# Patient Record
Sex: Female | Born: 1955 | Race: White | Hispanic: No | Marital: Married | Smoking: Never smoker
Health system: Southern US, Community
[De-identification: ages and names within clinical notes are randomized; demographics above are authoritative.]

## PROBLEM LIST (undated history)

## (undated) DIAGNOSIS — I8390 Asymptomatic varicose veins of unspecified lower extremity: Secondary | ICD-10-CM

## (undated) DIAGNOSIS — K5790 Diverticulosis of intestine, part unspecified, without perforation or abscess without bleeding: Secondary | ICD-10-CM

## (undated) DIAGNOSIS — T8859XA Other complications of anesthesia, initial encounter: Secondary | ICD-10-CM

## (undated) DIAGNOSIS — R112 Nausea with vomiting, unspecified: Secondary | ICD-10-CM

## (undated) DIAGNOSIS — F32A Depression, unspecified: Secondary | ICD-10-CM

## (undated) DIAGNOSIS — G4733 Obstructive sleep apnea (adult) (pediatric): Principal | ICD-10-CM

## (undated) DIAGNOSIS — Z9889 Other specified postprocedural states: Secondary | ICD-10-CM

## (undated) DIAGNOSIS — H9319 Tinnitus, unspecified ear: Secondary | ICD-10-CM

## (undated) DIAGNOSIS — M1990 Unspecified osteoarthritis, unspecified site: Secondary | ICD-10-CM

## (undated) DIAGNOSIS — K769 Liver disease, unspecified: Secondary | ICD-10-CM

## (undated) DIAGNOSIS — N3945 Continuous leakage: Secondary | ICD-10-CM

## (undated) DIAGNOSIS — K7581 Nonalcoholic steatohepatitis (NASH): Secondary | ICD-10-CM

## (undated) DIAGNOSIS — F329 Major depressive disorder, single episode, unspecified: Secondary | ICD-10-CM

## (undated) HISTORY — PX: CHOLECYSTECTOMY: SHX55

## (undated) HISTORY — DX: Continuous leakage: N39.45

## (undated) HISTORY — DX: Diverticulosis of intestine, part unspecified, without perforation or abscess without bleeding: K57.90

## (undated) HISTORY — DX: Nonalcoholic steatohepatitis (NASH): K75.81

## (undated) HISTORY — DX: Unspecified osteoarthritis, unspecified site: M19.90

## (undated) HISTORY — PX: BREAST BIOPSY: SHX20

## (undated) HISTORY — PX: OTHER SURGICAL HISTORY: SHX169

## (undated) HISTORY — DX: Tinnitus, unspecified ear: H93.19

## (undated) HISTORY — DX: Major depressive disorder, single episode, unspecified: F32.9

## (undated) HISTORY — DX: Obstructive sleep apnea (adult) (pediatric): G47.33

## (undated) HISTORY — DX: Depression, unspecified: F32.A

## (undated) HISTORY — DX: Asymptomatic varicose veins of unspecified lower extremity: I83.90

---

## 1999-11-13 ENCOUNTER — Other Ambulatory Visit: Admission: RE | Admit: 1999-11-13 | Discharge: 1999-11-13 | Payer: Self-pay | Admitting: Obstetrics & Gynecology

## 2000-12-09 ENCOUNTER — Other Ambulatory Visit: Admission: RE | Admit: 2000-12-09 | Discharge: 2000-12-09 | Payer: Self-pay | Admitting: Obstetrics & Gynecology

## 2001-03-02 ENCOUNTER — Encounter (INDEPENDENT_AMBULATORY_CARE_PROVIDER_SITE_OTHER): Payer: Self-pay | Admitting: Specialist

## 2001-03-02 ENCOUNTER — Encounter (INDEPENDENT_AMBULATORY_CARE_PROVIDER_SITE_OTHER): Payer: Self-pay | Admitting: *Deleted

## 2001-03-02 ENCOUNTER — Ambulatory Visit (HOSPITAL_COMMUNITY): Admission: RE | Admit: 2001-03-02 | Discharge: 2001-03-03 | Payer: Self-pay | Admitting: General Surgery

## 2002-01-10 ENCOUNTER — Other Ambulatory Visit: Admission: RE | Admit: 2002-01-10 | Discharge: 2002-01-10 | Payer: Self-pay | Admitting: Obstetrics & Gynecology

## 2002-03-10 ENCOUNTER — Encounter: Payer: Self-pay | Admitting: Orthopedic Surgery

## 2002-03-10 ENCOUNTER — Ambulatory Visit (HOSPITAL_COMMUNITY): Admission: RE | Admit: 2002-03-10 | Discharge: 2002-03-10 | Payer: Self-pay | Admitting: Orthopedic Surgery

## 2003-02-02 ENCOUNTER — Other Ambulatory Visit: Admission: RE | Admit: 2003-02-02 | Discharge: 2003-02-02 | Payer: Self-pay | Admitting: Obstetrics & Gynecology

## 2004-03-18 ENCOUNTER — Other Ambulatory Visit: Admission: RE | Admit: 2004-03-18 | Discharge: 2004-03-18 | Payer: Self-pay | Admitting: Obstetrics & Gynecology

## 2004-09-17 ENCOUNTER — Ambulatory Visit: Payer: Self-pay | Admitting: Internal Medicine

## 2004-12-04 ENCOUNTER — Ambulatory Visit: Payer: Self-pay | Admitting: Internal Medicine

## 2005-04-24 ENCOUNTER — Ambulatory Visit: Payer: Self-pay | Admitting: Internal Medicine

## 2005-07-01 ENCOUNTER — Ambulatory Visit: Payer: Self-pay | Admitting: Internal Medicine

## 2005-07-01 ENCOUNTER — Other Ambulatory Visit: Admission: RE | Admit: 2005-07-01 | Discharge: 2005-07-01 | Payer: Self-pay | Admitting: Obstetrics & Gynecology

## 2005-07-07 ENCOUNTER — Ambulatory Visit: Payer: Self-pay | Admitting: Internal Medicine

## 2005-08-21 ENCOUNTER — Emergency Department (HOSPITAL_COMMUNITY): Admission: EM | Admit: 2005-08-21 | Discharge: 2005-08-21 | Payer: Self-pay | Admitting: Emergency Medicine

## 2005-10-14 ENCOUNTER — Encounter: Admission: RE | Admit: 2005-10-14 | Discharge: 2005-10-14 | Payer: Self-pay | Admitting: Obstetrics & Gynecology

## 2005-10-15 ENCOUNTER — Encounter (INDEPENDENT_AMBULATORY_CARE_PROVIDER_SITE_OTHER): Payer: Self-pay | Admitting: *Deleted

## 2005-10-15 ENCOUNTER — Encounter: Admission: RE | Admit: 2005-10-15 | Discharge: 2005-10-15 | Payer: Self-pay | Admitting: Obstetrics & Gynecology

## 2006-01-05 ENCOUNTER — Ambulatory Visit: Payer: Self-pay | Admitting: Internal Medicine

## 2006-06-30 ENCOUNTER — Ambulatory Visit: Payer: Self-pay | Admitting: Internal Medicine

## 2006-07-07 ENCOUNTER — Ambulatory Visit: Payer: Self-pay | Admitting: Internal Medicine

## 2006-07-23 ENCOUNTER — Ambulatory Visit: Payer: Self-pay | Admitting: Gastroenterology

## 2006-08-11 ENCOUNTER — Ambulatory Visit: Payer: Self-pay | Admitting: Gastroenterology

## 2006-12-03 ENCOUNTER — Encounter: Admission: RE | Admit: 2006-12-03 | Discharge: 2006-12-03 | Payer: Self-pay | Admitting: Obstetrics & Gynecology

## 2007-06-24 ENCOUNTER — Telehealth: Payer: Self-pay | Admitting: Internal Medicine

## 2007-07-07 ENCOUNTER — Ambulatory Visit: Payer: Self-pay | Admitting: Internal Medicine

## 2007-07-07 DIAGNOSIS — I8310 Varicose veins of unspecified lower extremity with inflammation: Secondary | ICD-10-CM | POA: Insufficient documentation

## 2007-07-13 ENCOUNTER — Encounter: Payer: Self-pay | Admitting: Internal Medicine

## 2007-07-13 ENCOUNTER — Ambulatory Visit: Payer: Self-pay

## 2007-07-28 ENCOUNTER — Telehealth: Payer: Self-pay | Admitting: Internal Medicine

## 2007-09-02 ENCOUNTER — Ambulatory Visit: Payer: Self-pay | Admitting: Internal Medicine

## 2007-09-02 LAB — CONVERTED CEMR LAB
ALT: 33 units/L (ref 0–35)
AST: 27 units/L (ref 0–37)
Albumin: 4.4 g/dL (ref 3.5–5.2)
Alkaline Phosphatase: 57 units/L (ref 39–117)
Basophils Absolute: 0 10*3/uL (ref 0.0–0.1)
Basophils Relative: 0.5 % (ref 0.0–1.0)
Bilirubin, Direct: 0.2 mg/dL (ref 0.0–0.3)
Cholesterol: 166 mg/dL (ref 0–200)
Eosinophils Absolute: 0.1 10*3/uL (ref 0.0–0.6)
Eosinophils Relative: 2 % (ref 0.0–5.0)
HCT: 45.4 % (ref 36.0–46.0)
HDL: 71.9 mg/dL (ref >39.0)
Hemoglobin: 16 g/dL — ABNORMAL HIGH (ref 12.0–15.0)
LDL Cholesterol: 87 mg/dL (ref 0–99)
Lymphocytes Relative: 28.8 % (ref 12.0–46.0)
MCHC: 35.3 g/dL (ref 30.0–36.0)
MCV: 91.2 fL (ref 78.0–100.0)
Monocytes Absolute: 0.4 10*3/uL (ref 0.2–0.7)
Monocytes Relative: 9 % (ref 3.0–11.0)
Neutro Abs: 2.3 10*3/uL (ref 1.4–7.7)
Neutrophils Relative %: 59.7 % (ref 43.0–77.0)
Platelets: 164 10*3/uL (ref 150–400)
RBC: 4.97 M/uL (ref 3.87–5.11)
RDW: 11.9 % (ref 11.5–14.6)
Total Bilirubin: 1.3 mg/dL — ABNORMAL HIGH (ref 0.3–1.2)
Total CHOL/HDL Ratio: 2.3
Total Protein: 7.2 g/dL (ref 6.0–8.3)
Triglycerides: 38 mg/dL (ref 0–149)
VLDL: 8 mg/dL (ref 0–40)
WBC: 3.9 10*3/uL — ABNORMAL LOW (ref 4.5–10.5)

## 2007-09-10 ENCOUNTER — Ambulatory Visit: Payer: Self-pay | Admitting: Internal Medicine

## 2007-09-10 LAB — CONVERTED CEMR LAB: Vit D, 1,25-Dihydroxy: 49 (ref 30–89)

## 2007-09-27 LAB — CONVERTED CEMR LAB: Pap Smear: NORMAL

## 2007-09-29 ENCOUNTER — Encounter: Admission: RE | Admit: 2007-09-29 | Discharge: 2007-09-29 | Payer: Self-pay | Admitting: Internal Medicine

## 2007-12-16 ENCOUNTER — Encounter: Admission: RE | Admit: 2007-12-16 | Discharge: 2007-12-16 | Payer: Self-pay | Admitting: Obstetrics & Gynecology

## 2007-12-16 ENCOUNTER — Ambulatory Visit: Payer: Self-pay | Admitting: Internal Medicine

## 2007-12-16 DIAGNOSIS — R5381 Other malaise: Secondary | ICD-10-CM | POA: Insufficient documentation

## 2007-12-16 DIAGNOSIS — M1990 Unspecified osteoarthritis, unspecified site: Secondary | ICD-10-CM | POA: Insufficient documentation

## 2007-12-16 DIAGNOSIS — R5383 Other fatigue: Secondary | ICD-10-CM

## 2007-12-16 LAB — CONVERTED CEMR LAB
Basophils Absolute: 0 10*3/uL (ref 0.0–0.1)
Basophils Relative: 0.8 % (ref 0.0–1.0)
Eosinophils Absolute: 0.1 10*3/uL (ref 0.0–0.6)
Eosinophils Relative: 1.8 % (ref 0.0–5.0)
Folate: 18.5 ng/mL
HCT: 45.6 % (ref 36.0–46.0)
Hemoglobin: 15.4 g/dL — ABNORMAL HIGH (ref 12.0–15.0)
Lymphocytes Relative: 39 % (ref 12.0–46.0)
MCHC: 33.8 g/dL (ref 30.0–36.0)
MCV: 92.2 fL (ref 78.0–100.0)
Monocytes Absolute: 0.4 10*3/uL (ref 0.2–0.7)
Monocytes Relative: 7.8 % (ref 3.0–11.0)
Neutro Abs: 2.5 10*3/uL (ref 1.4–7.7)
Neutrophils Relative %: 50.6 % (ref 43.0–77.0)
Platelets: 148 10*3/uL — ABNORMAL LOW (ref 150–400)
RBC: 4.94 M/uL (ref 3.87–5.11)
RDW: 11.5 % (ref 11.5–14.6)
TSH: 3 microintl units/mL (ref 0.35–5.50)
Vitamin B-12: 430 pg/mL (ref 211–911)
WBC: 4.9 10*3/uL (ref 4.5–10.5)

## 2008-09-13 ENCOUNTER — Ambulatory Visit: Payer: Self-pay | Admitting: Internal Medicine

## 2008-09-13 LAB — CONVERTED CEMR LAB
ALT: 73 units/L — ABNORMAL HIGH (ref 0–35)
AST: 53 units/L — ABNORMAL HIGH (ref 0–37)
Albumin: 4.3 g/dL (ref 3.5–5.2)
Alkaline Phosphatase: 48 units/L (ref 39–117)
BUN: 18 mg/dL (ref 6–23)
Basophils Absolute: 0 10*3/uL (ref 0.0–0.1)
Basophils Relative: 0.7 % (ref 0.0–3.0)
Bilirubin Urine: NEGATIVE
Bilirubin, Direct: 0.2 mg/dL (ref 0.0–0.3)
Blood in Urine, dipstick: NEGATIVE
CO2: 28 meq/L (ref 19–32)
Calcium: 9.1 mg/dL (ref 8.4–10.5)
Chloride: 107 meq/L (ref 96–112)
Cholesterol: 137 mg/dL (ref 0–200)
Creatinine, Ser: 0.9 mg/dL (ref 0.4–1.2)
Eosinophils Absolute: 0.1 10*3/uL (ref 0.0–0.7)
Eosinophils Relative: 2 % (ref 0.0–5.0)
GFR calc Af Amer: 85 mL/min
GFR calc non Af Amer: 70 mL/min
Glucose, Bld: 80 mg/dL (ref 70–99)
Glucose, Urine, Semiquant: NEGATIVE
HCT: 42.6 % (ref 36.0–46.0)
HDL: 75 mg/dL (ref >39.0)
Hemoglobin: 15.3 g/dL — ABNORMAL HIGH (ref 12.0–15.0)
Ketones, urine, test strip: NEGATIVE
LDL Cholesterol: 49 mg/dL (ref 0–99)
Lymphocytes Relative: 42 % (ref 12.0–46.0)
MCHC: 35.9 g/dL (ref 30.0–36.0)
MCV: 92.4 fL (ref 78.0–100.0)
Monocytes Absolute: 0.3 10*3/uL (ref 0.1–1.0)
Monocytes Relative: 9.8 % (ref 3.0–12.0)
Neutro Abs: 1.6 10*3/uL (ref 1.4–7.7)
Neutrophils Relative %: 45.5 % (ref 43.0–77.0)
Nitrite: NEGATIVE
Platelets: 141 10*3/uL — ABNORMAL LOW (ref 150–400)
Potassium: 3.9 meq/L (ref 3.5–5.1)
Protein, U semiquant: NEGATIVE
RBC: 4.61 M/uL (ref 3.87–5.11)
RDW: 11.3 % — ABNORMAL LOW (ref 11.5–14.6)
Sodium: 141 meq/L (ref 135–145)
Specific Gravity, Urine: 1.02
TSH: 2.99 microintl units/mL (ref 0.35–5.50)
Total Bilirubin: 1.2 mg/dL (ref 0.3–1.2)
Total CHOL/HDL Ratio: 1.8
Total Protein: 7.1 g/dL (ref 6.0–8.3)
Triglycerides: 63 mg/dL (ref 0–149)
Urobilinogen, UA: 0.2
VLDL: 13 mg/dL (ref 0–40)
WBC Urine, dipstick: NEGATIVE
WBC: 3.5 10*3/uL — ABNORMAL LOW (ref 4.5–10.5)
pH: 6

## 2008-09-20 ENCOUNTER — Ambulatory Visit: Payer: Self-pay | Admitting: Internal Medicine

## 2008-09-28 ENCOUNTER — Telehealth: Payer: Self-pay | Admitting: Internal Medicine

## 2008-10-18 ENCOUNTER — Ambulatory Visit: Payer: Self-pay | Admitting: Internal Medicine

## 2008-10-31 LAB — CONVERTED CEMR LAB
ALT: 73 units/L — ABNORMAL HIGH (ref 0–35)
AST: 48 units/L — ABNORMAL HIGH (ref 0–37)
Albumin: 4.2 g/dL (ref 3.5–5.2)
Alkaline Phosphatase: 44 units/L (ref 39–117)
Bilirubin, Direct: 0.2 mg/dL (ref 0.0–0.3)
HCV Ab: NEGATIVE
Iron: 75 ug/dL (ref 42–145)
Saturation Ratios: 22.3 % (ref 20.0–50.0)
Total Bilirubin: 0.9 mg/dL (ref 0.3–1.2)
Total Protein: 6.9 g/dL (ref 6.0–8.3)
Transferrin: 240.2 mg/dL (ref >=212.0)

## 2008-11-16 ENCOUNTER — Ambulatory Visit: Payer: Self-pay | Admitting: Gastroenterology

## 2008-11-16 LAB — CONVERTED CEMR LAB
ALT: 105 units/L — ABNORMAL HIGH (ref 0–35)
AST: 75 units/L — ABNORMAL HIGH (ref 0–37)
Albumin: 4.6 g/dL (ref 3.5–5.2)
Alkaline Phosphatase: 52 units/L (ref 39–117)
Bilirubin, Direct: 0.3 mg/dL (ref 0.0–0.3)
INR: 1 (ref 0.8–1.0)
Prothrombin Time: 10.8 s — ABNORMAL LOW (ref 10.9–13.3)
Total Bilirubin: 1.9 mg/dL — ABNORMAL HIGH (ref 0.3–1.2)
Total Protein: 7.9 g/dL (ref 6.0–8.3)

## 2008-11-20 ENCOUNTER — Encounter: Admission: RE | Admit: 2008-11-20 | Discharge: 2008-11-20 | Payer: Self-pay | Admitting: Gastroenterology

## 2008-11-20 LAB — CONVERTED CEMR LAB
A-1 Antitrypsin, Ser: 79 mg/dL — ABNORMAL LOW (ref 83–200)
Angiotensin 1 Converting Enzyme: 44 units/L (ref 9–67)
Anti Nuclear Antibody(ANA): NEGATIVE
Ceruloplasmin: 41 mg/dL (ref 21–63)
Hepatitis B Surface Ag: NEGATIVE

## 2008-11-22 ENCOUNTER — Emergency Department (HOSPITAL_BASED_OUTPATIENT_CLINIC_OR_DEPARTMENT_OTHER): Admission: EM | Admit: 2008-11-22 | Discharge: 2008-11-22 | Payer: Self-pay | Admitting: Emergency Medicine

## 2008-11-30 ENCOUNTER — Ambulatory Visit: Payer: Self-pay | Admitting: Gastroenterology

## 2008-12-11 ENCOUNTER — Encounter: Payer: Self-pay | Admitting: Gastroenterology

## 2008-12-11 ENCOUNTER — Ambulatory Visit (HOSPITAL_COMMUNITY): Admission: RE | Admit: 2008-12-11 | Discharge: 2008-12-11 | Payer: Self-pay | Admitting: Gastroenterology

## 2008-12-11 ENCOUNTER — Encounter (INDEPENDENT_AMBULATORY_CARE_PROVIDER_SITE_OTHER): Payer: Self-pay | Admitting: Interventional Radiology

## 2008-12-15 ENCOUNTER — Telehealth: Payer: Self-pay | Admitting: Gastroenterology

## 2008-12-18 ENCOUNTER — Encounter: Admission: RE | Admit: 2008-12-18 | Discharge: 2008-12-18 | Payer: Self-pay | Admitting: Obstetrics & Gynecology

## 2008-12-21 ENCOUNTER — Encounter: Admission: RE | Admit: 2008-12-21 | Discharge: 2008-12-21 | Payer: Self-pay | Admitting: Obstetrics & Gynecology

## 2009-01-02 ENCOUNTER — Emergency Department (HOSPITAL_COMMUNITY): Admission: EM | Admit: 2009-01-02 | Discharge: 2009-01-02 | Payer: Self-pay | Admitting: Emergency Medicine

## 2009-01-02 ENCOUNTER — Telehealth: Payer: Self-pay | Admitting: Internal Medicine

## 2009-01-15 ENCOUNTER — Ambulatory Visit: Payer: Self-pay | Admitting: Gastroenterology

## 2009-01-15 DIAGNOSIS — K7689 Other specified diseases of liver: Secondary | ICD-10-CM | POA: Insufficient documentation

## 2009-01-16 ENCOUNTER — Ambulatory Visit: Payer: Self-pay | Admitting: Internal Medicine

## 2009-03-07 ENCOUNTER — Ambulatory Visit: Payer: Self-pay | Admitting: Internal Medicine

## 2009-03-08 LAB — CONVERTED CEMR LAB
ALT: 33 units/L (ref 0–35)
AST: 26 units/L (ref 0–37)
Albumin: 3.7 g/dL (ref 3.5–5.2)
Alkaline Phosphatase: 54 units/L (ref 39–117)
Bilirubin, Direct: 0.2 mg/dL (ref 0.0–0.3)
Total Bilirubin: 1.2 mg/dL (ref 0.3–1.2)
Total Protein: 7 g/dL (ref 6.0–8.3)

## 2009-03-14 ENCOUNTER — Ambulatory Visit: Payer: Self-pay | Admitting: Internal Medicine

## 2009-03-14 LAB — CONVERTED CEMR LAB: A-1 Antitrypsin, Ser: 99 mg/dL (ref 83–200)

## 2009-08-21 ENCOUNTER — Ambulatory Visit: Payer: Self-pay | Admitting: Family Medicine

## 2009-10-02 ENCOUNTER — Ambulatory Visit: Payer: Self-pay | Admitting: Internal Medicine

## 2009-10-02 DIAGNOSIS — J3089 Other allergic rhinitis: Secondary | ICD-10-CM | POA: Insufficient documentation

## 2009-10-12 LAB — CONVERTED CEMR LAB
ALT: 96 units/L — ABNORMAL HIGH (ref 0–35)
AST: 59 units/L — ABNORMAL HIGH (ref 0–37)
Albumin: 4.1 g/dL (ref 3.5–5.2)
Alkaline Phosphatase: 44 units/L (ref 39–117)
Bilirubin, Direct: 0.2 mg/dL (ref 0.0–0.3)
Cholesterol: 154 mg/dL (ref 0–200)
Direct LDL: 49.7 mg/dL
HDL: 80.8 mg/dL (ref >39.00)
Total Bilirubin: 1.3 mg/dL — ABNORMAL HIGH (ref 0.3–1.2)
Total Protein: 7.2 g/dL (ref 6.0–8.3)
Triglycerides: 37 mg/dL (ref 0.0–149.0)

## 2009-12-20 ENCOUNTER — Encounter: Admission: RE | Admit: 2009-12-20 | Discharge: 2009-12-20 | Payer: Self-pay | Admitting: Obstetrics & Gynecology

## 2009-12-24 LAB — CONVERTED CEMR LAB: Pap Smear: NORMAL

## 2009-12-25 ENCOUNTER — Ambulatory Visit: Payer: Self-pay | Admitting: Internal Medicine

## 2009-12-25 LAB — CONVERTED CEMR LAB
ALT: 56 units/L — ABNORMAL HIGH (ref 0–35)
AST: 27 units/L (ref 0–37)
Albumin: 3.9 g/dL (ref 3.5–5.2)
Alkaline Phosphatase: 54 units/L (ref 39–117)
Bilirubin, Direct: 0.2 mg/dL (ref 0.0–0.3)
Total Bilirubin: 1.2 mg/dL (ref 0.3–1.2)
Total Protein: 7.2 g/dL (ref 6.0–8.3)

## 2010-01-01 ENCOUNTER — Ambulatory Visit: Payer: Self-pay | Admitting: Internal Medicine

## 2010-01-01 DIAGNOSIS — L719 Rosacea, unspecified: Secondary | ICD-10-CM | POA: Insufficient documentation

## 2010-01-01 DIAGNOSIS — N3946 Mixed incontinence: Secondary | ICD-10-CM | POA: Insufficient documentation

## 2010-01-29 ENCOUNTER — Telehealth: Payer: Self-pay | Admitting: Internal Medicine

## 2010-04-17 ENCOUNTER — Ambulatory Visit: Payer: Self-pay | Admitting: Internal Medicine

## 2010-04-17 LAB — CONVERTED CEMR LAB
ALT: 70 units/L — ABNORMAL HIGH (ref 0–35)
AST: 47 units/L — ABNORMAL HIGH (ref 0–37)
Albumin: 4.4 g/dL (ref 3.5–5.2)
Alkaline Phosphatase: 42 units/L (ref 39–117)
Bilirubin, Direct: 0.2 mg/dL (ref 0.0–0.3)
Total Bilirubin: 1.1 mg/dL (ref 0.3–1.2)
Total Protein: 7.2 g/dL (ref 6.0–8.3)

## 2010-04-23 ENCOUNTER — Ambulatory Visit: Payer: Self-pay | Admitting: Internal Medicine

## 2010-05-20 ENCOUNTER — Telehealth: Payer: Self-pay | Admitting: Internal Medicine

## 2010-07-17 ENCOUNTER — Ambulatory Visit: Payer: Self-pay | Admitting: Internal Medicine

## 2010-07-17 LAB — CONVERTED CEMR LAB
ALT: 54 units/L — ABNORMAL HIGH (ref 0–35)
AST: 39 units/L — ABNORMAL HIGH (ref 0–37)
Albumin: 4 g/dL (ref 3.5–5.2)
Alkaline Phosphatase: 42 units/L (ref 39–117)
Bilirubin, Direct: 0.2 mg/dL (ref 0.0–0.3)
Total Bilirubin: 0.9 mg/dL (ref 0.3–1.2)
Total Protein: 6.4 g/dL (ref 6.0–8.3)

## 2010-07-23 ENCOUNTER — Ambulatory Visit: Payer: Self-pay | Admitting: Internal Medicine

## 2010-10-01 ENCOUNTER — Ambulatory Visit: Payer: Self-pay | Admitting: Internal Medicine

## 2010-10-01 LAB — CONVERTED CEMR LAB
ALT: 87 units/L — ABNORMAL HIGH (ref 0–35)
AST: 66 units/L — ABNORMAL HIGH (ref 0–37)
Albumin: 4.4 g/dL (ref 3.5–5.2)
Alkaline Phosphatase: 45 units/L (ref 39–117)
BUN: 21 mg/dL (ref 6–23)
Basophils Absolute: 0 10*3/uL (ref 0.0–0.1)
Basophils Relative: 1 % (ref 0.0–3.0)
Bilirubin Urine: NEGATIVE
Bilirubin, Direct: 0.2 mg/dL (ref 0.0–0.3)
Blood in Urine, dipstick: NEGATIVE
CO2: 27 meq/L (ref 19–32)
Calcium: 9.1 mg/dL (ref 8.4–10.5)
Chloride: 106 meq/L (ref 96–112)
Cholesterol: 164 mg/dL (ref 0–200)
Creatinine, Ser: 0.8 mg/dL (ref 0.4–1.2)
Eosinophils Absolute: 0.1 10*3/uL (ref 0.0–0.7)
Eosinophils Relative: 1.8 % (ref 0.0–5.0)
GFR calc non Af Amer: 76.06 mL/min (ref >60.00)
Glucose, Bld: 93 mg/dL (ref 70–99)
Glucose, Urine, Semiquant: NEGATIVE
HCT: 42.8 % (ref 36.0–46.0)
HDL: 83.6 mg/dL (ref >39.00)
Hemoglobin: 15 g/dL (ref 12.0–15.0)
Ketones, urine, test strip: NEGATIVE
LDL Cholesterol: 76 mg/dL (ref 0–99)
Lymphocytes Relative: 40.2 % (ref 12.0–46.0)
Lymphs Abs: 1.3 10*3/uL (ref 0.7–4.0)
MCHC: 35 g/dL (ref 30.0–36.0)
MCV: 93.6 fL (ref 78.0–100.0)
Monocytes Absolute: 0.3 10*3/uL (ref 0.1–1.0)
Monocytes Relative: 9.5 % (ref 3.0–12.0)
Neutro Abs: 1.6 10*3/uL (ref 1.4–7.7)
Neutrophils Relative %: 47.5 % (ref 43.0–77.0)
Nitrite: NEGATIVE
Platelets: 116 10*3/uL — ABNORMAL LOW (ref 150.0–400.0)
Potassium: 4.4 meq/L (ref 3.5–5.1)
Protein, U semiquant: NEGATIVE
RBC: 4.58 M/uL (ref 3.87–5.11)
RDW: 12.5 % (ref 11.5–14.6)
Sodium: 140 meq/L (ref 135–145)
Specific Gravity, Urine: 1.02
TSH: 2.41 microintl units/mL (ref 0.35–5.50)
Total Bilirubin: 1.3 mg/dL — ABNORMAL HIGH (ref 0.3–1.2)
Total CHOL/HDL Ratio: 2
Total Protein: 7.1 g/dL (ref 6.0–8.3)
Triglycerides: 20 mg/dL (ref 0.0–149.0)
Urobilinogen, UA: 0.2
VLDL: 4 mg/dL (ref 0.0–40.0)
WBC Urine, dipstick: NEGATIVE
WBC: 3.3 10*3/uL — ABNORMAL LOW (ref 4.5–10.5)
pH: 7

## 2010-10-09 ENCOUNTER — Ambulatory Visit: Payer: Self-pay | Admitting: Internal Medicine

## 2010-11-21 ENCOUNTER — Other Ambulatory Visit: Payer: Self-pay | Admitting: Obstetrics & Gynecology

## 2010-11-21 DIAGNOSIS — Z1239 Encounter for other screening for malignant neoplasm of breast: Secondary | ICD-10-CM

## 2010-11-26 NOTE — Op Note (Signed)
Summary: Laparoscopic cholecystectomy and pathology                    [place]  Patient:    TIAWANNA, Terry Bell                        MRN: [id number] Proc. Date: 03/02/01 Adm. Date:  [id number] Attending:  Glenna Fellows Tappan                           Operative Report  PREOPERATIVE DIAGNOSIS:  Symptomatic cholelithiasis.  POSTOPERATIVE DIAGNOSIS:  Symptomatic cholelithiasis.  OPERATION PERFORMED:  Laparoscopic cholecystectomy.  SURGEON:  Lorne Skeens. Hoxworth, M.D.  ASSISTANT:  Milus Mallick, M.D.  ANESTHESIA:  General.  INDICATIONS FOR PROCEDURE:  The patient is a 55 year old white female with history of typical episodes of epigastric and right upper quadrant abdominal pain and an ultrasound showing multiple gallstones.  She was felt to have symptomatic cholelithiasis.  Laparoscopic cholecystectomy has been recommended and accepted.  The nature of the procedure, its indications and risks of bleeding, infection, bile leak and bile duct injury were discussed and understood.  The patient is now brought to the operating room for this procedure.  DESCRIPTION OF PROCEDURE:  The patient was brought to the operating room and placed in supine position on the operating table and general endotracheal anesthesia was induced.  The abdomen was sterilely prepped and draped.  Broad spectrum antibiotics were given preoperatively.  Local anesthesia was used to infiltrate the trocar sites prior to the incisions.  A 1 cm incision was made at the umbilicus.  Dissection was carried down to the underlying fascia.  It was sharply incised for 1 cm and the peritoneum entered under direct vision. Through a mattress suture of 0 Vicryl, the Hasson trocar was placed and pneumoperitoneum established.  Under direct vision 10 mm trocar was placed in the subxiphoid area and two 5 mm trocars along the right subcostal margin. The fundus of the gallbladder was visualized and grasped and  elevated up from the liver and the infundibulum retracted inferolaterally.  Fibrofatty tissue was stripped off the neck of the gallbladder and the peritoneum incised on the anterior and posterior aspect of the distal gallbladder.  The distal gallbladder and Calots triangle was thoroughly dissected.  The cystic duct gallbladder junction was clearly identified and dissected free and the cystic duct clipped to the gallbladder junction.  Operative cholangiogram was attempted.  The cystic duct was very tiny and would not admit the catheter and this was abandoned.  The cystic duct was then doubly clipped proximally and divided.  Anterior posterior branch of the cystic artery were identified coursing up on the gallbladder wall and were divided between clips. The gallbladder was then dissected free from its bed using hook and spatula cautery.  There was some moderate thickening of the gallbladder wall.  The gallbladder was removed intact through the umbilicus with multiple large stones.  The operative site was irrigated and complete hemostasis assured. Trocars removed under direct vision and all CO2 evacuated from the peritoneal cavity.  The pursestring suture was secured at the umbilicus.  Skin incisions were closed with interrupted 4-0 Monocryl and Steri-Strips.  Sponge, needle and instrument counts were correct.  Dry sterile dressings were applied.  The patient was transferred to the recovery room in good condition. DD:  03/02/01 TD:  03/02/01 Job: [id number]  SP Surgical Pathology - STATUS: Final             ByDelila Spence MD , ERNEST A       Perform Date: 7 May02 11:55  Ordered ByJohna Sheriff MD , BENJAMIN        Ordered Date:  Facility: [facility]                              Department: CPATH  Service Report Text  [facility]   [address]   [place], [place] [address]   [phone]    REPORT OF SURGICAL PATHOLOGY    Case #: [id number]    Patient Name: Terry, Bell   PID: [id number]   Pathologist: Alden Server A. Delila Spence, MD   DOB/Age 06/08/1956 (Age: 55) Gender: F   Date Taken: 03/02/2001   Date Received: 03/02/2001    FINAL DIAGNOSIS    ***MICROSCOPIC EXAMINATION AND DIAGNOSIS***    GALLBLADDER: CHRONIC CHOLECYSTITIS AND CHOLELITHIASIS.    Lyn Hollingshead Delila Spence MD    tw   Date Reported: 03/03/2001 Lyn Hollingshead. Delila Spence, MD   *** Electronically Signed Out By EAA ***    Clinical information   CHOLELITHIASIS (JC)    specimen(s) obtained   GALLBLADDER    Gross Description   Size/?Intact: 6.2 x 3.5 x 2 cm gallbladder previously opened in   the lower portion.   Serosal surface: Smooth and hyperemic.   Mucosa/Wall: The mucosa is glistening tan-red and the wall   measures up to 0.5 cm in thickness.   Contents: Bile and several dark green calculi measuring 1.2 to   1.7 cm.   Cystic duct: 0.3 cm in length and patent.   Block Summary: One block submitted. GP/lg, 03/02/01    lg/

## 2010-11-26 NOTE — Progress Notes (Signed)
Summary: Amoxicillin for root canal  Phone Note Call from Patient Call back at Home Phone [phone] Call back at [phone]   Caller: Patient Call For: Stacie Glaze MD Reason for Call: Talk to Doctor Summary of Call: Triage VM from pt, she just had a root canal by Dr Aundria Rud.  He wanted her to be on Amoxicillin for 7 days prophlacticly.  Because of her medical hx of fatty liver disease, he wanted this med cleared and perhaps even ordered by Dr Lovell Sheehan. Initial call taken by: Sid Falcon LPN,  May 20, 2010 2:34 PM  Follow-up for Phone Call        ok per dr Lovell Sheehan- ampicillin 500 three times a day for 7 days Follow-up by: Willy Eddy, LPN,  May 20, 2010 3:15 PM    New/Updated Medications: AMPICILLIN 500 MG CAPS (AMPICILLIN) 1 three times a day for 7 days Prescriptions: AMPICILLIN 500 MG CAPS (AMPICILLIN) 1 three times a day for 7 days  #21 x 0   Entered by:   Willy Eddy, LPN   Authorized by:   Stacie Glaze MD   Signed by:   Willy Eddy, LPN on 09/81/1914   Method used:   Electronically to        [facility]. [phone]* (retail)       [address]       [place], [place]  [address]       Ph: [phone]       Fax: [phone]   RxID:   [id number]

## 2010-11-26 NOTE — Assessment & Plan Note (Signed)
Summary: 2 WEEK RECHECK/LD   History of Present Illness Visit Type: follow up Primary GI MD: Elie Goody MD [facility] Primary Provider: Darryll Capers, MD Requesting Provider: Darryll Capers, MD Chief Complaint: Patient here for f/u of elevated liver enzymes.  She recently had u/s and was advised that she needed a liver biopsy.  Patient is asymptomatic at this time. History of Present Illness:   Terry Bell returns with her husband today. Her alpha-1 antitrypsin level was slightly below the lower limit of normal at 79. Fatty liver was noted on ultrasound. She has a family history of hemochromatosis in an uncle, a sister with fatty liver disease and a mother with passed away with cryptogenic cirrhosis.   GI Review of Systems      Denies abdominal pain, acid reflux, belching, bloating, chest pain, dysphagia with liquids, dysphagia with solids, heartburn, loss of appetite, nausea, vomiting, vomiting blood, weight loss, and  weight gain.      Reports liver problems.     Denies anal fissure, black tarry stools, change in bowel habit, constipation, diarrhea, diverticulosis, fecal incontinence, heme positive stool, hemorrhoids, irritable bowel syndrome, jaundice, light color stool, rectal bleeding, and  rectal pain.   Prior Medications Reviewed Using: Patient Recall  Updated Prior Medication List: GLUCOSAMINE-CHONDROITIN 500-400 MG  TABS (GLUCOSAMINE-CHONDROITIN) once daily BL VITAMIN E 400 UNIT  CAPS (VITAMIN E) once daily CALCIUM 500/D 500-400 MG-UNIT  CHEW (CALCIUM-VITAMIN D) once daily VITAMIN D 1000 UNIT  CAPS (CHOLECALCIFEROL) 2 once daily PREMARIN 0.45 MG TABS (ESTROGENS CONJUGATED) 1 once daily PROMETRIUM 200 MG CAPS (PROGESTERONE MICRONIZED) 1 for 12 days every 3 months  Current Allergies (reviewed today): ! NIACIN Past Medical History:    Reviewed history from 11/15/2008 and no changes required:       Depression       varicose veins with inflamation       Osteoarthritis       torn  cartlige in rt knee       Diverticulosis  Past Surgical History:    Reviewed history from 11/16/2008 and no changes required:       Colonoscopy-08/11/2006       Cholecystectomy without IOC, 02/2001       Rt knee arthroscopy, 09/2008       Bilateral Bunionectomy       debridement and removal and lateral release of torn cartlidge in rt knee       Arthroscopic left knee surgery       Gum grafting, 08/2008  Family History:    Reviewed history from 11/16/2008 and no changes required:       Family History Diabetes 1st degree relative: Mother, Sister       Family History Hypertension       past hx of hemochromatosis       Family History of Liver Cancer: Uncle x 2       No FH of Colon Cancer:       Family History of Liver Disease/Cirrhosis: Mother(cirrhosis), Sister (Fatty Liver), Uncle (hemochromatosis)  Social History:    Reviewed history from 11/16/2008 and no changes required:       Married       Never Smoked       Alcohol use-yes-2-3 glasses per week       Drug use-no       Occupation: RN  Vital Signs:  Patient Profile:   55 Years Old Female Height:     67 inches Weight:  173.25 pounds BMI:     27.23 BSA:     1.90 Pulse rate:   72 / minute Pulse rhythm:   regular BP sitting:   120 / 80  (left arm)  Vitals Entered By: Terry Bell CMA (November 30, 2008 10:35 AM)                  Physical Exam  General:     Well developed, well nourished, no acute distress. Not reexamined today.   Impression & Recommendations:  Problem # 1:  TRANSAMINASES, SERUM, ELEVATED (ICD-790.4) Mildly elevated transaminases with a slightly low alpha-1 antitrypsin level. Rule out alpha-1 antitrypsin deficiency. Fatty infiltration of the liver on ultrasound. Family history of hemochromatosis however her DNA markers were negative. I recommend proceeding with ultrasound guided liver biopsy with special stains for alpha-1 antitrypsin deficiency and a quantitative tissue iron  level.  Patient Instructions: 1)  Terry Bell will contact you with a appt date and time for your Ultrasound guided liver biopsy.  2)  Please schedule a follow-up appointment in 4 to 6 weeks. 3)  Copy Sent To: Darryll Capers, MD  Appended Document: Orders Update    Clinical Lists Changes  Orders: Added new Referral order of CT/ULS Guided Liver Biospy (CT/ULS Guided Liv BX) - Signed

## 2010-11-26 NOTE — Assessment & Plan Note (Signed)
Summary: 2 month rov/njr   Vital Signs:  Patient profile:   55 year old female Height:      66.5 inches Weight:      170 pounds BMI:     27.13 Temp:     98.2 degrees F oral Pulse rate:   76 / minute Resp:     14 per minute BP sitting:   124 / 80  (left arm)  Vitals Entered By: Willy Eddy, LPN (Mar 14, 2009 10:15 AM)  Primary Care Provider:  Darryll Capers, MD  CC:  roa labs.  History of Present Illness: follow up on the abnormal dx  with the working diagnosis of fatty liver dz the pt has seen the gastroenterologist and he has concurred with the diagnosis although we are trying to moniter the enzymes   Follow-Up Visit      This is a 55 year old woman who presents for Follow-up visit.  The patient denies chest pain, palpitations, dizziness, syncope, low blood sugar symptoms, high blood sugar symptoms, edema, SOB, DOE, PND, and orthopnea.  Since the last visit the patient notes problems with medications.  The patient reports taking meds as prescribed and dietary compliance.  When questioned about possible medication side effects, the patient notes fatigue.    Problems Prior to Update: 1)  Acute Bronchitis  (ICD-466.0) 2)  Fatty Liver Disease  (ICD-571.8) 3)  Screening Colorectal-cancer  (ICD-V76.51) 4)  Transaminases, Serum, Elevated  (ICD-790.4) 5)  Osteoarthritis  (ICD-715.90) 6)  Other Malaise and Fatigue  (ICD-780.79) 7)  Preventive Health Care  (ICD-V70.0) 8)  Osteopenia  (ICD-733.90) 9)  Varicose Vein, 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) 10)  Depression  (ICD-311) 11)  Family History Diabetes 1st Degree Relative  (ICD-V18.0)  Medications Prior to Update: 1)  Glucosamine-Chondroitin 500-400 Mg  Tabs (Glucosamine-Chondroitin) .... Take 1 Tablet By Mouth Once A Day 2)  Bl Vitamin E 400 Unit  Caps (Vitamin E) .... Take 1 Tablet By Mouth Once A Day 3)  Calcium 500/d 500-400 Mg-Unit  Chew (Calcium-Vitamin D) .... Take 1 Tablet By Mouth Once A Day 4)  Vitamin D  1000 Unit  Caps (Cholecalciferol) .... Take 2 Tablets By Mouth Once Daily 5)  Premarin 0.45 Mg Tabs (Estrogens Conjugated) .... Take 1 Tablet By Mouth Once A Day 6)  Prometrium 200 Mg Caps (Progesterone Micronized) .Marland Kitchen.. 1 For 12 Days Every 3 Months 7)  Atuss Ds 30-4-30 Mg/75ml Susp (Pseudoephed Hcl-Cpm-Dm Hbr Tan) .... Two Ts Ppo Q 12 Hours  Current Medications (verified): 1)  Bl Vitamin E 400 Unit  Caps (Vitamin E) .... Take 1 Tablet By Mouth Once A Day 2)  Calcium 500/d 500-400 Mg-Unit  Chew (Calcium-Vitamin D) .... Take 1 Tablet By Mouth Once A Day 3)  Vitamin D 1000 Unit  Caps (Cholecalciferol) .... Take 2 Tablets By Mouth Once Daily 4)  Premarin 0.45 Mg Tabs (Estrogens Conjugated) .... Take 1 Tablet By Mouth Once A Day 5)  Prometrium 200 Mg Caps (Progesterone Micronized) .Marland Kitchen.. 1 For 12 Days Every 3 Months 6)  Atuss Ds 30-4-30 Mg/53ml Susp (Pseudoephed Hcl-Cpm-Dm Hbr Tan) .... Two Ts Ppo Q 12 Hours 7)  Glucosamine 1500 Complex  Caps (Glucosamine-Chondroit-Vit C-Mn) .... One By Mouth Daily  Allergies (verified): 1)  ! 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  Past Medical History:    Reviewed history from 01/15/2009 and no changes required:    Depression    varicose veins with inflamation    Osteoarthritis    torn cartlige in rt knee    Diverticulosis    Steatohepatitis  Past Surgical History:    Reviewed history from 11/16/2008 and no changes required:    Colonoscopy-08/11/2006    Cholecystectomy without IOC, 02/2001    Rt knee arthroscopy, 09/2008    Bilateral Bunionectomy    debridement and removal and lateral release of torn cartlidge in rt knee    Arthroscopic left knee surgery    Gum grafting, 08/2008  Family History:    Reviewed history from 11/16/2008 and no changes required:       Family History Diabetes 1st degree relative: Mother, Sister       Family History Hypertension       past hx of hemochromatosis       Family History of Liver Cancer: Uncle x 2       No FH of Colon Cancer:       Family History of Liver Disease/Cirrhosis: Mother(cirrhosis), Sister (Fatty Liver), Uncle (hemochromatosis)  Social History:    Reviewed history from 01/15/2009 and no changes required:       Married       Never Smoked       Alcohol use-stopped recently       Drug use-no       Occupation: Charity fundraiser  Review of Systems  The patient denies anorexia, fever, weight loss, weight gain, vision loss, decreased hearing, hoarseness, chest pain, syncope, dyspnea on exertion, peripheral edema, prolonged cough, headaches, hemoptysis, abdominal pain, melena, hematochezia, severe indigestion/heartburn, hematuria, incontinence, genital sores, muscle weakness, suspicious skin lesions, transient blindness, difficulty walking, depression, unusual weight change, abnormal bleeding, enlarged lymph nodes, angioedema, and breast masses.    Physical Exam  General:  Well developed, well nourished, no acute distress. not additional exam today Head:  Normocephalic and atraumatic. Ears:  Normal auditory  acuity. Nose:  no external deformity and no nasal discharge.   Mouth:  No deformity or lesions, dentition normal. Neck:  Supple; no masses or thyromegaly. Chest Wall:  No deformities, masses, or tenderness noted. Lungs:  Clear throughout to auscultation. Heart:  Regular rate and rhythm; no murmurs, rubs,  or bruits. Abdomen:  Soft, nontender and nondistended. No masses, hepatosplenomegaly or hernias noted. Normal bowel sounds.   Impression & Recommendations:  Problem # 1:  TRANSAMINASES, SERUM, ELEVATED (ICD-790.4) monitering for fatty liver dz with diet and weigth loss  being the primary intervention Orders: Venipuncture (16109) T-Alpha-1-Antitrypsin Tot [phone])  Problem # 2:  FATTY LIVER DISEASE (ICD-571.8) loos of five pounds and the liver enzymes have improved  Problem # 3:  VARICOSE VEIN, LWR EXTREMITIES W/INFLAMMATION (ICD-454.1) Assessment: Deteriorated stable  Problem # 4:  OTHER MALAISE AND FATIGUE (ICD-780.79) Assessment: Improved  Complete Medication List: 1)  Bl Vitamin E 400 Unit Caps (Vitamin e) .... Take 1 tablet by mouth once a day 2)  Calcium 500/d 500-400 Mg-unit Chew (Calcium-vitamin d) .... Take 1 tablet by mouth once a day 3)  Vitamin D 1000 Unit Caps (Cholecalciferol) .... Take 2 tablets by mouth once daily 4)  Premarin 0.45 Mg Tabs (Estrogens conjugated) .... Take 1 tablet by mouth once a day 5)  Prometrium 200 Mg Caps (Progesterone micronized) .Marland Kitchen.. 1 for 12 days every 3 months 6)  Atuss Ds 30-4-30 Mg/67ml Susp (Pseudoephed hcl-cpm-dm hbr tan) .... Two ts ppo q 12 hours 7)  Glucosamine 1500 Complex Caps (Glucosamine-chondroit-vit c-mn) .... One by mouth daily   Patient Instructions: 1)  Please schedule a follow-up appointment in 3 months. 2)  Hepatic Panel prior to visit, ICD-9: 790.4

## 2010-11-26 NOTE — Progress Notes (Signed)
Summary: vesicare & doxy  Phone Note From Pharmacy Call back at [phone]   Caller: vm Summary of Call: 1)Vesicare 5mg  sample daily did work.   2)Also we discussed doxycycline 50mg  daily rec by dermatologist worked well.  Would appreciate Dr. Shela Commons calling it in also so all Rxs can be managed by him.   Rxs to [facility]  Initial call taken by: Rudy Jew, RN,  January 29, 2010 9:38 AM    New/Updated Medications: DOXYCYCLINE HYCLATE 50 MG CAPS (DOXYCYCLINE HYCLATE) 1 once daily Prescriptions: DOXYCYCLINE HYCLATE 50 MG CAPS (DOXYCYCLINE HYCLATE) 1 once daily  #30 x 1   Entered by:   Willy Eddy, LPN   Authorized by:   Stacie Glaze MD   Signed by:   Willy Eddy, LPN on 46/96/2952   Method used:   Electronically to        [facility]. [phone]* (retail)       [address]       [place], [place]  [address]       Ph: [phone]       Fax: [phone]   RxID:   [id number] VESICARE 5 MG TABS (SOLIFENACIN SUCCINATE) one by mouth daily  #30 x 6   Entered by:   Willy Eddy, LPN   Authorized by:   Stacie Glaze MD   Signed by:   Willy Eddy, LPN on 60/63/0160   Method used:   Electronically to        [facility]. [phone]* (retail)       [address]       [place], [place]  [address]       Ph: [phone]       Fax: [phone]   RxID:   [id number]

## 2010-11-26 NOTE — Assessment & Plan Note (Signed)
Summary: 3 mo rov/mm/pt rescd from bump//ccm   Vital Signs:  Patient profile:   55 year old female Height:      66.5 inches Weight:      176 pounds BMI:     28.08 Temp:     98.2 degrees F oral Pulse rate:   72 / minute Resp:     14 per minute BP sitting:   130 / 80  (left arm)  Vitals Entered By: Willy Eddy, LPN (April 23, 2010 10:49 AM)  Nutrition Counseling: Patient's BMI is greater than 25 and therefore counseled on weight management options. CC: roa-labs   Primary Care Provider:  Darryll Capers, MD  CC:  roa-labs.  History of Present Illness: has not achieved weight loss this is the main factor   Follow-Up Visit      This is a 55 year old woman who presents for Follow-up visit.  The patient denies chest pain, palpitations, dizziness, syncope, low blood sugar symptoms, high blood sugar symptoms, edema, SOB, DOE, PND, and orthopnea.  Since the last visit the patient notes no new problems or concerns.  The patient reports taking meds as prescribed.  When questioned about possible medication side effects, the patient notes none.    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) 3)  Allergic Rhinitis Due To Other Allergen  (ICD-477.8) 4)  Viral Infection  (ICD-079.99) 5)  Acute Bronchitis  (ICD-466.0) 6)  Fatty Liver Disease  (ICD-571.8) 7)  Screening Colorectal-cancer  (ICD-V76.51) 8)  Transaminases, Serum, Elevated  (ICD-790.4) 9)  Osteoarthritis  (ICD-715.90) 10)  Other Malaise and Fatigue  (ICD-780.79) 11)  Preventive Health Care  (ICD-V70.0) 12)  Osteopenia  (ICD-733.90) 13)  Varicose Vein, 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) 14)  Depression  (ICD-311) 15)  Family History Diabetes 1st Degree Relative  (ICD-V18.0)  Medications Prior to Update: 1)  Bl Vitamin E 400  Unit  Caps (Vitamin E) .... Take 1 Tablet By Mouth Once A Day 2)  Calcium 500/d 500-400 Mg-Unit  Chew (Calcium-Vitamin D) .... Take 1 Tablet By Mouth Once A Day 3)  Vitamin D 1000 Unit  Caps (Cholecalciferol) .... Take 2 Tablets By Mouth Once Daily 4)  Premarin 0.45 Mg Tabs (Estrogens Conjugated) .... Take 1 Tablet By Mouth Once A Day 5)  Prometrium 200 Mg Caps (Progesterone Micronized) .Marland Kitchen.. 1 For 12 Days Every 3 Months 6)  Glucosamine 1500 Complex  Caps (Glucosamine-Chondroit-Vit C-Mn) .... One By Mouth Daily 7)  Fish Oil 1200 Mg Caps (Omega-3 Fatty Acids) .... Once Daily 8)  Nasonex 50 Mcg/act Susp (Mometasone Furoate) .... Two Spray Q Nare Daily 9)  Vesicare 5 Mg Tabs (Solifenacin Succinate) .... One By Mouth Daily 10)  Doxycycline Hyclate 50 Mg Caps (Doxycycline Hyclate) .Marland Kitchen.. 1 Once Daily  Current Medications (verified): 1)  Bl Vitamin E 400 Unit  Caps (Vitamin E) .... Take 1 Tablet By Mouth Once A Day 2)  Calcium 500/d 500-400 Mg-Unit  Chew (Calcium-Vitamin D) .... Take 1 Tablet By Mouth Once A Day 3)  Vitamin D 1000 Unit  Caps (Cholecalciferol) .... Take 2 Tablets By Mouth Once Daily 4)  Premarin 0.45 Mg Tabs (Estrogens Conjugated) .... Take 1 Tablet By Mouth Once A Day 5)  Prometrium 200 Mg Caps (Progesterone Micronized) .Marland Kitchen.. 1 For 12 Days Every  3 Months 6)  Glucosamine 1500 Complex  Caps (Glucosamine-Chondroit-Vit C-Mn) .... One By Mouth Daily 7)  Fish Oil 1200 Mg Caps (Omega-3 Fatty Acids) .... Once Daily 8)  Nasonex 50 Mcg/act Susp (Mometasone Furoate) .... Two Spray Q Nare Daily 9)  Vesicare 5 Mg Tabs (Solifenacin Succinate) .... One By Mouth Daily 10)  Doxycycline Hyclate 50 Mg Caps (Doxycycline Hyclate) .Marland Kitchen.. 1 Once Daily  Allergies (verified): 1)  ! Niacin  Past History:  Family History: Last updated: 11/16/2008 Family History Diabetes 1st degree relative: Mother, Sister Family History Hypertension past hx of hemochromatosis Family History of Liver Cancer: Uncle x 2 No  FH of Colon Cancer: Family History of Liver Disease/Cirrhosis: Mother(cirrhosis), Sister (Fatty Liver), Uncle (hemochromatosis)  Social History: Last updated: 01/15/2009 Married Never Smoked Alcohol use-stopped recently Drug use-no Occupation: RN  Risk Factors: Smoking Status: never (04/23/2010) Passive Smoke Exposure: no (04/23/2010)  Past medical, surgical, family and social histories (including risk factors) reviewed, and no changes noted (except as noted below).  Past Medical History: Reviewed history from 01/15/2009 and no changes required. Depression varicose veins with inflamation Osteoarthritis torn cartlige in rt knee Diverticulosis Steatohepatitis  Past Surgical History: Reviewed history from 11/16/2008 and no changes required. Colonoscopy-08/11/2006 Cholecystectomy without IOC, 02/2001 Rt knee arthroscopy, 09/2008 Bilateral Bunionectomy debridement and removal and lateral release of torn cartlidge in rt knee Arthroscopic left knee surgery Gum grafting, 08/2008  Family History: Reviewed history from 11/16/2008 and no changes required. Family History Diabetes 1st degree relative: Mother, Sister Family History Hypertension past hx of hemochromatosis Family History of Liver Cancer: Uncle x 2 No FH of Colon Cancer: Family History of Liver Disease/Cirrhosis: Mother(cirrhosis), Sister (Fatty Liver), Uncle (hemochromatosis)  Social History: Reviewed history from 01/15/2009 and no changes required. Married Never Smoked Alcohol use-stopped recently Drug use-no Occupation: Charity fundraiser  Review of Systems  The patient denies anorexia, fever, weight loss, weight gain, vision loss, decreased hearing, hoarseness, chest pain, syncope, dyspnea on exertion, peripheral edema, prolonged cough, headaches, hemoptysis, abdominal pain, melena, hematochezia, severe indigestion/heartburn, hematuria, incontinence, genital sores, muscle weakness, suspicious skin lesions, transient  blindness, difficulty walking, depression, unusual weight change, abnormal bleeding, enlarged lymph nodes, angioedema, and breast masses.    Physical Exam  General:  Well-developed,well-nourished,in no acute distress; alert,appropriate and cooperative throughout examination Head:  normocephalic and atraumatic.   Eyes:  pupils equal and pupils round.   Ears:  R ear normal and L ear normal.   Nose:  External nasal examination shows no deformity or inflammation. Nasal mucosa are pink and moist without lesions or exudates. Mouth:  Oral mucosa and oropharynx without lesions or exudates.  Teeth in good repair. Neck:  No deformities, masses, or tenderness noted. Lungs:  normal respiratory effort and no wheezes.   Heart:  normal rate, regular rhythm, and no murmur.     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 Take 1 tablet by mouth once a day 2)  Calcium 500/d 500-400 Mg-unit Chew (Calcium-vitamin d) .... Take 1 tablet by mouth once a day 3)  Vitamin D 1000 Unit Caps (Cholecalciferol) .... Take 2 tablets by mouth once daily 4)  Premarin 0.45 Mg Tabs (Estrogens conjugated) .... Take 1 tablet by mouth  once a day 5)  Prometrium 200 Mg Caps (Progesterone micronized) .Marland Kitchen.. 1 for 12 days every 3 months 6)  Glucosamine 1500 Complex Caps (Glucosamine-chondroit-vit c-mn) .... One by mouth daily 7)  Fish Oil 1200 Mg Caps (Omega-3 fatty acids) .... Once daily 8)  Nasonex 50 Mcg/act Susp (Mometasone furoate) .... Two spray q nare daily 9)  Vesicare 5 Mg Tabs (Solifenacin succinate) .... One by mouth daily 10)  Doxycycline Hyclate 50 Mg Caps (Doxycycline hyclate) .Marland Kitchen.. 1 once daily  Patient Instructions: 1)  Please  schedule a follow-up appointment in 3 months. 2)  Hepatic Panel prior to visit, ICD-9:995.20 Prescriptions: DOXYCYCLINE HYCLATE 50 MG CAPS (DOXYCYCLINE HYCLATE) 1 once daily  #30 x 1   Entered by:   Willy Eddy, LPN   Authorized by:   Stacie Glaze MD   Signed by:   Willy Eddy, LPN on 45/40/9811   Method used:   Electronically to        [facility]. [phone]* (retail)       [address]       [place], [place]  [address]       Ph: [phone]       Fax: [phone]   RxID:   [id number]

## 2010-11-26 NOTE — Assessment & Plan Note (Signed)
Summary: 3 mo rov/mm   Vital Signs:  Patient profile:   55 year old female Height:      66.5 inches Weight:      176 pounds BMI:     28.08 Temp:     98.2 degrees F oral Pulse rate:   72 / minute Resp:     14 per minute BP sitting:   124 / 80  (left arm)  Vitals Entered By: Willy Eddy, LPN (January 01, 1190 10:05 AM)  Nutrition Counseling: Patient's BMI is greater than 25 and therefore counseled on weight management options. CC: roa labs   Primary Care Provider:  Darryll Capers, MD  CC:  roa labs.  History of Present Illness: follow up of fatty liver with diet and alcohol changes new problems of mixed incontinace  with urger and frequency issues monitering of LFT and comparisom with prior on flow sheet review of meds suggested by GYN  Preventive Screening-Counseling & Management  Alcohol-Tobacco     Smoking Status: never     Passive Smoke Exposure: no  Current Problems (verified): 1)  Allergic Rhinitis Due To Other Allergen  (ICD-477.8) 2)  Viral Infection  (ICD-079.99) 3)  Acute Bronchitis  (ICD-466.0) 4)  Fatty Liver Disease  (ICD-571.8) 5)  Screening Colorectal-cancer  (ICD-V76.51) 6)  Transaminases, Serum, Elevated  (ICD-790.4) 7)  Osteoarthritis  (ICD-715.90) 8)  Other Malaise and Fatigue  (ICD-780.79) 9)  Preventive Health Care  (ICD-V70.0) 10)  Osteopenia  (ICD-733.90) 11)  Varicose Vein, 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 Take 1 Tablet By Mouth Once A Day 2)  Calcium 500/d 500-400 Mg-Unit  Chew (Calcium-Vitamin D) .... Take 1 Tablet By Mouth Once A Day 3)  Vitamin D 1000 Unit  Caps (Cholecalciferol) .... Take 2 Tablets By Mouth Once Daily 4)  Premarin 0.45 Mg Tabs (Estrogens Conjugated) .... Take 1 Tablet By Mouth Once A Day 5)  Prometrium 200 Mg Caps (Progesterone  Micronized) .Marland Kitchen.. 1 For 12 Days Every 3 Months 6)  Glucosamine 1500 Complex  Caps (Glucosamine-Chondroit-Vit C-Mn) .... One By Mouth Daily 7)  Fish Oil 1200 Mg Caps (Omega-3 Fatty Acids) .... Once Daily 8)  Nasonex 50 Mcg/act Susp (Mometasone Furoate) .... Two Spray Q Nare Daily  Allergies (verified): 1)  ! Niacin  Past History:  Family History: Last updated: 11/16/2008 Family History Diabetes 1st degree relative: Mother, Sister Family History Hypertension past hx of hemochromatosis Family History of Liver Cancer: Uncle x 2 No FH of Colon Cancer: Family History of Liver Disease/Cirrhosis: Mother(cirrhosis), Sister (Fatty Liver), Uncle (hemochromatosis)  Social History: Last updated: 01/15/2009 Married Never Smoked Alcohol use-stopped recently Drug use-no Occupation: RN  Risk Factors: Smoking Status: never (01/01/2010) Passive Smoke Exposure: no (01/01/2010)  Past medical, surgical, family and social histories (including risk factors) reviewed, and no changes noted (except as noted below).  Past Medical History: Reviewed history from 01/15/2009 and no changes required. Depression varicose veins with inflamation Osteoarthritis torn cartlige in rt knee Diverticulosis Steatohepatitis  Past Surgical History: Reviewed history from 11/16/2008 and no changes required. Colonoscopy-08/11/2006 Cholecystectomy without IOC, 02/2001 Rt knee arthroscopy, 09/2008 Bilateral Bunionectomy debridement and removal and lateral release of torn cartlidge in rt knee Arthroscopic left knee surgery Gum grafting, 08/2008  Family History: Reviewed history from 11/16/2008 and no changes required. Family History Diabetes 1st degree  relative: Mother, Sister Family History Hypertension past hx of hemochromatosis Family History of Liver Cancer: Uncle x 2 No FH of Colon Cancer: Family History of Liver Disease/Cirrhosis: Mother(cirrhosis), Sister (Fatty Liver), Uncle (hemochromatosis)  Social  History: Reviewed history from 01/15/2009 and no changes required. Married Never Smoked Alcohol use-stopped recently Drug use-no Occupation: Charity fundraiser  Review of Systems  The patient denies anorexia, fever, weight loss, weight gain, vision loss, decreased hearing, hoarseness, chest pain, syncope, dyspnea on exertion, peripheral edema, prolonged cough, headaches, hemoptysis, abdominal pain, melena, hematochezia, severe indigestion/heartburn, hematuria, incontinence, genital sores, muscle weakness, suspicious skin lesions, transient blindness, difficulty walking, depression, unusual weight change, abnormal bleeding, enlarged lymph nodes, angioedema, and breast masses.    Physical Exam  General:  Well-developed,well-nourished,in no acute distress; alert,appropriate and cooperative throughout examination Head:  normocephalic and atraumatic.   Eyes:  pupils equal and pupils round.   Ears:  R ear normal and L ear normal.   Nose:  External nasal examination shows no deformity or inflammation. Nasal mucosa are pink and moist without lesions or exudates. Mouth:  Oral mucosa and oropharynx without lesions or exudates.  Teeth in good repair. Neck:  No deformities, masses, or tenderness noted. Lungs:  normal respiratory effort and no wheezes.   Heart:  normal rate, regular rhythm, and no murmur.     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 Take 1 tablet by mouth once a day 2)  Calcium 500/d 500-400 Mg-unit Chew (Calcium-vitamin d) .... Take 1 tablet by mouth once a day 3)  Vitamin D 1000 Unit Caps (Cholecalciferol) .... Take 2 tablets by mouth once daily 4)  Premarin 0.45 Mg Tabs (Estrogens conjugated) .... Take 1 tablet by mouth once a day 5)  Prometrium 200 Mg Caps (Progesterone micronized) .Marland Kitchen.. 1 for 12 days every 3 months 6)  Glucosamine  1500 Complex Caps (Glucosamine-chondroit-vit c-mn) .... One by mouth daily 7)  Fish Oil 1200 Mg Caps (Omega-3 fatty acids) .... Once daily 8)  Nasonex 50 Mcg/act Susp (Mometasone furoate) .... Two spray q nare daily 9)  Vesicare 5 Mg Tabs (Solifenacin succinate) .... One by mouth daily  Patient Instructions: 1)  weigth goals not met 2)  the alcohol consumption seens stable at this point 3)  new goal is a minimum of 5 pounds goal of 10 4)  Please schedule a follow-up appointment in 3 months. 5)  Hepatic Panel prior to visit, ICD-9:790.4 6)  current data does not support that transdermal estrogen is safer than oral for the liver 7)  pelvic floor  and kegal exercize

## 2010-11-26 NOTE — Assessment & Plan Note (Signed)
Summary: 3 MONTH FUP//CCM   Vital Signs:  Patient profile:   55 year old female Height:      66.5 inches Weight:      172 pounds BMI:     27.44 Temp:     98.2 degrees F oral Pulse rate:   72 / minute Pulse rhythm:   regular Resp:     14 per minute BP sitting:   124 / 80  (left arm)  Vitals Entered By: Willy Eddy, LPN (July 23, 2010 9:14 AM) CC: roa labs Is Patient Diabetic? No   Primary Care Provider:  Darryll Capers, MD  CC:  roa labs.  History of Present Illness: follow up for elevations of liver functions and fatty liver dz   Follow-Up Visit      This is a 55 year old woman who presents for Follow-up visit.  The patient denies chest pain, palpitations, dizziness, syncope, low blood sugar symptoms, high blood sugar symptoms, edema, SOB, DOE, PND, and orthopnea.  Since the last visit the patient notes no new problems or concerns.  The patient reports taking meds as prescribed.  When questioned about possible medication side effects, the patient notes none.    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) 3)  Allergic Rhinitis Due To Other Allergen  (ICD-477.8) 4)  Viral Infection  (ICD-079.99) 5)  Acute Bronchitis  (ICD-466.0) 6)  Fatty Liver Disease  (ICD-571.8) 7)  Screening Colorectal-cancer  (ICD-V76.51) 8)  Transaminases, Serum, Elevated  (ICD-790.4) 9)  Osteoarthritis  (ICD-715.90) 10)  Other Malaise and Fatigue  (ICD-780.79) 11)  Preventive Health Care  (ICD-V70.0) 12)  Osteopenia  (ICD-733.90) 13)  Varicose Vein, 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) 14)  Depression  (ICD-311) 15)  Family History Diabetes 1st Degree Relative  (ICD-V18.0)  Current Problems (verified): 1)  Acne Rosacea   (ICD-695.3) 2)  Mixed Incontinence Urge and Stress  (ICD-788.33) 3)  Allergic Rhinitis Due To Other Allergen  (ICD-477.8) 4)  Viral Infection  (ICD-079.99) 5)  Acute Bronchitis  (ICD-466.0) 6)  Fatty Liver Disease  (ICD-571.8) 7)  Screening Colorectal-cancer  (ICD-V76.51) 8)  Transaminases, Serum, Elevated  (ICD-790.4) 9)  Osteoarthritis  (ICD-715.90) 10)  Other Malaise and Fatigue  (ICD-780.79) 11)  Preventive Health Care  (ICD-V70.0) 12)  Osteopenia  (ICD-733.90) 13)  Varicose Vein, 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 Take 1 Tablet By Mouth Once A Day 2)  Calcium 500/d 500-400 Mg-Unit  Chew (Calcium-Vitamin D) .... Take 1 Tablet By Mouth Once A Day 3)  Vitamin D 1000 Unit  Caps (Cholecalciferol) .... Take 2 Tablets By Mouth Once Daily 4)  Premarin 0.45 Mg Tabs (Estrogens Conjugated) .... Take 1 Tablet By Mouth Once A Day 5)  Prometrium 200 Mg Caps (Progesterone Micronized) .Marland Kitchen.. 1 For 12 Days Every 3 Months 6)  Glucosamine 1500 Complex  Caps (Glucosamine-Chondroit-Vit C-Mn) .... One By Mouth Daily 7)  Fish Oil 1200 Mg Caps (Omega-3 Fatty Acids) .... Once Daily 8)  Nasonex 50 Mcg/act Susp (Mometasone Furoate) .... Two Spray Q Nare Daily 9)  Vesicare 5 Mg Tabs (Solifenacin Succinate) .... One By Mouth Daily 10)  Doxycycline Hyclate 50 Mg Caps (Doxycycline Hyclate) .Marland Kitchen.. 1 Once Daily 11)  Ampicillin 500 Mg Caps (Ampicillin) .Marland Kitchen.. 1 Three Times A Day For 7 Days  Current Medications (verified): 1)  Bl Vitamin E 400 Unit  Caps (Vitamin E) .... Take 1 Tablet By Mouth Once A Day 2)  Calcium 500/d 500-400 Mg-Unit  Chew (Calcium-Vitamin D) .... Take 1 Tablet By Mouth Once A Day 3)  Vitamin D 1000 Unit  Caps (Cholecalciferol) .... Take 2 Tablets By Mouth Once Daily 4)  Premarin 0.45 Mg Tabs (Estrogens Conjugated) .... Take  1 Tablet By Mouth Once A Day 5)  Prometrium 200 Mg Caps (Progesterone Micronized) .Marland Kitchen.. 1 For 12 Days Every 3 Months 6)  Glucosamine 1500 Complex  Caps (Glucosamine-Chondroit-Vit C-Mn) .... One By Mouth Daily 7)  Fish Oil 1200 Mg Caps (Omega-3 Fatty Acids) .... Once Daily 8)  Nasonex 50 Mcg/act Susp (Mometasone Furoate) .... Two Spray Q Nare Daily 9)  Vesicare 5 Mg Tabs (Solifenacin Succinate) .... One By Mouth Daily 10)  Doxycycline Hyclate 50 Mg Caps (Doxycycline Hyclate) .Marland Kitchen.. 1 Once Daily  Allergies (verified): 1)  ! Niacin  Past History:  Family History: Last updated: 11/16/2008 Family History Diabetes 1st degree relative: Mother, Sister Family History Hypertension past hx of hemochromatosis Family History of Liver Cancer: Uncle x 2 No FH of Colon Cancer: Family History of Liver Disease/Cirrhosis: Mother(cirrhosis), Sister (Fatty Liver), Uncle (hemochromatosis)  Social History: Last updated: 01/15/2009 Married Never Smoked Alcohol use-stopped recently Drug use-no Occupation: RN  Risk Factors: Smoking Status: never (07/23/2010) Passive Smoke Exposure: no (07/23/2010)  Past medical, surgical, family and social histories (including risk factors) reviewed, and no changes noted (except as noted below).  Past Medical History: Reviewed history from 01/15/2009 and no changes required. Depression varicose veins with inflamation Osteoarthritis torn cartlige in rt knee Diverticulosis Steatohepatitis  Past Surgical History: Reviewed history from 11/16/2008 and no changes required. Colonoscopy-08/11/2006 Cholecystectomy without IOC, 02/2001 Rt knee arthroscopy, 09/2008 Bilateral Bunionectomy debridement and removal and lateral release of torn cartlidge in rt knee Arthroscopic left knee surgery Gum grafting, 08/2008  Family History: Reviewed history from 11/16/2008 and no changes required. Family History Diabetes 1st degree relative: Mother, Sister Family History  Hypertension past hx of hemochromatosis Family History of Liver Cancer: Uncle x 2 No FH of Colon Cancer: Family History of Liver Disease/Cirrhosis: Mother(cirrhosis), Sister (Fatty Liver), Uncle (hemochromatosis)  Social History: Reviewed history from 01/15/2009 and no changes required. Married Never Smoked Alcohol use-stopped recently Drug use-no Occupation: Charity fundraiser  Review of Systems  The patient denies anorexia, fever, weight loss, weight gain, vision loss, decreased hearing, hoarseness, chest pain, syncope, dyspnea on exertion, peripheral edema, prolonged cough, headaches, hemoptysis, abdominal pain, melena, hematochezia, severe indigestion/heartburn, hematuria, incontinence, genital sores, muscle weakness, suspicious skin lesions, transient blindness, difficulty walking, depression, unusual weight change, abnormal bleeding, enlarged lymph nodes, angioedema, and breast masses.    Physical Exam  General:  Well-developed,well-nourished,in no acute distress; alert,appropriate and cooperative throughout examination Head:  normocephalic and atraumatic.   Eyes:  pupils equal and pupils round.   Ears:  R ear normal and L ear normal.   Nose:  External nasal examination shows no deformity or inflammation. Nasal mucosa are pink and moist without lesions or exudates. Mouth:  Oral mucosa and oropharynx without lesions or exudates.  Teeth in good repair. Neck:  No deformities, masses, or tenderness noted. Lungs:  normal respiratory effort and no  wheezes.   Heart:  normal rate, regular rhythm, and no murmur.   Abdomen:  Bowel sounds positive,abdomen soft and non-tender without masses, organomegaly or hernias noted. Msk:  No deformity or scoliosis noted of thoracic or lumbar spine.   Pulses:  R and L carotid,radial,femoral,dorsalis pedis and posterior tibial pulses are full and equal bilaterally Extremities:  No clubbing, cyanosis, edema, or deformity noted with normal full range of motion of all  joints.   Neurologic:  No cranial nerve deficits noted. Station and gait are normal. Plantar reflexes are down-going bilaterally. DTRs are symmetrical throughout. Sensory, motor and coordinative functions appear intact.   Impression & Recommendations:  Problem # 1:  FATTY LIVER DISEASE (ICD-571.8) Assessment Unchanged the pt has improved and decreased wine  Problem # 2:  OTHER MALAISE AND FATIGUE (ICD-780.79) Assessment: Deteriorated has not lost weigth and has not exercize  Problem # 3:  MIXED INCONTINENCE URGE AND STRESS (ICD-788.33) Assessment: Unchanged on vesicaer with good results  Problem # 4:  OSTEOARTHRITIS (ICD-715.90) Assessment: Unchanged  Discussed use of medications, application of heat or cold, and exercises.   Complete Medication List: 1)  Bl Vitamin E 400 Unit Caps (Vitamin e) .... Take 1 tablet by mouth once a day 2)  Calcium 500/d 500-400 Mg-unit Chew (Calcium-vitamin d) .... Take 1 tablet by mouth once a day 3)  Vitamin D 1000 Unit Caps (Cholecalciferol) .... Take 2 tablets by mouth once daily 4)  Premarin 0.45 Mg Tabs (Estrogens conjugated) .... Take 1 tablet by mouth once a day 5)  Prometrium 200 Mg Caps (Progesterone micronized) .Marland Kitchen.. 1 for 12 days every 3 months 6)  Glucosamine 1500 Complex Caps (Glucosamine-chondroit-vit c-mn) .... One by mouth daily 7)  Fish Oil 1200 Mg Caps (Omega-3 fatty acids) .... Once daily 8)  Nasonex 50 Mcg/act Susp (Mometasone furoate) .... Two spray q nare daily 9)  Vesicare 5 Mg Tabs (Solifenacin succinate) .... One by mouth daily 10)  Doxycycline Hyclate 50 Mg Caps (Doxycycline hyclate) .Marland Kitchen.. 1 once daily  Patient Instructions: 1)  Please schedule a follow-up appointment in 3 months. for CPX Prescriptions: VESICARE 5 MG TABS (SOLIFENACIN SUCCINATE) one by mouth daily  #30 x 6   Entered by:   Willy Eddy, LPN   Authorized by:   Stacie Glaze MD   Signed by:   Willy Eddy, LPN on 54/06/8118   Method used:    Electronically to        [facility]. [phone]* (retail)       [address]       [place], [place]  [address]       Ph: [phone]       Fax: [phone]   RxID:   [id number] DOXYCYCLINE HYCLATE 50 MG CAPS (DOXYCYCLINE HYCLATE) 1 once daily  #30 x 6   Entered by:   Willy Eddy, LPN   Authorized by:   Stacie Glaze MD   Signed by:   Willy Eddy, LPN on 47/42/5956   Method used:   Electronically to        [facility]. [phone]* (retail)       [address]       [place], [place]  [address]       Ph: [phone]       Fax: [phone]   RxID:   [id number]

## 2010-11-26 NOTE — Assessment & Plan Note (Signed)
Summary: head congestion,cough, no fever today/jls   Vital Signs:  Patient profile:   55 year old female Height:      66.5 inches Weight:      156 pounds BMI:     24.89 Temp:     99.8 degrees F oral Pulse rate:   88 / minute Resp:     14 per minute BP sitting:   130 / 80  (left arm)  Vitals Entered By: Willy Eddy, LPN (January 16, 2009 3:31 PM)  Primary Care Provider:  Darryll Capers, MD  CC:  c/o nasal congestion and cough.  History of Present Illness: The pt has fatty liver dx without iron overload or evidence ofautoimmune liver dz at present Not calling NASH Weight loss  Current Medications (verified): 1)  Glucosamine-Chondroitin 500-400 Mg  Tabs (Glucosamine-Chondroitin) .... Take 1 Tablet By Mouth Once A Day 2)  Bl Vitamin E 400 Unit  Caps (Vitamin E) .... Take 1 Tablet By Mouth Once A Day 3)  Calcium 500/d 500-400 Mg-Unit  Chew (Calcium-Vitamin D) .... Take 1 Tablet By Mouth Once A Day 4)  Vitamin D 1000 Unit  Caps (Cholecalciferol) .... Take 2 Tablets By Mouth Once Daily 5)  Premarin 0.45 Mg Tabs (Estrogens Conjugated) .... Take 1 Tablet By Mouth Once A Day 6)  Prometrium 200 Mg Caps (Progesterone Micronized) .Marland Kitchen.. 1 For 12 Days Every 3 Months  Allergies (verified): 1)  ! 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  Past Medical History:    Reviewed history from 01/15/2009 and no changes required:    Depression    varicose veins with inflamation    Osteoarthritis    torn cartlige in rt knee    Diverticulosis    Steatohepatitis  Past Surgical History:    Reviewed history from 11/16/2008 and no changes required:    Colonoscopy-08/11/2006    Cholecystectomy without IOC, 02/2001    Rt knee arthroscopy, 09/2008    Bilateral Bunionectomy    debridement and removal and lateral release of torn cartlidge in rt knee    Arthroscopic left knee surgery    Gum grafting, 08/2008  Family History:    Reviewed history from 11/16/2008 and no changes required:       Family History Diabetes 1st degree relative: Mother, Sister       Family History Hypertension       past  hx of hemochromatosis       Family History of Liver Cancer: Uncle x 2       No FH of Colon Cancer:       Family History of Liver Disease/Cirrhosis: Mother(cirrhosis), Sister (Fatty Liver), Uncle (hemochromatosis)  Social History:    Reviewed history from 01/15/2009 and no changes required:       Married       Never Smoked       Alcohol use-stopped recently       Drug use-no       Occupation: Charity fundraiser  Review of Systems  The patient denies anorexia, fever, weight loss, weight gain, vision loss, decreased hearing, hoarseness, chest pain, syncope, dyspnea on exertion, peripheral edema, prolonged cough, headaches, hemoptysis, abdominal pain, melena, hematochezia, severe indigestion/heartburn, hematuria, incontinence, genital sores, muscle weakness, suspicious skin lesions, transient blindness, difficulty walking, depression, unusual weight change, abnormal bleeding, enlarged lymph nodes, angioedema, and breast  masses.    Physical Exam  General:  Well developed, well nourished, no acute distress. not additional exam today Head:  Normocephalic and atraumatic. Ears:  Normal auditory acuity. Nose:  no external deformity and no nasal discharge.   Mouth:  No deformity or lesions, dentition normal. Neck:  Supple; no masses or thyromegaly. Lungs:  Clear throughout to auscultation. Heart:  Regular rate and rhythm; no murmurs, rubs,  or bruits. Abdomen:  Soft, nontender and nondistended. No masses, hepatosplenomegaly or hernias noted. Normal bowel sounds.   Impression & Recommendations:  Problem # 1:  FATTY LIVER DISEASE (ICD-571.8) DASH weigfht loss with goal of BMI of 25  Problem # 2:  TRANSAMINASES, SERUM, ELEVATED (ICD-790.4) due to medications  Problem # 3:  ACUTE BRONCHITIS (ICD-466.0)  Take antibiotics and other medications as directed. Encouraged to push clear liquids, get enough rest, and take acetaminophen as needed. To be seen in 5-7 days if no improvement, sooner if worse.  Her updated medication list for this problem includes:    Atuss Ds 30-4-30 Mg/44ml Susp (Pseudoephed hcl-cpm-dm hbr tan) .Marland Kitchen..Marland Kitchen Two ts ppo q 12 hours  Complete Medication List: 1)  Bl Vitamin E 400 Unit Caps (Vitamin e) .... Take 1 tablet by mouth once a day 2)  Calcium 500/d 500-400 Mg-unit Chew (Calcium-vitamin d) .... Take 1 tablet by mouth once a day 3)  Vitamin D 1000 Unit Caps (Cholecalciferol) .... Take 2 tablets by mouth once daily 4)  Premarin 0.45 Mg Tabs (Estrogens conjugated) .... Take 1 tablet by mouth once a day 5)  Prometrium 200 Mg Caps (Progesterone micronized) .Marland Kitchen.. 1 for 12 days every 3 months 6)  Atuss Ds 30-4-30 Mg/7ml Susp (Pseudoephed hcl-cpm-dm hbr tan) .... Two ts ppo q 12 hours  Patient Instructions: 1)  DASH diet 2)  [email or web address] 3)  our weight goal is 167 with a BMI of 25 is our goal 4)  Please schedule a follow-up appointment in 2 months. 5)  Hepatic Panel prior to visit,  ICD-9:790.4 Prescriptions: ATUSS DS 30-4-30 MG/5ML SUSP (PSEUDOEPHED HCL-CPM-DM HBR TAN) two ts ppo q 12 hours  #6 oz x 1   Entered and Authorized by:   Stacie Glaze MD   Signed by:   Stacie Glaze MD on 01/16/2009   Method used:   Electronically to        [facility]. #[id number]* (retail)       [address]       [place], [place]  [address]       Ph: [phone]       Fax: [phone]   RxID:   [id number] AMOXICILLIN 500 MG CAP (AMOXICILLIN) Take 1 capsule by mouth three times a day X 10 days  #30 x 0   Entered and Authorized by:   Stacie Glaze MD   Signed by:   Stacie Glaze MD on 01/16/2009   Method used:   Electronically to        [facility]. [phone]* (retail)       [address]       [place], [place]  [address]       Ph: [phone]       Fax: [phone]   RxID:   [id number]

## 2010-11-26 NOTE — Assessment & Plan Note (Signed)
Summary: ELEVATED LIVER FUNCTIONS/FH   History of Present Illness Visit Type: consult Primary GI MD: Elie Goody MD [facility] Primary Provider: Darryll Capers, MD Requesting Provider: Darryll Capers, MD Chief Complaint: Patient here for further evaluation of elevated liver function tests.  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 She was recently found to have mildly elevated liver function tests by Dr. Lovell Sheehan is referred for further evaluation. Her mother died of idiopathic cirrhosis and an uncle has hemochromatosis. Recent hemochromatosis genotype studies were negative. Hepatitis C and iron studies were also negative. Mildly elevated transaminases were noted in December 2009 with AST of 53 and ALT was 73. Repeat studies showed an AST of 48 and an ALT of 73. she has no prior history of liver disease or abnormal liver function tests. There is no history of blood transfusions, IV needle sticks, tattoos, exposure to persons with known hepatitis or jaundice. She denies any recent medication changes except for discontinuing Alli in 08/2008 after taking it twice daily for about 6-9 months.   GI Review of Systems      Denies abdominal pain, acid reflux, belching, bloating, chest pain, dysphagia with liquids, dysphagia with solids, heartburn, loss of appetite, nausea, vomiting, vomiting blood, weight loss, and  weight gain.      Reports liver problems.     Denies anal fissure, black tarry stools, change in bowel habit, constipation, diarrhea, diverticulosis, fecal incontinence, heme positive stool, hemorrhoids, irritable bowel syndrome, jaundice, light color stool, rectal bleeding, and  rectal pain.    Prior Medications Reviewed Using: Patient Recall  Updated Prior Medication List: GLUCOSAMINE-CHONDROITIN 500-400 MG  TABS (GLUCOSAMINE-CHONDROITIN) once daily BL VITAMIN E 400 UNIT  CAPS (VITAMIN E) once daily CALCIUM 500/D 500-400  MG-UNIT  CHEW (CALCIUM-VITAMIN D) once daily VITAMIN D 1000 UNIT  CAPS (CHOLECALCIFEROL) 2 once daily PREMARIN 0.45 MG TABS (ESTROGENS CONJUGATED) 1 once daily PROMETRIUM 200 MG CAPS (PROGESTERONE MICRONIZED) 1 for 12 days every 3 months  Current Allergies (reviewed today): No known allergies  Past Medical History:    Reviewed history from 11/15/2008 and no changes required:       Depression       varicose veins with inflamation       Osteoarthritis       torn cartlige in rt knee       Diverticulosis  Past Surgical History:    Colonoscopy-08/11/2006    Cholecystectomy without IOC, 02/2001    Rt knee arthroscopy, 09/2008    Bilateral Bunionectomy    debridement and removal and lateral release of torn cartlidge in rt knee    Arthroscopic left knee surgery    Gum grafting, 08/2008  Family History:    Reviewed history from 09/20/2008 and no changes required:       Family History Diabetes 1st degree relative: Mother, Sister       Family History Hypertension       past hx of hemochromatosis       Family History of Liver Cancer: Uncle x 2       No FH of Colon Cancer:       Family History of Liver Disease/Cirrhosis: Mother(cirrhosis), Sister (Fatty Liver), Uncle (hemochromatosis)  Social History:    Reviewed history from 06/02/2007 and no changes required:       Married       Never Smoked       Alcohol use-yes-2-3 glasses per week  Drug use-no       Occupation: RN   Review of Systems       The patient complains of arthritis/joint pain and fatigue.         The pertinent positives and negatives are noted as above and in the HPI. All other ROS were negative.   Vital Signs:  Patient Profile:   55 Years Old Female Height:     67 inches Weight:      174.38 pounds BMI:     27.41 BSA:     1.91 Pulse rate:   76 / minute Pulse rhythm:   regular BP sitting:   122 / 88  (left arm)  Vitals Entered By: Hortense Ramal CMA (November 16, 2008 10:11 AM)                   Physical Exam  General:     Well developed, well nourished, no acute distress. Head:     Normocephalic and atraumatic. Eyes:     PERRLA, no icterus. Ears:     Normal auditory acuity. Mouth:     No deformity or lesions, dentition normal. Neck:     Supple; no masses or thyromegaly. Lungs:     Clear throughout to auscultation. Heart:     Regular rate and rhythm; no murmurs, rubs,  or bruits. Abdomen:     Soft, nontender and nondistended. No masses, hepatosplenomegaly or hernias noted. Normal bowel sounds. Msk:     Symmetrical with no gross deformities. Normal posture. Pulses:     Normal pulses noted. Extremities:     No clubbing, cyanosis, edema or deformities noted. Neurologic:     Alert and  oriented x4;  grossly normal neurologically. Skin:     Intact without significant lesions or rashes. Cervical Nodes:     No significant cervical adenopathy. Psych:     Alert and cooperative. Normal mood and affect.   Impression & Recommendations:  Problem # 1:  TRANSAMINASES, SERUM, ELEVATED (ICD-790.4) Rule out fatty liver, viral and metabolic liver diseases. She is advised to remain off Alli but this is not likely the cause of her elevated transaminases. Her mildly elevated LFTs are not a contraindication to general anesthesia if needed for surgery. Orders: T-Alpha-1-Antitrypsin Tot [phone]) T-AMA [phone]) T-ANA [phone]) T-Anti SMA ([id number]) T-Ceruloplasmin [phone]) T-Hepatitis B Surface Antibody ([id number]) T-Hepatitis B Surface Antigen [phone]) T-Angiotensin i-Converting Enzyme ([id number]) Ultrasound Abdomen (UAS) TLB-Hepatic/Liver Function Pnl ([id number]) TLB-PT (Protime) (85610-PTP)   Problem # 2:  SCREENING COLORECTAL-CANCER (ICD-V76.51) Screening colonoscopy recommended in October 2017.  Patient Instructions: 1)  Get your labs drawn today in the basement.  2)  You have been scheduled for a Ultrasound. 3)  Please schedule a  follow-up appointment in 2- 3 weeks. 4)  Copy Sent To: Darryll Capers, MD 5)                         Hayden Rasmussen, MD

## 2010-11-26 NOTE — Assessment & Plan Note (Signed)
Summary: rov/mm/PT RESCD FROM BUMP/CCM   Vital Signs:  Patient Profile:   55 Years Old Female Height:     67 inches Weight:      175 pounds Temp:     98.2 degrees F oral Pulse rate:   76 / minute Resp:     14 per minute BP sitting:   110 / 80  (left arm)  Vitals Entered By: Willy Eddy, LPN (December 16, 2007 3:30 PM)                 Chief Complaint:  f/u after stopping wellbutrin/no side effects except being tired.  History of Present Illness: Review of varicosity mood stable fatique libedo OK not tearfull but feels fatigued  even when awakens from sleep....    Current Allergies: No known allergies   Past Medical History:    Reviewed history from 07/07/2007 and no changes required:       Depression       varicose veins with inflamation       Osteoarthritis  Past Surgical History:    Reviewed history from 06/02/2007 and no changes required:       Colonoscopy-08/11/2006       Cholecystectomy       Rt knee arthroscopy       Bilat Bunion sx   Family History:    Reviewed history from 06/02/2007 and no changes required:       Family History Diabetes 1st degree relative       Family History Hypertension  Social History:    Reviewed history from 06/02/2007 and no changes required:       Married       Never Smoked       Alcohol use-yes       Drug use-no    Review of Systems  The patient denies anorexia, fever, weight loss, vision loss, decreased hearing, hoarseness, chest pain, syncope, dyspnea on exhertion, peripheral edema, prolonged cough, hemoptysis, abdominal pain, melena, hematochezia, severe indigestion/heartburn, hematuria, incontinence, genital sores, muscle weakness, suspicious skin lesions, transient blindness, difficulty walking, depression, unusual weight change, abnormal bleeding, enlarged lymph nodes, angioedema, and breast masses.     Physical Exam  General:     Well-developed,well-nourished,in no acute distress; alert,appropriate  and cooperative throughout examination Head:     Normocephalic and atraumatic without obvious abnormalities. No apparent alopecia or balding. Eyes:     No corneal or conjunctival inflammation noted. EOMI. Perrla. Funduscopic exam benign, without hemorrhages, exudates or papilledema. Vision grossly normal. Ears:     External ear exam shows no significant lesions or deformities.  Otoscopic examination reveals clear canals, tympanic membranes are intact bilaterally without bulging, retraction, inflammation or discharge. Hearing is grossly normal bilaterally. Nose:     External nasal examination shows no deformity or inflammation. Nasal mucosa are pink and moist without lesions or exudates. Mouth:     Oral mucosa and oropharynx without lesions or exudates.  Teeth in good repair. Neck:     No deformities, masses, or tenderness noted. Lungs:     Normal respiratory effort, chest expands symmetrically. Lungs are clear to auscultation, no crackles or wheezes. Heart:     Normal rate and regular rhythm. S1 and S2 normal without gallop, murmur, click, rub or other extra sounds. Abdomen:     Bowel sounds positive,abdomen soft and non-tender without masses, organomegaly or hernias noted. Msk:     No deformity or scoliosis noted of thoracic or lumbar spine.  Impression & Recommendations:  Problem # 1:  OTHER MALAISE AND FATIGUE (ICD-780.79)  Orders: TLB-CBC Platelet - w/Differential (85025-CBCD) TLB-TSH (Thyroid Stimulating Hormone) (84443-TSH) TLB-B12 + Folate Pnl (84696_29528-U13/KGM)   Problem # 2:  DEPRESSION (ICD-311) off the welbutrin could this be the welbutrin The following medications were removed from the medication list:    Wellbutrin Xl 300 Mg Tb24 (Bupropion hcl) ..... Once daily Discussed treatment options, including trial of antidpressant medication. Will refer to behavioral health. Follow-up call in in 24-48 hours and recheck in 2 weeks, sooner as needed. Patient agrees to  call if any worsening of symptoms or thoughts of doing harm arise. Verified that the patient has no suicidal ideation at this time.   Problem # 3:  VARICOSE VEIN, LWR EXTREMITIES W/INFLAMMATION (ICD-454.1)  Problem # 4:  OSTEOARTHRITIS (ICD-715.90)  Her updated medication list for this problem includes:    Mobic 15 Mg Tabs (Meloxicam) ..... One by mouth daily Discussed use of medications, application of heat or cold, and exercises.   Complete Medication List: 1)  Glucosamine-chondroitin 500-400 Mg Tabs (Glucosamine-chondroitin) .... Once daily 2)  Bl Vitamin E 400 Unit Caps (Vitamin e) .... Once daily 3)  Calcium 500/d 500-400 Mg-unit Chew (Calcium-vitamin d) .... Once daily 4)  Daily Combo Multivits/calcium Tabs (Multiple vitamins-calcium) .... Once daily 5)  Vitamin D 1000 Unit Caps (Cholecalciferol) .... 2 once daily 6)  Mobic 15 Mg Tabs (Meloxicam) .... One by mouth daily     Prescriptions: MOBIC 15 MG  TABS (MELOXICAM) one by mouth daily  #90 x 3   Entered and Authorized by:   Stacie Glaze MD   Signed by:   Stacie Glaze MD on 12/16/2007   Method used:   Print then Give to Patient   RxID:   [id number]  ]  Appended Document: rov/mm/PT RESCD FROM BUMP/CCM        Current Allergies: No known allergies         Complete Medication List: 1)  Glucosamine-chondroitin 500-400 Mg Tabs (Glucosamine-chondroitin) .... Once daily 2)  Bl Vitamin E 400 Unit Caps (Vitamin e) .... Once daily 3)  Calcium 500/d 500-400 Mg-unit Chew (Calcium-vitamin d) .... Once daily 4)  Daily Combo Multivits/calcium Tabs (Multiple vitamins-calcium) .... Once daily 5)  Vitamin D 1000 Unit Caps (Cholecalciferol) .... 2 once daily 6)  Mobic 15 Mg Tabs (Meloxicam) .... One by mouth daily   Patient Instructions: 1)  Please schedule a follow-up appointment in 4 months.  CPX 2)  BMP prior to visit, ICD-9: 3)  Hepatic Panel prior to visit, ICD-9:  v70.0 4)  Lipid Panel prior to visit,  ICD-9: 5)  TSH prior to visit, ICD-9: 6)  CBC w/ Diff prior to visit, ICD-9: 7)  Urine-dip prior to visit, ICD-9:    ]

## 2010-11-28 NOTE — Assessment & Plan Note (Signed)
Summary: cpx/njr   Vital Signs:  Patient profile:   55 year old female Height:      66.5 inches Weight:      178 pounds BMI:     28.40 Temp:     98.2 degrees F oral Pulse rate:   72 / minute Resp:     14 per minute BP sitting:   116 / 76  (left arm)  Vitals Entered By: Willy Eddy, LPN (October 09, 2010 11:59 AM) CC: cpx Is Patient Diabetic? No   Primary Care Provider:  Darryll Capers, MD  CC:  cpx.  History of Present Illness: monitering of liver functions, weigthand monitering moderations of potential liver toxins given fatty liver diagnosis The pt was asked about all immunizations, health maint. services that are appropriate to their age and was given guidance on diet exercize  and weight management   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) 3)  Allergic Rhinitis Due To Other Allergen  (ICD-477.8) 4)  Viral Infection  (ICD-079.99) 5)  Acute Bronchitis  (ICD-466.0) 6)  Fatty Liver Disease  (ICD-571.8) 7)  Screening Colorectal-cancer  (ICD-V76.51) 8)  Transaminases, Serum, Elevated  (ICD-790.4) 9)  Osteoarthritis  (ICD-715.90) 10)  Other Malaise and Fatigue  (ICD-780.79) 11)  Preventive Health Care  (ICD-V70.0) 12)  Osteopenia  (ICD-733.90) 13)  Varicose Vein, 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) 14)  Depression  (ICD-311) 15)  Family History Diabetes 1st Degree Relative  (ICD-V18.0)  Current Problems (verified): 1)  Acne Rosacea  (ICD-695.3) 2)  Mixed Incontinence Urge and Stress  (ICD-788.33) 3)  Allergic Rhinitis Due To Other Allergen  (ICD-477.8) 4)  Viral Infection  (ICD-079.99) 5)  Acute Bronchitis  (ICD-466.0) 6)  Fatty Liver Disease  (ICD-571.8) 7)  Screening Colorectal-cancer  (ICD-V76.51) 8)  Transaminases, Serum, Elevated  (ICD-790.4) 9)   Osteoarthritis  (ICD-715.90) 10)  Other Malaise and Fatigue  (ICD-780.79) 11)  Preventive Health Care  (ICD-V70.0) 12)  Osteopenia  (ICD-733.90) 13)  Varicose Vein, 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 Take 1 Tablet By Mouth Once A Day 2)  Calcium 500/d 500-400 Mg-Unit  Chew (Calcium-Vitamin D) .... Take 1 Tablet By Mouth Once A Day 3)  Vitamin D 1000 Unit  Caps (Cholecalciferol) .... Take 2 Tablets By Mouth Once Daily 4)  Premarin 0.45 Mg Tabs (Estrogens Conjugated) .... Take 1 Tablet By Mouth Once A Day 5)  Prometrium 200 Mg Caps (Progesterone Micronized) .Marland Kitchen.. 1 For 12 Days Every 3 Months 6)  Glucosamine 1500 Complex  Caps (Glucosamine-Chondroit-Vit C-Mn) .... One By Mouth Daily 7)  Fish Oil 1200 Mg Caps (Omega-3 Fatty Acids) .... Once Daily 8)  Nasonex 50 Mcg/act Susp (Mometasone Furoate) .... Two Spray Q Nare Daily 9)  Vesicare 5 Mg Tabs (Solifenacin Succinate) .... One By Mouth Daily 10)  Doxycycline Hyclate 50 Mg Caps (Doxycycline Hyclate) .Marland Kitchen.. 1 Once Daily  Current Medications (verified): 1)  Bl Vitamin E 400 Unit  Caps (Vitamin E) .... Take 1 Tablet By Mouth Once A Day 2)  Calcium 500/d 500-400 Mg-Unit  Chew (Calcium-Vitamin D) .... Take 1 Tablet By Mouth Once A Day 3)  Vitamin D 1000 Unit  Caps (Cholecalciferol) .... Take 2 Tablets By Mouth Once Daily 4)  Premarin 0.3 Mg Tabs (Estrogens Conjugated) .... Once Daily 5)  Prometrium 200 Mg Caps (Progesterone Micronized) .Marland Kitchen.. 1 For 12 Days Every 3 Months 6)  Glucosamine 1500 Complex  Caps (Glucosamine-Chondroit-Vit C-Mn) .... One By Mouth Daily 7)  Fish Oil 1200 Mg Caps (Omega-3 Fatty Acids) .... Once Daily 8)  Nasonex 50 Mcg/act Susp (Mometasone Furoate) .... Two Spray Q Nare Daily 9)  Vesicare 5 Mg Tabs (Solifenacin Succinate) .... One By Mouth Daily 10)   Doxycycline Hyclate 50 Mg Caps (Doxycycline Hyclate) .Marland Kitchen.. 1 Once Daily  Allergies (verified): 1)  ! Niacin  Past History:  Family History: Last updated: 11/16/2008 Family History Diabetes 1st degree relative: Mother, Sister Family History Hypertension past hx of hemochromatosis Family History of Liver Cancer: Uncle x 2 No FH of Colon Cancer: Family History of Liver Disease/Cirrhosis: Mother(cirrhosis), Sister (Fatty Liver), Uncle (hemochromatosis)  Social History: Last updated: 01/15/2009 Married Never Smoked Alcohol use-stopped recently Drug use-no Occupation: RN  Risk Factors: Smoking Status: never (10/09/2010) Passive Smoke Exposure: no (10/09/2010)  Past medical, surgical, family and social histories (including risk factors) reviewed, and no changes noted (except as noted below).  Past Medical History: Reviewed history from 01/15/2009 and no changes required. Depression varicose veins with inflamation Osteoarthritis torn cartlige in rt knee Diverticulosis Steatohepatitis  Past Surgical History: Reviewed history from 11/16/2008 and no changes required. Colonoscopy-08/11/2006 Cholecystectomy without IOC, 02/2001 Rt knee arthroscopy, 09/2008 Bilateral Bunionectomy debridement and removal and lateral release of torn cartlidge in rt knee Arthroscopic left knee surgery Gum grafting, 08/2008  Family History: Reviewed history from 11/16/2008 and no changes required. Family History Diabetes 1st degree relative: Mother, Sister Family History Hypertension past hx of hemochromatosis Family History of Liver Cancer: Uncle x 2 No FH of Colon Cancer: Family History of Liver Disease/Cirrhosis: Mother(cirrhosis), Sister (Fatty Liver), Uncle (hemochromatosis)  Social History: Reviewed history from 01/15/2009 and no changes required. Married Never Smoked Alcohol use-stopped recently Drug use-no Occupation: Charity fundraiser  Review of Systems  The patient denies anorexia, fever,  weight loss, weight gain, vision loss, decreased hearing, hoarseness, chest pain, syncope, dyspnea on exertion, peripheral edema, prolonged cough, headaches, hemoptysis, abdominal pain, melena, severe indigestion/heartburn, hematuria, incontinence, genital sores, muscle weakness, suspicious skin lesions, transient blindness, difficulty walking, depression, unusual weight change, abnormal bleeding, enlarged lymph nodes, angioedema, and breast masses.    Physical Exam  General:  Well-developed,well-nourished,in no acute distress; alert,appropriate and cooperative throughout examination Head:  normocephalic and atraumatic.   Eyes:  pupils equal and pupils round.   Ears:  R ear normal and L ear normal.   Nose:  External nasal examination shows no deformity or inflammation. Nasal mucosa are pink and moist without lesions or exudates. Mouth:  Oral mucosa and oropharynx without lesions or exudates.  Teeth in good repair. Neck:  No deformities, masses, or tenderness noted. Lungs:  normal respiratory effort and no wheezes.   Heart:  normal rate, regular rhythm, and no murmur.   Abdomen:  Bowel sounds positive,abdomen soft and non-tender without masses, organomegaly or hernias noted. Msk:  No deformity or scoliosis noted of thoracic or lumbar spine.   Pulses:  R and L carotid,radial,femoral,dorsalis pedis and posterior tibial pulses are full and equal bilaterally Extremities:  No clubbing, cyanosis, edema, or deformity noted with normal full range of motion of all joints.   Neurologic:  No cranial nerve deficits noted. Station and gait are normal. Plantar reflexes  are down-going bilaterally. DTRs are symmetrical throughout. Sensory, motor and coordinative functions appear intact. Skin:  no rashes noted Cervical Nodes:  No lymphadenopathy noted Axillary Nodes:  No palpable lymphadenopathy Psych:  Cognition and judgment appear intact. Alert and cooperative with normal attention span and concentration. No  apparent delusions, illusions, hallucinations   Impression & Recommendations:  Problem # 1:  FATTY LIVER DISEASE (ICD-571.8) monitering liver fucntion which are slightly increased but  not out of prior range indicationg stable jild inflamation ongoing use of wine discusssed and weight gola not met I have spent greater that 30 min face to face evaluating this patient  Problem # 2:  PREVENTIVE HEALTH CARE (ICD-V70.0) Assessment: Unchanged The pt was asked about all immunizations, health maint. services that are appropriate to their age and was given guidance on diet exercize  and weight management  Mammogram: normal (12/24/2009) Pap smear: normal (12/24/2009) Colonoscopy: Results: Diverticulosis.       Location:  [facility].   (08/11/2006) Td Booster: Historical (10/27/2005)   Chol: 164 (10/01/2010)   HDL: 83.60 (10/01/2010)   LDL: 76 (10/01/2010)   TG: 20.0 (10/01/2010) TSH: 2.41 (10/01/2010)   Next mammogram due:: 12/2010 (10/09/2010) Next Colonoscopy due:: 08/2016 (09/20/2008)  Discussed using sunscreen, use of alcohol, drug use, self breast exam, routine dental care, routine eye care, schedule for GYN exam, routine physical exam, seat belts, multiple vitamins, osteoporosis prevention, adequate calcium intake in diet, recommendations for immunizations, mammograms and Pap smears.  Discussed exercise and checking cholesterol.  Discussed gun safety, safe sex, and contraception.  Complete Medication List: 1)  Bl Vitamin E 400 Unit Caps (Vitamin e) .... Take 1 tablet by mouth once a day 2)  Calcium 500/d 500-400 Mg-unit Chew (Calcium-vitamin d) .... Take 1 tablet by mouth once a day 3)  Vitamin D 1000 Unit Caps (Cholecalciferol) .... Take 2 tablets by mouth once daily 4)  Premarin 0.3 Mg Tabs (Estrogens conjugated) .... Once daily 5)  Prometrium 200 Mg Caps (Progesterone micronized) .Marland Kitchen.. 1 for 12 days every 3 months 6)  Glucosamine 1500 Complex Caps (Glucosamine-chondroit-vit  c-mn) .... One by mouth daily 7)  Fish Oil 1200 Mg Caps (Omega-3 fatty acids) .... Once daily 8)  Nasonex 50 Mcg/act Susp (Mometasone furoate) .... Two spray q nare daily 9)  Vesicare 5 Mg Tabs (Solifenacin succinate) .... One by mouth daily 10)  Doxycycline Hyclate 50 Mg Caps (Doxycycline hyclate) .Marland Kitchen.. 1 once daily  Patient Instructions: 1)  Please schedule a follow-up appointment in 4 months. 2)  Hepatic Panel prior to visit, ICD-9: Prescriptions: DOXYCYCLINE HYCLATE 50 MG CAPS (DOXYCYCLINE HYCLATE) 1 once daily  #30 x 6   Entered by:   Willy Eddy, LPN   Authorized by:   Stacie Glaze MD   Signed by:   Willy Eddy, LPN on 16/07/9603   Method used:   Electronically to        [facility]. [phone]* (retail)       [address]       [place], [place]  [address]       Ph: [phone]       Fax: [phone]   RxID:   [id number] VESICARE 5 MG TABS (SOLIFENACIN SUCCINATE) one by mouth daily  #30 x 6   Entered by:   Willy Eddy, LPN   Authorized by:   Stacie Glaze MD   Signed by:   Willy Eddy,  LPN on 30/86/5784   Method used:   Electronically to        [facility]. [phone]* (retail)       [address]       [place], [place]  [address]       Ph: [phone]       Fax: [phone]   RxID:   [id number] NASONEX 50 MCG/ACT SUSP (MOMETASONE FUROATE) two spray q nare daily  #1 x 11   Entered by:   Willy Eddy, LPN   Authorized by:   Stacie Glaze MD   Signed by:   Willy Eddy, LPN on 51/88/4166   Method used:   Electronically to        [facility]. [phone]* (retail)       [address]       [place], [place]  [address]       Ph: [phone]       Fax: [phone]   RxID:   [id number]    Orders Added: 1)  Est. Patient 40-64 years [99396] 2)  Est. Patient Level III [06269]     Preventive Care Screening  Mammogram:    Date:   12/24/2009    Next Due:  12/2010    Results:  normal   Pap Smear:    Date:  12/24/2009    Next Due:  12/2010    Results:  normal

## 2010-12-25 ENCOUNTER — Ambulatory Visit
Admission: RE | Admit: 2010-12-25 | Discharge: 2010-12-25 | Disposition: A | Discharge status: Home / Self Care | Payer: BC Managed Care – PPO | Source: Ambulatory Visit | Attending: Obstetrics & Gynecology | Admitting: Obstetrics & Gynecology

## 2010-12-25 DIAGNOSIS — Z1239 Encounter for other screening for malignant neoplasm of breast: Secondary | ICD-10-CM

## 2011-01-28 ENCOUNTER — Encounter: Payer: Self-pay | Admitting: Internal Medicine

## 2011-01-30 ENCOUNTER — Other Ambulatory Visit (INDEPENDENT_AMBULATORY_CARE_PROVIDER_SITE_OTHER): Payer: BC Managed Care – PPO

## 2011-01-30 DIAGNOSIS — T887XXA Unspecified adverse effect of drug or medicament, initial encounter: Secondary | ICD-10-CM

## 2011-01-30 LAB — HEPATIC FUNCTION PANEL
ALT: 53 U/L — ABNORMAL HIGH (ref 0–35)
AST: 40 U/L — ABNORMAL HIGH (ref 0–37)
Albumin: 4.3 g/dL (ref 3.5–5.2)
Alkaline Phosphatase: 48 U/L (ref 39–117)
Bilirubin, Direct: 0.2 mg/dL (ref 0.0–0.3)
Total Bilirubin: 1 mg/dL (ref 0.3–1.2)
Total Protein: 6.9 g/dL (ref 6.0–8.3)

## 2011-02-07 ENCOUNTER — Ambulatory Visit: Payer: Self-pay | Admitting: Internal Medicine

## 2011-02-11 LAB — CBC
HCT: 42 % (ref 36.0–46.0)
Hemoglobin: 14.7 g/dL (ref 12.0–15.0)
MCHC: 34.9 g/dL (ref 30.0–36.0)
MCV: 91.6 fL (ref 78.0–100.0)
Platelets: 116 10*3/uL — ABNORMAL LOW (ref 150–400)
RBC: 4.58 MIL/uL (ref 3.87–5.11)
RDW: 11.7 % (ref 11.5–15.5)
WBC: 3.4 10*3/uL — ABNORMAL LOW (ref 4.0–10.5)

## 2011-02-11 LAB — PROTIME-INR
INR: 1 (ref 0.00–1.49)
Prothrombin Time: 13.2 seconds (ref 11.6–15.2)

## 2011-02-25 ENCOUNTER — Ambulatory Visit (INDEPENDENT_AMBULATORY_CARE_PROVIDER_SITE_OTHER): Payer: BC Managed Care – PPO | Admitting: Internal Medicine

## 2011-02-25 ENCOUNTER — Encounter: Payer: Self-pay | Admitting: Internal Medicine

## 2011-02-25 VITALS — BP 122/80 | HR 72 | Temp 98.2°F | Resp 14 | Ht 66.0 in | Wt 166.0 lb

## 2011-02-25 DIAGNOSIS — K760 Fatty (change of) liver, not elsewhere classified: Secondary | ICD-10-CM

## 2011-02-25 DIAGNOSIS — K7689 Other specified diseases of liver: Secondary | ICD-10-CM

## 2011-02-25 NOTE — Progress Notes (Signed)
Subjective:    Patient ID: Terry Bell, female    DOB: 01/17/1956, 55 y.o.   MRN: [id number]  HPI The patient presents for long-standing liver function abnormalities thought to be a fatty liver. There is a increasing family history of hemachromatosis and she has an uncle on her mother side with liver cancer and recently diagnosed hemochromatosis as his etiology. This is the third sibling on that side with liver cancer. Mother had idiopathic cirrhosis she had a biopsy in the past which was sent to [facility] for review and there was an incomplete review. The patient has been abstinent of alcohol now for a month she has been off her hormone therapy and has lost 18 pounds    Review of Systems Appears well has no system complaints review of systems is essentially negative    Past Medical History  Diagnosis Date  . Depression   . Varicose veins   . Arthritis   . Diverticulosis   . Steatohepatitis    Past Surgical History  Procedure Date  . Torn cartledge in rt knee   . Colonsocopy   . Cholecystectomy   . Rt knee arthroscopy   . Bilateral bunionectomy   . Debridement and removal and lateral release of torn cartlidge in rt knee   . Arthroscopic left knee sugery   . Gum grating     reports that she has never smoked. She does not have any smokeless tobacco history on file. She reports that she does not drink alcohol or use illicit drugs. family history includes Cancer in an unspecified family member; Diabetes in her mother and sister; Hemochromatosis in an unspecified family member; Hypertension in an unspecified family member; and Liver disease in her mother and sister. Allergies  Allergen Reactions  . Niacin     REACTION: rash    Objective:   Physical Exam  Constitutional: She is oriented to person, place, and time. She appears well-developed and well-nourished. No distress.  HENT:  Head: Normocephalic and atraumatic.  Right Ear: External ear normal.  Left Ear: External  ear normal.  Nose: Nose normal.  Mouth/Throat: Oropharynx is clear and moist.  Eyes: Conjunctivae and EOM are normal. Pupils are equal, round, and reactive to light.  Neck: Normal range of motion. Neck supple. No JVD present. No tracheal deviation present. No thyromegaly present.  Cardiovascular: Normal rate, regular rhythm, normal heart sounds and intact distal pulses.   No murmur heard. Pulmonary/Chest: Effort normal and breath sounds normal. She has no wheezes. She exhibits no tenderness.  Abdominal: Soft. Bowel sounds are normal.  Musculoskeletal: Normal range of motion. She exhibits no edema and no tenderness.  Lymphadenopathy:    She has no cervical adenopathy.  Neurological: She is alert and oriented to person, place, and time. She has normal reflexes. No cranial nerve deficit.  Skin: Skin is warm and dry. She is not diaphoretic.  Psychiatric: She has a normal mood and affect. Her behavior is normal.      Blood pressure 122/80, pulse 72, temperature 98.2 F (36.8 C), temperature source Oral, resp. rate 14, height 5' 6"  (1.676 m), weight 166 lb (75.297 kg).  Excellent blood pressure   Assessment & Plan:  Our working diagnosis has been fatty liver disease she has lost 18 pounds she is virtually eliminated alcohol from her diet she still has a slight elevation of her liver functions although they are markedly improved. Due to this pervasive family history of liver cancer we are going to repeat some  essential studies including the genetics for hemochromatosis.

## 2011-03-01 LAB — HEMOCHROMATOSIS DNA-PCR(C282Y,H63D)

## 2011-03-03 ENCOUNTER — Ambulatory Visit
Admission: RE | Admit: 2011-03-03 | Discharge: 2011-03-03 | Disposition: A | Discharge status: Home / Self Care | Payer: BC Managed Care – PPO | Source: Ambulatory Visit | Attending: Internal Medicine | Admitting: Internal Medicine

## 2011-03-03 DIAGNOSIS — K760 Fatty (change of) liver, not elsewhere classified: Secondary | ICD-10-CM

## 2011-03-12 ENCOUNTER — Telehealth: Payer: Self-pay | Admitting: *Deleted

## 2011-03-12 NOTE — Progress Notes (Signed)
Addended by: Ricard Dillon MD on: 03/12/2011 08:37 AM   Modules accepted: Orders

## 2011-03-12 NOTE — Telephone Encounter (Signed)
PT WAS NOTIFIED OF ULTRASOUND AND DR Lovell Sheehan SENDING REFERRAL TO [facility] TO HEPATOLOGIST

## 2011-03-14 NOTE — Op Note (Signed)
[place]. [facility]  Patient:    TZIPORAH, KNOKE                        MRN: [id number] Proc. Date: 03/02/01 Adm. Date:  [id number] Attending:  Excell Seltzer Tappan                           Operative Report  PREOPERATIVE DIAGNOSIS:  Symptomatic cholelithiasis.  POSTOPERATIVE DIAGNOSIS:  Symptomatic cholelithiasis.  OPERATION PERFORMED:  Laparoscopic cholecystectomy.  SURGEON:  Darene Lamer. Hoxworth, M.D.  ASSISTANT:  Jaci Carrel, M.D.  ANESTHESIA:  General.  INDICATIONS FOR PROCEDURE:  The patient is a 55 year old white female with history of typical episodes of epigastric and right upper quadrant abdominal pain and an ultrasound showing multiple gallstones.  She was felt to have symptomatic cholelithiasis.  Laparoscopic cholecystectomy has been recommended and accepted.  The nature of the procedure, its indications and risks of bleeding, infection, bile leak and bile duct injury were discussed and understood.  The patient is now brought to the operating room for this procedure.  DESCRIPTION OF PROCEDURE:  The patient was brought to the operating room and placed in supine position on the operating table and general endotracheal anesthesia was induced.  The abdomen was sterilely prepped and draped.  Broad spectrum antibiotics were given preoperatively.  Local anesthesia was used to infiltrate the trocar sites prior to the incisions.  A 1 cm incision was made at the umbilicus.  Dissection was carried down to the underlying fascia.  It was sharply incised for 1 cm and the peritoneum entered under direct vision. Through a mattress suture of 0 Vicryl, the Hasson trocar was placed and pneumoperitoneum established.  Under direct vision 10 mm trocar was placed in the subxiphoid area and two 5 mm trocars along the right subcostal margin. The fundus of the gallbladder was visualized and grasped and elevated up from the liver and the infundibulum  retracted inferolaterally.  Fibrofatty tissue was stripped off the neck of the gallbladder and the peritoneum incised on the anterior and posterior aspect of the distal gallbladder.  The distal gallbladder and Calots triangle was thoroughly dissected.  The cystic duct gallbladder junction was clearly identified and dissected free and the cystic duct clipped to the gallbladder junction.  Operative cholangiogram was attempted.  The cystic duct was very tiny and would not admit the catheter and this was abandoned.  The cystic duct was then doubly clipped proximally and divided.  Anterior posterior branch of the cystic artery were identified coursing up on the gallbladder wall and were divided between clips. The gallbladder was then dissected free from its bed using hook and spatula cautery.  There was some moderate thickening of the gallbladder wall.  The gallbladder was removed intact through the umbilicus with multiple large stones.  The operative site was irrigated and complete hemostasis assured. Trocars removed under direct vision and all CO2 evacuated from the peritoneal cavity.  The pursestring suture was secured at the umbilicus.  Skin incisions were closed with interrupted 4-0 Monocryl and Steri-Strips.  Sponge, needle and instrument counts were correct.  Dry sterile dressings were applied.  The patient was transferred to the recovery room in good condition. DD:  03/02/01 TD:  03/02/01 Job: [id number]

## 2011-04-21 ENCOUNTER — Ambulatory Visit (INDEPENDENT_AMBULATORY_CARE_PROVIDER_SITE_OTHER): Payer: BC Managed Care – PPO | Admitting: Internal Medicine

## 2011-04-21 DIAGNOSIS — R748 Abnormal levels of other serum enzymes: Secondary | ICD-10-CM

## 2011-04-21 DIAGNOSIS — R7401 Elevation of levels of liver transaminase levels: Secondary | ICD-10-CM

## 2011-04-21 DIAGNOSIS — R7402 Elevation of levels of lactic acid dehydrogenase (LDH): Secondary | ICD-10-CM

## 2011-06-24 ENCOUNTER — Ambulatory Visit: Payer: BC Managed Care – PPO | Admitting: Internal Medicine

## 2011-07-04 ENCOUNTER — Encounter: Payer: Self-pay | Admitting: Internal Medicine

## 2011-07-04 NOTE — Progress Notes (Signed)
Subjective:    Patient ID: Terry Bell, female    DOB: 06/06/1956, 55 y.o.   MRN: [id number]  HPI Patient is a 55 year old white female presents for followup of her disease.  His history of fatty liver and has had intolerance of alcohol.  There is some family history of liver disease as well. She is followed for rosacea she correctly good osteopenia on calcium and vitamin D   Review of Systems  Constitutional: Negative for activity change, appetite change and fatigue.  HENT: Negative for ear pain, congestion, neck pain, postnasal drip and sinus pressure.   Eyes: Negative for redness and visual disturbance.  Respiratory: Negative for cough, shortness of breath and wheezing.   Gastrointestinal: Negative for abdominal pain and abdominal distention.  Genitourinary: Negative for dysuria, frequency and menstrual problem.  Musculoskeletal: Negative for myalgias, joint swelling and arthralgias.  Skin: Negative for rash and wound.  Neurological: Negative for dizziness, weakness and headaches.  Hematological: Negative for adenopathy. Does not bruise/bleed easily.  Psychiatric/Behavioral: Negative for sleep disturbance and decreased concentration.   Past Medical History  Diagnosis Date  . Depression   . Varicose veins   . Arthritis   . Diverticulosis   . Steatohepatitis    Past Surgical History  Procedure Date  . Torn cartledge in rt knee   . Colonsocopy   . Cholecystectomy   . Rt knee arthroscopy   . Bilateral bunionectomy   . Debridement and removal and lateral release of torn cartlidge in rt knee   . Arthroscopic left knee sugery   . Gum grating     reports that she has never smoked. She does not have any smokeless tobacco history on file. She reports that she does not drink alcohol or use illicit drugs. family history includes Cancer in an unspecified family member; Diabetes in her mother and sister; Hemochromatosis in an unspecified family member; Hypertension in an  unspecified family member; and Liver disease in her mother and sister. Allergies  Allergen Reactions  . Niacin     REACTION: rash       Objective:   Physical Exam  Constitutional: She is oriented to person, place, and time. She appears well-developed and well-nourished. No distress.  HENT:  Head: Normocephalic and atraumatic.  Right Ear: External ear normal.  Left Ear: External ear normal.  Nose: Nose normal.  Mouth/Throat: Oropharynx is clear and moist.  Eyes: Conjunctivae and EOM are normal. Pupils are equal, round, and reactive to light.  Neck: Normal range of motion. Neck supple. No JVD present. No tracheal deviation present. No thyromegaly present.  Cardiovascular: Normal rate, regular rhythm, normal heart sounds and intact distal pulses.   No murmur heard. Pulmonary/Chest: Effort normal and breath sounds normal. She has no wheezes. She exhibits no tenderness.  Abdominal: Soft. Bowel sounds are normal.  Musculoskeletal: Normal range of motion. She exhibits no edema and no tenderness.  Lymphadenopathy:    She has no cervical adenopathy.  Neurological: She is alert and oriented to person, place, and time. She has normal reflexes. No cranial nerve deficit.  Skin: Skin is warm and dry. She is not diaphoretic.  Psychiatric: She has a normal mood and affect. Her behavior is normal.          Assessment & Plan:   discussed continued monitoring  Functions with expiration of other etiologies of elevations of liver functions other than fatty liver and screen in the past for infectious causes  she's had ultrasounds. Functions we scheduled as well  as the neurological levels

## 2011-07-09 ENCOUNTER — Ambulatory Visit (INDEPENDENT_AMBULATORY_CARE_PROVIDER_SITE_OTHER): Payer: BC Managed Care – PPO | Admitting: Internal Medicine

## 2011-07-09 ENCOUNTER — Encounter: Payer: Self-pay | Admitting: Internal Medicine

## 2011-07-09 VITALS — BP 120/70 | HR 68 | Temp 98.2°F | Resp 14 | Ht 66.0 in | Wt 172.0 lb

## 2011-07-09 DIAGNOSIS — K760 Fatty (change of) liver, not elsewhere classified: Secondary | ICD-10-CM

## 2011-07-09 DIAGNOSIS — K7689 Other specified diseases of liver: Secondary | ICD-10-CM

## 2011-07-09 DIAGNOSIS — L719 Rosacea, unspecified: Secondary | ICD-10-CM

## 2011-07-09 MED ORDER — DOXYCYCLINE HYCLATE 100 MG PO TABS: 100.0000 mg | ORAL_TABLET | Freq: Every day | ORAL | Status: DC

## 2011-07-09 NOTE — Progress Notes (Signed)
  Subjective:    Patient ID: Terry Bell, female    DOB: 1956/03/03, 55 y.o.   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  She has lost weight she is now abstinent of all alcohol and monitor her liver functions show a continued mild to moderate elevation of the SGOT -- CPT.  She has no obstructive symptoms of her liver specifically no sequela I passive congestion of her liver but the cholesterol just wishes to perform an EGD to look at the lower esophagus to make sure that there is no subtle changes.  We discussed in detail the pathophysiology and prognosis of nonalcoholic cirrhosis  Review of Systems  Constitutional: Negative for activity change, appetite change and fatigue.  HENT: Negative for ear pain, congestion, neck pain, postnasal drip and sinus pressure.   Eyes: Negative for redness and visual disturbance.  Respiratory: Negative for cough, shortness of breath and wheezing.   Gastrointestinal: Negative for abdominal pain and abdominal distention.  Genitourinary: Negative for dysuria, frequency and menstrual problem.  Musculoskeletal: Negative for myalgias, joint swelling and arthralgias.  Skin: Negative for rash and wound.  Neurological: Negative for dizziness, weakness and headaches.  Hematological: Negative for adenopathy. Does not bruise/bleed easily.  Psychiatric/Behavioral: Negative for sleep disturbance and decreased concentration.       Objective:   Physical Exam  Vitals reviewed. Constitutional: She is oriented to person, place, and time. She appears well-developed and well-nourished. No distress.  HENT:  Head: Normocephalic and atraumatic.  Right Ear: External ear normal.  Left Ear: External ear normal.  Nose: Nose normal.  Mouth/Throat: Oropharynx is clear and moist.  Eyes: Conjunctivae and EOM are normal. Pupils are equal,  round, and reactive to light.  Neck: Normal range of motion. Neck supple. No JVD present. No tracheal deviation present. No thyromegaly present.  Cardiovascular: Normal rate, regular rhythm, normal heart sounds and intact distal pulses.   No murmur heard. Pulmonary/Chest: Effort normal and breath sounds normal. She has no wheezes. She exhibits no tenderness.  Abdominal: Soft. Bowel sounds are normal.  Musculoskeletal: Normal range of motion. She exhibits no edema and no tenderness.  Lymphadenopathy:    She has no cervical adenopathy.  Neurological: She is alert and oriented to person, place, and time. She has normal reflexes. No cranial nerve deficit.  Skin: Skin is warm and dry. She is not diaphoretic.  Psychiatric: She has a normal mood and affect. Her behavior is normal.          Assessment & Plan:  Progressive nonalcoholic cirrhosis of the liver.  I have spent more than 30 minutes examining this patient face-to-face of which over half was spent in counseling Reviewed the consultants notes discussed the need for the EGD reviewed continuing monitoring plan to continue an alcohol abstinence.

## 2011-07-11 ENCOUNTER — Other Ambulatory Visit: Payer: Self-pay | Admitting: Internal Medicine

## 2011-07-24 NOTE — Patient Instructions (Signed)
Patient was instructed to continue all medications as prescribed. To stop at the checkout desk and schedule a followup appointment  

## 2011-07-28 ENCOUNTER — Ambulatory Visit (INDEPENDENT_AMBULATORY_CARE_PROVIDER_SITE_OTHER)
Admission: RE | Admit: 2011-07-28 | Discharge: 2011-07-28 | Disposition: A | Discharge status: Home / Self Care | Payer: BC Managed Care – PPO | Source: Ambulatory Visit | Attending: Internal Medicine | Admitting: Internal Medicine

## 2011-07-28 ENCOUNTER — Telehealth: Payer: Self-pay | Admitting: *Deleted

## 2011-07-28 DIAGNOSIS — R079 Chest pain, unspecified: Secondary | ICD-10-CM

## 2011-07-28 DIAGNOSIS — R0781 Pleurodynia: Secondary | ICD-10-CM

## 2011-07-28 MED ORDER — CHLORZOXAZONE 250 MG PO TABS: 250.0000 mg | ORAL_TABLET | Freq: Three times a day (TID) | ORAL | Status: AC | PRN

## 2011-07-28 NOTE — Telephone Encounter (Signed)
May have parafon forte 500 tid prn # 30 and xray- ptinformed

## 2011-07-28 NOTE — Telephone Encounter (Signed)
Pt. Had trauma to left side of chest cleaning the garden tub.  She also injured her low back moving furniture and is taking Ibuprofen and icing both areas. Asking if she can have RX that might work better for both injuries.

## 2011-10-16 ENCOUNTER — Ambulatory Visit: Payer: BC Managed Care – PPO | Admitting: Internal Medicine

## 2011-10-23 ENCOUNTER — Encounter: Payer: Self-pay | Admitting: Internal Medicine

## 2011-10-23 ENCOUNTER — Ambulatory Visit (INDEPENDENT_AMBULATORY_CARE_PROVIDER_SITE_OTHER): Payer: BC Managed Care – PPO | Admitting: Internal Medicine

## 2011-10-23 DIAGNOSIS — K760 Fatty (change of) liver, not elsewhere classified: Secondary | ICD-10-CM

## 2011-10-23 DIAGNOSIS — K7689 Other specified diseases of liver: Secondary | ICD-10-CM

## 2011-10-23 DIAGNOSIS — Z0000 Encounter for general adult medical examination without abnormal findings: Secondary | ICD-10-CM

## 2011-10-23 NOTE — Patient Instructions (Signed)
The patient is instructed to continue all medications as prescribed. Schedule followup with check out clerk upon leaving the clinic  

## 2011-10-23 NOTE — Progress Notes (Signed)
Subjective:    Patient ID: Terry Bell, female    DOB: 06-24-1956, 55 y.o.   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 She is seeing a hepatologist at [facility]. 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 She presents for CPX  Review of Systems  Constitutional: Negative for activity change, appetite change and fatigue.  HENT: Negative for ear pain, congestion, neck pain, postnasal drip and sinus pressure.   Eyes: Negative for redness and visual disturbance.  Respiratory: Negative for cough, shortness of breath and wheezing.   Gastrointestinal: Negative for abdominal pain and abdominal distention.  Genitourinary: Negative for dysuria, frequency and menstrual problem.  Musculoskeletal: Negative for myalgias, joint swelling and arthralgias.  Skin: Negative for rash and wound.  Neurological: Negative for dizziness, weakness and headaches.  Hematological: Negative for adenopathy. Does not bruise/bleed easily.  Psychiatric/Behavioral: Negative for sleep disturbance and decreased concentration.   Past Medical History  Diagnosis Date  . Depression   . Varicose veins   . Arthritis   . Diverticulosis   . Steatohepatitis     History   Social History  . Marital Status: Married    Spouse Name: N/A    Number of Children: N/A  . Years of Education: N/A   Occupational History  . Not on file.   Social History Main Topics  . Smoking status: Never Smoker   . Smokeless tobacco: Not on file  . Alcohol Use: No  . Drug Use: No  . Sexually Active: Not on file   Other Topics Concern  . Not on file   Social History Narrative  . No narrative on file    Past Surgical History  Procedure Date  . Torn cartledge in rt knee   . Colonsocopy   . Cholecystectomy   . Rt knee arthroscopy   . Bilateral bunionectomy   . Debridement and removal and lateral release of torn cartlidge in rt knee   . Arthroscopic left knee sugery   . Gum grating     Family  History  Problem Relation Age of Onset  . Diabetes Mother   . Liver disease Mother   . Diabetes Sister   . Liver disease Sister   . Hypertension    . Cancer    . Hemochromatosis      Allergies  Allergen Reactions  . Niacin     REACTION: rash    Current Outpatient Prescriptions on File Prior to Visit  Medication Sig Dispense Refill  . calcium gluconate 500 MG tablet Take 500 mg by mouth daily.        . Cholecalciferol (VITAMIN D) 1000 UNITS capsule Take 1,000 Units by mouth daily.        Marland Kitchen doxycycline (VIBRAMYCIN) 50 MG capsule take 1 capsule by mouth once daily  30 capsule  6  . ibuprofen (ADVIL,MOTRIN) 200 MG tablet Take 200 mg by mouth every 6 (six) hours as needed.        . mometasone (NASONEX) 50 MCG/ACT nasal spray 2 sprays by Nasal route daily.        . Omega-3 Fatty Acids (FISH OIL) 1200 MG CAPS Take by mouth.        . vitamin E 400 UNIT capsule Take 400 Units by mouth daily.          BP 144/84  Pulse 72  Temp 98.2 F (36.8 C)  Resp 16  Ht 5' 6.5" (1.689 m)  Wt 176 lb (79.833 kg)  BMI 27.98 kg/m2       Objective:   Physical Exam  Nursing note and vitals reviewed. Constitutional: She is oriented to person, place, and time. She appears well-developed and well-nourished. No distress.  HENT:  Head: Normocephalic and atraumatic.  Right Ear: External ear normal.  Left Ear: External ear normal.  Nose: Nose normal.  Mouth/Throat: Oropharynx is clear and moist.  Eyes: Conjunctivae and EOM are normal. Pupils are equal, round, and reactive to light.  Neck: Normal range of motion. Neck supple. No JVD present. No tracheal deviation present. No thyromegaly present.  Cardiovascular: Normal rate, regular rhythm, normal heart sounds and intact distal pulses.   No murmur heard. Pulmonary/Chest: Effort normal and breath sounds normal. She has no wheezes. She exhibits no tenderness.  Abdominal: Soft. Bowel sounds are normal.  Musculoskeletal: Normal range of motion. She  exhibits no edema and no tenderness.  Lymphadenopathy:    She has no cervical adenopathy.  Neurological: She is alert and oriented to person, place, and time. She has normal reflexes. No cranial nerve deficit.  Skin: Skin is warm and dry. She is not diaphoretic.  Psychiatric: She has a normal mood and affect. Her behavior is normal.          Assessment & Plan:   This is a routine physical examination for this healthy  Female. Reviewed all health maintenance protocols including mammography colonoscopy bone density and reviewed appropriate screening labs. Her immunization history was reviewed as well as her current medications and allergies refills of her chronic medications were given and the plan for yearly health maintenance was discussed all orders and referrals were made as appropriate. Patient has genetic liver disease is present at her sister has a problem her brother has that this probably is normal prominence disease she is going to participate in his studies [employer] with fatty liver disease and come next week for brought to be drawn for genetic testing she is followed by a hepatologist in see her back in 6 months

## 2011-11-04 ENCOUNTER — Other Ambulatory Visit (INDEPENDENT_AMBULATORY_CARE_PROVIDER_SITE_OTHER): Payer: Self-pay

## 2011-11-04 DIAGNOSIS — Z3149 Encounter for other procreative investigation and testing: Secondary | ICD-10-CM

## 2011-11-04 DIAGNOSIS — Z1379 Encounter for other screening for genetic and chromosomal anomalies: Secondary | ICD-10-CM

## 2011-12-12 ENCOUNTER — Other Ambulatory Visit: Payer: Self-pay | Admitting: Obstetrics & Gynecology

## 2011-12-12 DIAGNOSIS — Z1231 Encounter for screening mammogram for malignant neoplasm of breast: Secondary | ICD-10-CM

## 2011-12-29 ENCOUNTER — Ambulatory Visit
Admission: RE | Admit: 2011-12-29 | Discharge: 2011-12-29 | Disposition: A | Discharge status: Home / Self Care | Payer: 59 | Source: Ambulatory Visit | Attending: Obstetrics & Gynecology | Admitting: Obstetrics & Gynecology

## 2011-12-29 DIAGNOSIS — Z1231 Encounter for screening mammogram for malignant neoplasm of breast: Secondary | ICD-10-CM

## 2012-01-20 ENCOUNTER — Ambulatory Visit (INDEPENDENT_AMBULATORY_CARE_PROVIDER_SITE_OTHER): Payer: 59 | Admitting: Internal Medicine

## 2012-01-20 ENCOUNTER — Encounter: Payer: Self-pay | Admitting: Internal Medicine

## 2012-01-20 VITALS — BP 102/70 | Temp 97.9°F | Wt 181.0 lb

## 2012-01-20 DIAGNOSIS — H9319 Tinnitus, unspecified ear: Secondary | ICD-10-CM

## 2012-01-20 DIAGNOSIS — H9311 Tinnitus, right ear: Secondary | ICD-10-CM

## 2012-01-20 MED ORDER — MOMETASONE FUROATE 50 MCG/ACT NA SUSP: 2.0000 | Freq: Every day | NASAL | Status: DC

## 2012-01-20 NOTE — Patient Instructions (Signed)
Use Nasonex daily ENT followup as discussed  Call or return to clinic prn if these symptoms worsen or fail to improve as anticipated.

## 2012-01-20 NOTE — Progress Notes (Signed)
  Subjective:    Patient ID: Terry Bell, female    DOB: 19-Jan-1956, 56 y.o.   MRN: [id number]  HPI  56 year old patient who presents with a one month history of significant right-sided tinnitus. She feels that she may also have slight diminished hearing on the right. Also during this period of time she's had frequent episodes of positional vertigo especially when bending and stooping. Denies any headache or focal neurological symptoms. She has had some rare self-limiting tinnitus over the years but nothing this severe or constant. She has a history of a septal deviation and allergic rhinitis.    Review of Systems  HENT: Positive for hearing loss and tinnitus (history of mild positional vertigo).        Objective:   Physical Exam  Constitutional: She is oriented to person, place, and time. She appears well-developed and well-nourished. No distress.  HENT:  Head: Normocephalic.  Right Ear: External ear normal.  Left Ear: External ear normal.       The left tympanic membrane and canal normal The right tympanic membrane was partially obscured by cerumen but appear normal Weber did not lateralize Air conduction greater than bone conduction Hearing appeared to be mildly impaired on the right  Eyes: Conjunctivae and EOM are normal. Pupils are equal, round, and reactive to light.  Neurological: She is alert and oriented to person, place, and time. She has normal reflexes. No cranial nerve deficit.       Finger to nose testing normal No dysmetria          Assessment & Plan:   Unilateral tinnitus and vertigo associated with some questionable hearing loss. We'll set her for ENT evaluation

## 2012-01-26 ENCOUNTER — Telehealth: Payer: Self-pay | Admitting: Internal Medicine

## 2012-01-26 NOTE — Telephone Encounter (Signed)
Patient called stating that she was referred to Dr. Dagmar Hait and would like to see a different Ear specialist and can not remember the name but will call back with the info.

## 2012-01-26 NOTE — Telephone Encounter (Signed)
Pt called back and said that the name of the Ear specialist was Dr Vicie Mutters. He is an independent physician and his phone  # is [phone].  Pts symptoms have not gone away and would like get referral done to see him asap.

## 2012-02-12 ENCOUNTER — Other Ambulatory Visit (HOSPITAL_COMMUNITY): Payer: Self-pay | Admitting: Otolaryngology

## 2012-02-12 DIAGNOSIS — G35 Multiple sclerosis: Secondary | ICD-10-CM

## 2012-02-12 DIAGNOSIS — D333 Benign neoplasm of cranial nerves: Secondary | ICD-10-CM

## 2012-02-15 ENCOUNTER — Other Ambulatory Visit: Payer: Self-pay | Admitting: Internal Medicine

## 2012-02-20 ENCOUNTER — Ambulatory Visit (HOSPITAL_COMMUNITY)
Admission: RE | Admit: 2012-02-20 | Discharge: 2012-02-20 | Disposition: A | Discharge status: Home / Self Care | Payer: 59 | Source: Ambulatory Visit | Attending: Otolaryngology | Admitting: Otolaryngology

## 2012-02-20 DIAGNOSIS — H9190 Unspecified hearing loss, unspecified ear: Secondary | ICD-10-CM | POA: Insufficient documentation

## 2012-02-20 DIAGNOSIS — D333 Benign neoplasm of cranial nerves: Secondary | ICD-10-CM

## 2012-02-20 DIAGNOSIS — R42 Dizziness and giddiness: Secondary | ICD-10-CM | POA: Insufficient documentation

## 2012-02-20 DIAGNOSIS — G35 Multiple sclerosis: Secondary | ICD-10-CM

## 2012-02-20 DIAGNOSIS — G35D Multiple sclerosis, unspecified: Secondary | ICD-10-CM

## 2012-02-20 DIAGNOSIS — H9319 Tinnitus, unspecified ear: Secondary | ICD-10-CM | POA: Insufficient documentation

## 2012-02-20 MED ORDER — GADOBENATE DIMEGLUMINE 529 MG/ML IV SOLN
15.0000 mL | Freq: Once | INTRAVENOUS | Status: AC
  Administered 2012-02-20: 15 mL via INTRAVENOUS

## 2012-04-22 ENCOUNTER — Ambulatory Visit (INDEPENDENT_AMBULATORY_CARE_PROVIDER_SITE_OTHER): Payer: 59 | Admitting: Internal Medicine

## 2012-04-22 ENCOUNTER — Encounter: Payer: Self-pay | Admitting: Internal Medicine

## 2012-04-22 VITALS — BP 140/80 | HR 72 | Temp 98.6°F | Resp 16 | Ht 66.5 in | Wt 174.0 lb

## 2012-04-22 DIAGNOSIS — K760 Fatty (change of) liver, not elsewhere classified: Secondary | ICD-10-CM

## 2012-04-22 DIAGNOSIS — K7689 Other specified diseases of liver: Secondary | ICD-10-CM

## 2012-04-22 NOTE — Patient Instructions (Signed)
The patient is instructed to continue all medications as prescribed. Schedule followup with check out clerk upon leaving the clinic  

## 2012-04-22 NOTE — Progress Notes (Signed)
Subjective:    Patient ID: Terry Bell, female    DOB: [birth year], 56 y.o.   MRN: [id number]  HPI patient is a 56 year old white female followed for her steatohepatitis.  She is also monitored for a history of rosacea osteopenia mixed incontinence and urge stress incontinence. She presents today for followup of laboratory values that were drawn proper hepatologist and a monitored of weight and blood pressure. Weight is down approximately 8 pounds    Review of Systems  Constitutional: Negative for activity change, appetite change and fatigue.  HENT: Negative for ear pain, congestion, neck pain, postnasal drip and sinus pressure.   Eyes: Negative for redness and visual disturbance.  Respiratory: Negative for cough, shortness of breath and wheezing.   Gastrointestinal: Negative for abdominal pain and abdominal distention.  Genitourinary: Negative for dysuria, frequency and menstrual problem.  Musculoskeletal: Negative for myalgias, joint swelling and arthralgias.  Skin: Negative for rash and wound.  Neurological: Negative for dizziness, weakness and headaches.  Hematological: Negative for adenopathy. Does not bruise/bleed easily.  Psychiatric/Behavioral: Negative for disturbed wake/sleep cycle and decreased concentration.   Past Medical History  Diagnosis Date  . Depression   . Varicose veins   . Arthritis   . Diverticulosis   . Steatohepatitis     History   Social History  . Marital Status: Married    Spouse Name: N/A    Number of Children: N/A  . Years of Education: N/A   Occupational History  . Not on file.   Social History Main Topics  . Smoking status: Never Smoker   . Smokeless tobacco: Not on file  . Alcohol Use: No  . Drug Use: No  . Sexually Active: Not on file   Other Topics Concern  . Not on file   Social History Narrative  . No narrative on file    Past Surgical History  Procedure Date  . Torn cartledge in rt knee   . Colonsocopy   .  Cholecystectomy   . Rt knee arthroscopy   . Bilateral bunionectomy   . Debridement and removal and lateral release of torn cartlidge in rt knee   . Arthroscopic left knee sugery   . Gum grating     Family History  Problem Relation Age of Onset  . Diabetes Mother   . Liver disease Mother   . Diabetes Sister   . Liver disease Sister   . Hypertension    . Cancer    . Hemochromatosis      Allergies  Allergen Reactions  . Niacin     REACTION: rash    Current Outpatient Prescriptions on File Prior to Visit  Medication Sig Dispense Refill  . calcium gluconate 500 MG tablet Take 500 mg by mouth daily.        . Cholecalciferol (VITAMIN D) 1000 UNITS capsule Take 1,000 Units by mouth daily.        Marland Kitchen doxycycline (VIBRAMYCIN) 50 MG capsule take 1 capsule by mouth once daily  30 capsule  6  . mometasone (NASONEX) 50 MCG/ACT nasal spray Place 2 sprays into the nose daily.  17 g  6  . Omega-3 Fatty Acids (FISH OIL) 1200 MG CAPS Take by mouth.       . vitamin E 400 UNIT capsule Take 800 Units by mouth.         BP 140/80  Pulse 72  Temp 98.6 F (37 C)  Resp 16  Ht 5' 6.5" (1.689 m)  Wt 174 lb (  78.926 kg)  BMI 27.66 kg/m2       Objective:   Physical Exam  Nursing note and vitals reviewed. Constitutional: She is oriented to person, place, and time. She appears well-developed and well-nourished. No distress.  HENT:  Head: Normocephalic and atraumatic.  Right Ear: External ear normal.  Left Ear: External ear normal.  Nose: Nose normal.  Mouth/Throat: Oropharynx is clear and moist.  Eyes: Conjunctivae and EOM are normal. Pupils are equal, round, and reactive to light.  Neck: Normal range of motion. Neck supple. No JVD present. No tracheal deviation present. No thyromegaly present.  Cardiovascular: Normal rate, regular rhythm, normal heart sounds and intact distal pulses.   No murmur heard. Pulmonary/Chest: Effort normal and breath sounds normal. She has no wheezes. She exhibits  no tenderness.  Abdominal: Soft. Bowel sounds are normal.  Musculoskeletal: Normal range of motion. She exhibits no edema and no tenderness.  Lymphadenopathy:    She has no cervical adenopathy.  Neurological: She is alert and oriented to person, place, and time. She has normal reflexes. No cranial nerve deficit.  Skin: Skin is warm and dry. She is not diaphoretic.  Psychiatric: She has a normal mood and affect. Her behavior is normal.          Assessment & Plan:  The patient has been seen at the [facility] for fatty liver desease and has been instructed to stop NSAIDs and use low dose tylenol or ultram. Monitoring upper EGD every two years and screening at [facility] research project at [employer] , reviewed labs  Leg cramping and liver disease.

## 2012-08-17 ENCOUNTER — Other Ambulatory Visit (INDEPENDENT_AMBULATORY_CARE_PROVIDER_SITE_OTHER): Payer: 59

## 2012-08-17 DIAGNOSIS — K7689 Other specified diseases of liver: Secondary | ICD-10-CM

## 2012-08-17 DIAGNOSIS — K760 Fatty (change of) liver, not elsewhere classified: Secondary | ICD-10-CM

## 2012-08-17 LAB — HEPATIC FUNCTION PANEL
ALT: 36 U/L — ABNORMAL HIGH (ref 0–35)
AST: 29 U/L (ref 0–37)
Albumin: 4.1 g/dL (ref 3.5–5.2)
Alkaline Phosphatase: 54 U/L (ref 39–117)
Bilirubin, Direct: 0.2 mg/dL (ref 0.0–0.3)
Total Bilirubin: 1.1 mg/dL (ref 0.3–1.2)
Total Protein: 7.3 g/dL (ref 6.0–8.3)

## 2012-08-23 ENCOUNTER — Encounter: Payer: Self-pay | Admitting: Internal Medicine

## 2012-08-23 ENCOUNTER — Ambulatory Visit (INDEPENDENT_AMBULATORY_CARE_PROVIDER_SITE_OTHER): Payer: 59 | Admitting: Internal Medicine

## 2012-08-23 VITALS — BP 124/80 | HR 72 | Temp 98.6°F | Resp 16 | Ht 66.0 in | Wt 168.0 lb

## 2012-08-23 DIAGNOSIS — R7402 Elevation of levels of lactic acid dehydrogenase (LDH): Secondary | ICD-10-CM

## 2012-08-23 DIAGNOSIS — R748 Abnormal levels of other serum enzymes: Secondary | ICD-10-CM

## 2012-08-23 DIAGNOSIS — R7401 Elevation of levels of liver transaminase levels: Secondary | ICD-10-CM

## 2012-08-23 MED ORDER — MOMETASONE FUROATE 50 MCG/ACT NA SUSP: 2.0000 | NASAL | Status: DC | PRN

## 2012-08-23 MED ORDER — ESTROGENS CONJUGATED 0.3 MG PO TABS: 0.3000 mg | ORAL_TABLET | Freq: Every day | ORAL | Status: DC

## 2012-08-23 MED ORDER — ACETAMINOPHEN 500 MG PO TABS: 500.0000 mg | ORAL_TABLET | Freq: Four times a day (QID) | ORAL | Status: DC | PRN

## 2012-08-23 NOTE — Patient Instructions (Addendum)
The patient is instructed to continue all medications as prescribed. Schedule followup with check out clerk upon leaving the clinic  

## 2012-08-23 NOTE — Progress Notes (Signed)
Subjective:    Patient ID: Terry Bell, female    DOB: Jan 10, 1956, 56 y.o.   MRN: [id number]  HPI  Follow up liver function Very good results!weight loss and gluten free diet  Review of Systems  Constitutional: Negative for activity change, appetite change and fatigue.  HENT: Negative for ear pain, congestion, neck pain, postnasal drip and sinus pressure.   Eyes: Negative for redness and visual disturbance.  Respiratory: Negative for cough, shortness of breath and wheezing.   Gastrointestinal: Negative for abdominal pain and abdominal distention.  Genitourinary: Negative for dysuria, frequency and menstrual problem.  Musculoskeletal: Negative for myalgias, joint swelling and arthralgias.  Skin: Negative for rash and wound.  Neurological: Negative for dizziness, weakness and headaches.  Hematological: Negative for adenopathy. Does not bruise/bleed easily.  Psychiatric/Behavioral: Negative for disturbed wake/sleep cycle and decreased concentration.  All other systems reviewed and are negative.   Past Medical History  Diagnosis Date  . Depression   . Varicose veins   . Arthritis   . Diverticulosis   . Steatohepatitis     History   Social History  . Marital Status: Married    Spouse Name: N/A    Number of Children: N/A  . Years of Education: N/A   Occupational History  . Not on file.   Social History Main Topics  . Smoking status: Never Smoker   . Smokeless tobacco: Not on file  . Alcohol Use: No  . Drug Use: No  . Sexually Active: Not on file   Other Topics Concern  . Not on file   Social History Narrative  . No narrative on file    Past Surgical History  Procedure Date  . Torn cartledge in rt knee   . Colonsocopy   . Cholecystectomy   . Rt knee arthroscopy   . Bilateral bunionectomy   . Debridement and removal and lateral release of torn cartlidge in rt knee   . Arthroscopic left knee sugery   . Gum grating     Family History  Problem Relation  Age of Onset  . Diabetes Mother   . Liver disease Mother   . Diabetes Sister   . Liver disease Sister   . Hypertension    . Cancer    . Hemochromatosis      Allergies  Allergen Reactions  . Niacin     REACTION: rash    Current Outpatient Prescriptions on File Prior to Visit  Medication Sig Dispense Refill  . acetaminophen (TYLENOL) 500 MG tablet Take 1,000 mg by mouth daily.      . calcium gluconate 500 MG tablet Take 500 mg by mouth daily.        . Cholecalciferol (VITAMIN D) 1000 UNITS capsule Take 1,000 Units by mouth daily.        Marland Kitchen doxycycline (VIBRAMYCIN) 50 MG capsule take 1 capsule by mouth once daily  30 capsule  6  . mometasone (NASONEX) 50 MCG/ACT nasal spray Place 2 sprays into the nose daily.  17 g  6  . Omega-3 Fatty Acids (FISH OIL) 1200 MG CAPS Take by mouth.       . vitamin E 400 UNIT capsule Take 800 Units by mouth.         BP 124/80  Pulse 72  Temp 98.6 F (37 C)  Resp 16  Ht 5\' 6"  (1.676 m)  Wt 168 lb (76.204 kg)  BMI 27.12 kg/m2        Objective:   Physical Exam  Nursing note and vitals reviewed. Constitutional: She is oriented to person, place, and time. She appears well-developed and well-nourished. No distress.  HENT:  Head: Normocephalic and atraumatic.  Right Ear: External ear normal.  Left Ear: External ear normal.  Nose: Nose normal.  Mouth/Throat: Oropharynx is clear and moist.  Eyes: Conjunctivae normal and EOM are normal. Pupils are equal, round, and reactive to light.  Neck: Normal range of motion. Neck supple. No JVD present. No tracheal deviation present. No thyromegaly present.  Cardiovascular: Normal rate, regular rhythm, normal heart sounds and intact distal pulses.   No murmur heard. Pulmonary/Chest: Effort normal and breath sounds normal. She has no wheezes. She exhibits no tenderness.  Abdominal: Soft. Bowel sounds are normal.  Musculoskeletal: Normal range of motion. She exhibits no edema and no tenderness.    Lymphadenopathy:    She has no cervical adenopathy.  Neurological: She is alert and oriented to person, place, and time. She has normal reflexes. No cranial nerve deficit.  Skin: Skin is warm and dry. She is not diaphoretic.  Psychiatric: She has a normal mood and affect. Her behavior is normal.          Assessment & Plan:  Normalized liver function Fatty liver has stabilized Ultrasound in 6 months and endoscopy in six months

## 2012-08-23 NOTE — Addendum Note (Signed)
Addended by: Stacie Glaze on: 08/23/2012 10:23 AM   Modules accepted: Orders

## 2012-12-11 ENCOUNTER — Other Ambulatory Visit: Payer: Self-pay

## 2012-12-15 ENCOUNTER — Other Ambulatory Visit: Payer: Self-pay | Admitting: Obstetrics & Gynecology

## 2012-12-15 ENCOUNTER — Other Ambulatory Visit: Payer: 59

## 2012-12-15 DIAGNOSIS — Z1231 Encounter for screening mammogram for malignant neoplasm of breast: Secondary | ICD-10-CM

## 2012-12-22 ENCOUNTER — Encounter: Payer: 59 | Admitting: Internal Medicine

## 2013-01-11 ENCOUNTER — Ambulatory Visit: Payer: 59

## 2013-02-09 ENCOUNTER — Ambulatory Visit
Admission: RE | Admit: 2013-02-09 | Discharge: 2013-02-09 | Disposition: A | Discharge status: Home / Self Care | Payer: 59 | Source: Ambulatory Visit | Attending: Obstetrics & Gynecology | Admitting: Obstetrics & Gynecology

## 2013-02-09 DIAGNOSIS — Z1231 Encounter for screening mammogram for malignant neoplasm of breast: Secondary | ICD-10-CM

## 2013-02-21 ENCOUNTER — Other Ambulatory Visit (INDEPENDENT_AMBULATORY_CARE_PROVIDER_SITE_OTHER): Payer: 59

## 2013-02-21 DIAGNOSIS — Z0000 Encounter for general adult medical examination without abnormal findings: Secondary | ICD-10-CM

## 2013-02-21 LAB — CBC WITH DIFFERENTIAL/PLATELET
Basophils Absolute: 0 10*3/uL (ref 0.0–0.1)
Basophils Relative: 0.5 % (ref 0.0–3.0)
Eosinophils Absolute: 0 10*3/uL (ref 0.0–0.7)
Eosinophils Relative: 1.1 % (ref 0.0–5.0)
HCT: 42.6 % (ref 36.0–46.0)
Hemoglobin: 15 g/dL (ref 12.0–15.0)
Lymphocytes Relative: 31.1 % (ref 12.0–46.0)
Lymphs Abs: 1.4 10*3/uL (ref 0.7–4.0)
MCHC: 35.2 g/dL (ref 30.0–36.0)
MCV: 88.4 fl (ref 78.0–100.0)
Monocytes Absolute: 0.3 10*3/uL (ref 0.1–1.0)
Monocytes Relative: 6.9 % (ref 3.0–12.0)
Neutro Abs: 2.7 10*3/uL (ref 1.4–7.7)
Neutrophils Relative %: 60.4 % (ref 43.0–77.0)
Platelets: 131 10*3/uL — ABNORMAL LOW (ref 150.0–400.0)
RBC: 4.82 Mil/uL (ref 3.87–5.11)
RDW: 12.6 % (ref 11.5–14.6)
WBC: 4.5 10*3/uL (ref 4.5–10.5)

## 2013-02-21 LAB — POCT URINALYSIS DIPSTICK
Bilirubin, UA: NEGATIVE
Blood, UA: NEGATIVE
Glucose, UA: NEGATIVE
Ketones, UA: NEGATIVE
Nitrite, UA: NEGATIVE
Protein, UA: NEGATIVE
Spec Grav, UA: 1.025
Urobilinogen, UA: 1
pH, UA: 6

## 2013-02-21 LAB — HEPATIC FUNCTION PANEL
ALT: 28 U/L (ref 0–35)
AST: 27 U/L (ref 0–37)
Albumin: 4.4 g/dL (ref 3.5–5.2)
Alkaline Phosphatase: 46 U/L (ref 39–117)
Bilirubin, Direct: 0.2 mg/dL (ref 0.0–0.3)
Total Bilirubin: 1.2 mg/dL (ref 0.3–1.2)
Total Protein: 7.1 g/dL (ref 6.0–8.3)

## 2013-02-21 LAB — BASIC METABOLIC PANEL
BUN: 23 mg/dL (ref 6–23)
CO2: 26 mEq/L (ref 19–32)
Calcium: 9.1 mg/dL (ref 8.4–10.5)
Chloride: 108 mEq/L (ref 96–112)
Creatinine, Ser: 0.7 mg/dL (ref 0.4–1.2)
GFR: 87.44 mL/min (ref >60.00)
Glucose, Bld: 99 mg/dL (ref 70–99)
Potassium: 3.9 mEq/L (ref 3.5–5.1)
Sodium: 139 mEq/L (ref 135–145)

## 2013-02-21 LAB — LIPID PANEL
Cholesterol: 121 mg/dL (ref 0–200)
HDL: 64.2 mg/dL (ref >39.00)
LDL Cholesterol: 53 mg/dL (ref 0–99)
Total CHOL/HDL Ratio: 2
Triglycerides: 21 mg/dL (ref 0.0–149.0)
VLDL: 4.2 mg/dL (ref 0.0–40.0)

## 2013-02-21 LAB — TSH: TSH: 2.03 u[IU]/mL (ref 0.35–5.50)

## 2013-02-25 ENCOUNTER — Encounter: Payer: 59 | Admitting: Internal Medicine

## 2013-02-28 ENCOUNTER — Encounter: Payer: Self-pay | Admitting: Internal Medicine

## 2013-02-28 ENCOUNTER — Ambulatory Visit (INDEPENDENT_AMBULATORY_CARE_PROVIDER_SITE_OTHER): Payer: 59 | Admitting: Internal Medicine

## 2013-02-28 VITALS — BP 136/80 | HR 72 | Temp 98.2°F | Resp 16 | Ht 72.0 in | Wt 170.0 lb

## 2013-02-28 DIAGNOSIS — Z0000 Encounter for general adult medical examination without abnormal findings: Secondary | ICD-10-CM

## 2013-02-28 DIAGNOSIS — K7689 Other specified diseases of liver: Secondary | ICD-10-CM

## 2013-02-28 DIAGNOSIS — K760 Fatty (change of) liver, not elsewhere classified: Secondary | ICD-10-CM

## 2013-02-28 NOTE — Progress Notes (Signed)
Subjective:    Patient ID: Terry Bell, female    DOB: [birth year], 57 y.o.   MRN: [id number]  HPI CPX Doing well   Review of Systems  Constitutional: Negative for activity change, appetite change and fatigue.  HENT: Negative for ear pain, congestion, neck pain, postnasal drip and sinus pressure.   Eyes: Negative for redness and visual disturbance.  Respiratory: Negative for cough, shortness of breath and wheezing.   Gastrointestinal: Negative for abdominal pain and abdominal distention.  Genitourinary: Negative for dysuria, frequency and menstrual problem.  Musculoskeletal: Negative for myalgias, joint swelling and arthralgias.  Skin: Negative for rash and wound.  Neurological: Negative for dizziness, weakness and headaches.  Hematological: Negative for adenopathy. Does not bruise/bleed easily.  Psychiatric/Behavioral: Negative for sleep disturbance and decreased concentration.   Past Medical History  Diagnosis Date  . Depression   . Varicose veins   . Arthritis   . Diverticulosis   . Steatohepatitis     History   Social History  . Marital Status: Married    Spouse Name: N/A    Number of Children: N/A  . Years of Education: N/A   Occupational History  . Not on file.   Social History Main Topics  . Smoking status: Never Smoker   . Smokeless tobacco: Not on file  . Alcohol Use: No  . Drug Use: No  . Sexually Active: Not on file   Other Topics Concern  . Not on file   Social History Narrative  . No narrative on file    Past Surgical History  Procedure Laterality Date  . Torn cartledge in rt knee    . Colonsocopy    . Cholecystectomy    . Rt knee arthroscopy    . Bilateral bunionectomy    . Debridement and removal and lateral release of torn cartlidge in rt knee    . Arthroscopic left knee sugery    . Gum grating      Family History  Problem Relation Age of Onset  . Diabetes Mother   . Liver disease Mother   . Diabetes Sister   . Liver disease  Sister   . Hypertension    . Cancer    . Hemochromatosis      Allergies  Allergen Reactions  . Niacin     REACTION: rash    Current Outpatient Prescriptions on File Prior to Visit  Medication Sig Dispense Refill  . acetaminophen (TYLENOL) 500 MG tablet Take 1 tablet (500 mg total) by mouth every 6 (six) hours as needed for pain.  30 tablet    . calcium gluconate 500 MG tablet Take 500 mg by mouth daily.        . Cholecalciferol (VITAMIN D) 1000 UNITS capsule Take 1,000 Units by mouth daily.        . mometasone (NASONEX) 50 MCG/ACT nasal spray Place 2 sprays into the nose as needed.  17 g  6  . Omega-3 Fatty Acids (FISH OIL) 1200 MG CAPS Take by mouth.       . vitamin E 400 UNIT capsule Take 800 Units by mouth.        No current facility-administered medications on file prior to visit.    BP 136/80  Pulse 72  Temp(Src) 98.2 F (36.8 C)  Resp 16  Ht 6' (1.829 m)  Wt 170 lb (77.111 kg)  BMI 23.05 kg/m2       Objective:   Physical Exam  Nursing note and vitals reviewed. Constitutional:  She is oriented to person, place, and time. She appears well-developed and well-nourished. No distress.  HENT:  Head: Normocephalic and atraumatic.  Right Ear: External ear normal.  Left Ear: External ear normal.  Nose: Nose normal.  Mouth/Throat: Oropharynx is clear and moist.  Eyes: Conjunctivae and EOM are normal. Pupils are equal, round, and reactive to light.  Neck: Normal range of motion. Neck supple. No JVD present. No tracheal deviation present. No thyromegaly present.  Cardiovascular: Normal rate, regular rhythm, normal heart sounds and intact distal pulses.   No murmur heard. Pulmonary/Chest: Effort normal and breath sounds normal. She has no wheezes. She exhibits no tenderness.  Abdominal: Soft. Bowel sounds are normal.  Musculoskeletal: Normal range of motion. She exhibits no edema and no tenderness.  Lymphadenopathy:    She has no cervical adenopathy.  Neurological: She  is alert and oriented to person, place, and time. She has normal reflexes. No cranial nerve deficit.  Skin: Skin is warm and dry. She is not diaphoretic.  Psychiatric: She has a normal mood and affect. Her behavior is normal.          Assessment & Plan:   This is a routine physical examination for this healthy  Female. Reviewed all health maintenance protocols including mammography colonoscopy bone density and reviewed appropriate screening labs. Her immunization history was reviewed as well as her current medications and allergies refills of her chronic medications were given and the plan for yearly health maintenance was discussed all orders and referrals were made as appropriate.  Gluten free diet

## 2013-09-01 ENCOUNTER — Other Ambulatory Visit: Payer: Self-pay

## 2013-09-01 ENCOUNTER — Other Ambulatory Visit: Payer: 59

## 2013-09-12 ENCOUNTER — Ambulatory Visit: Payer: 59 | Admitting: Internal Medicine

## 2013-09-13 ENCOUNTER — Other Ambulatory Visit: Payer: Self-pay | Admitting: Dermatology

## 2013-09-13 ENCOUNTER — Other Ambulatory Visit (INDEPENDENT_AMBULATORY_CARE_PROVIDER_SITE_OTHER): Payer: BC Managed Care – PPO

## 2013-09-13 DIAGNOSIS — Z0000 Encounter for general adult medical examination without abnormal findings: Secondary | ICD-10-CM

## 2013-09-13 DIAGNOSIS — K760 Fatty (change of) liver, not elsewhere classified: Secondary | ICD-10-CM

## 2013-09-13 DIAGNOSIS — K7689 Other specified diseases of liver: Secondary | ICD-10-CM

## 2013-09-13 LAB — HEPATIC FUNCTION PANEL
ALT: 44 U/L — ABNORMAL HIGH (ref 0–35)
AST: 31 U/L (ref 0–37)
Albumin: 4.7 g/dL (ref 3.5–5.2)
Alkaline Phosphatase: 51 U/L (ref 39–117)
Bilirubin, Direct: 0.1 mg/dL (ref 0.0–0.3)
Total Bilirubin: 1 mg/dL (ref 0.3–1.2)
Total Protein: 7.7 g/dL (ref 6.0–8.3)

## 2013-09-30 ENCOUNTER — Encounter: Payer: Self-pay | Admitting: Internal Medicine

## 2013-09-30 ENCOUNTER — Ambulatory Visit (INDEPENDENT_AMBULATORY_CARE_PROVIDER_SITE_OTHER): Payer: BC Managed Care – PPO | Admitting: Internal Medicine

## 2013-09-30 VITALS — BP 136/84 | HR 68 | Temp 98.2°F | Resp 16 | Ht 66.0 in | Wt 175.0 lb

## 2013-09-30 DIAGNOSIS — K7689 Other specified diseases of liver: Secondary | ICD-10-CM

## 2013-09-30 DIAGNOSIS — K760 Fatty (change of) liver, not elsewhere classified: Secondary | ICD-10-CM

## 2013-09-30 DIAGNOSIS — E669 Obesity, unspecified: Secondary | ICD-10-CM

## 2013-09-30 MED ORDER — LORCASERIN HCL 10 MG PO TABS: 1.0000 | ORAL_TABLET | Freq: Two times a day (BID) | ORAL | Status: DC

## 2013-09-30 NOTE — Progress Notes (Signed)
Pre visit review using our clinic review tool, if applicable. No additional management support is needed unless otherwise documented below in the visit note. 

## 2013-09-30 NOTE — Progress Notes (Signed)
Subjective:    Patient ID: Terry Bell, female    DOB: [birth year], 57 y.o.   MRN: [id number]  HPI Follow up for liver  Slight increase in weight and moderate increase in Lfts Needs to control no what she eats but how much   Review of Systems  Constitutional: Negative for activity change, appetite change and fatigue.  HENT: Negative for congestion, ear pain, postnasal drip and sinus pressure.   Eyes: Negative for redness and visual disturbance.  Respiratory: Negative for cough, shortness of breath and wheezing.   Gastrointestinal: Negative for abdominal pain and abdominal distention.  Genitourinary: Negative for dysuria, frequency and menstrual problem.  Musculoskeletal: Negative for arthralgias, joint swelling, myalgias and neck pain.  Skin: Negative for rash and wound.  Neurological: Negative for dizziness, weakness and headaches.  Hematological: Negative for adenopathy. Does not bruise/bleed easily.  Psychiatric/Behavioral: Negative for sleep disturbance and decreased concentration.   Past Medical History  Diagnosis Date  . Depression   . Varicose veins   . Arthritis   . Diverticulosis   . Steatohepatitis     History   Social History  . Marital Status: Married    Spouse Name: N/A    Number of Children: N/A  . Years of Education: N/A   Occupational History  . Not on file.   Social History Main Topics  . Smoking status: Never Smoker   . Smokeless tobacco: Not on file  . Alcohol Use: No  . Drug Use: No  . Sexual Activity: Not on file   Other Topics Concern  . Not on file   Social History Narrative  . No narrative on file    Past Surgical History  Procedure Laterality Date  . Torn cartledge in rt knee    . Colonsocopy    . Cholecystectomy    . Rt knee arthroscopy    . Bilateral bunionectomy    . Debridement and removal and lateral release of torn cartlidge in rt knee    . Arthroscopic left knee sugery    . Gum grating      Family History    Problem Relation Age of Onset  . Diabetes Mother   . Liver disease Mother   . Diabetes Sister   . Liver disease Sister   . Hypertension    . Cancer    . Hemochromatosis      Allergies  Allergen Reactions  . Niacin     REACTION: rash    Current Outpatient Prescriptions on File Prior to Visit  Medication Sig Dispense Refill  . acetaminophen (TYLENOL) 500 MG tablet Take 1 tablet (500 mg total) by mouth every 6 (six) hours as needed for pain.  30 tablet    . Cholecalciferol (VITAMIN D) 1000 UNITS capsule Take 1,000 Units by mouth daily.        . mometasone (NASONEX) 50 MCG/ACT nasal spray Place 2 sprays into the nose as needed.  17 g  6  . Omega-3 Fatty Acids (FISH OIL) 1200 MG CAPS Take by mouth.       . vitamin E 400 UNIT capsule Take 800 Units by mouth.        No current facility-administered medications on file prior to visit.    BP 136/84  Pulse 68  Temp(Src) 98.2 F (36.8 C)  Resp 16  Ht 6' (1.829 m)  Wt 175 lb (79.379 kg)  BMI 23.73 kg/m2        Objective:   Physical Exam  Nursing  note and vitals reviewed. Constitutional: She appears well-developed and well-nourished.  HENT:  Head: Normocephalic and atraumatic.  Cardiovascular: Normal rate.   Pulmonary/Chest: Effort normal and breath sounds normal.  Abdominal: Soft.  Neurological: She is alert.          Assessment & Plan:  Fatty liver And multiple  Family with liver cancer Has strong family history Seeing the hepatologist Clean EGD and [place] to make sure not developing more than minimal cirrhosis  Weight loss key

## 2013-09-30 NOTE — Patient Instructions (Signed)
The patient is instructed to continue all medications as prescribed. Schedule followup with check out clerk upon leaving the clinic  

## 2013-10-10 ENCOUNTER — Encounter: Payer: Self-pay | Admitting: Internal Medicine

## 2013-10-10 ENCOUNTER — Telehealth: Payer: Self-pay | Admitting: Internal Medicine

## 2013-10-10 NOTE — Telephone Encounter (Signed)
I received a fax from [place] stating Belviq is being denied.  I will give you a copy of the letter I received.

## 2013-10-10 NOTE — Telephone Encounter (Signed)
Wrong bmi- will try again with pa

## 2014-01-02 ENCOUNTER — Other Ambulatory Visit: Payer: Self-pay

## 2014-01-02 DIAGNOSIS — Z1231 Encounter for screening mammogram for malignant neoplasm of breast: Secondary | ICD-10-CM

## 2014-02-14 ENCOUNTER — Other Ambulatory Visit: Payer: Self-pay | Admitting: Internal Medicine

## 2014-02-14 ENCOUNTER — Ambulatory Visit
Admission: RE | Admit: 2014-02-14 | Discharge: 2014-02-14 | Disposition: A | Discharge status: Home / Self Care | Payer: BC Managed Care – PPO | Source: Ambulatory Visit

## 2014-02-14 ENCOUNTER — Telehealth: Payer: Self-pay | Admitting: Internal Medicine

## 2014-02-14 ENCOUNTER — Ambulatory Visit
Admission: RE | Admit: 2014-02-14 | Discharge: 2014-02-14 | Disposition: A | Discharge status: Home / Self Care | Payer: BC Managed Care – PPO | Source: Ambulatory Visit | Attending: Internal Medicine | Admitting: Internal Medicine

## 2014-02-14 ENCOUNTER — Ambulatory Visit
Admission: RE | Admit: 2014-02-14 | Discharge: 2014-02-14 | Disposition: A | Discharge status: Home / Self Care | Payer: Self-pay | Source: Ambulatory Visit

## 2014-02-14 DIAGNOSIS — N63 Unspecified lump in unspecified breast: Secondary | ICD-10-CM

## 2014-02-14 DIAGNOSIS — N644 Mastodynia: Secondary | ICD-10-CM

## 2014-02-14 DIAGNOSIS — Z1231 Encounter for screening mammogram for malignant neoplasm of breast: Secondary | ICD-10-CM

## 2014-02-14 NOTE — Telephone Encounter (Signed)
Pt is having issues w/ breat pain and a lump/mass. Pt needs diagnostic mamm asap. Could you put in referral for pt so she can go, today if possible

## 2014-02-17 NOTE — Telephone Encounter (Signed)
Referral placed.

## 2014-03-21 ENCOUNTER — Other Ambulatory Visit (INDEPENDENT_AMBULATORY_CARE_PROVIDER_SITE_OTHER): Payer: BC Managed Care – PPO

## 2014-03-21 DIAGNOSIS — Z0000 Encounter for general adult medical examination without abnormal findings: Secondary | ICD-10-CM

## 2014-03-21 LAB — POCT URINALYSIS DIPSTICK
Bilirubin, UA: NEGATIVE
Blood, UA: NEGATIVE
Glucose, UA: NEGATIVE
Ketones, UA: NEGATIVE
Nitrite, UA: NEGATIVE
Spec Grav, UA: 1.02
Urobilinogen, UA: 0.2
pH, UA: 7

## 2014-03-21 LAB — LIPID PANEL
Cholesterol: 126 mg/dL (ref 0–200)
HDL: 56.8 mg/dL (ref >39.00)
LDL Cholesterol: 63 mg/dL (ref 0–99)
Total CHOL/HDL Ratio: 2
Triglycerides: 31 mg/dL (ref 0.0–149.0)
VLDL: 6.2 mg/dL (ref 0.0–40.0)

## 2014-03-21 LAB — TSH: TSH: 2.44 u[IU]/mL (ref 0.35–4.50)

## 2014-03-21 LAB — BASIC METABOLIC PANEL
BUN: 17 mg/dL (ref 6–23)
CO2: 28 mEq/L (ref 19–32)
Calcium: 9.2 mg/dL (ref 8.4–10.5)
Chloride: 107 mEq/L (ref 96–112)
Creatinine, Ser: 0.7 mg/dL (ref 0.4–1.2)
GFR: 87.11 mL/min (ref >60.00)
Glucose, Bld: 93 mg/dL (ref 70–99)
Potassium: 3.9 mEq/L (ref 3.5–5.1)
Sodium: 140 mEq/L (ref 135–145)

## 2014-03-21 LAB — CBC WITH DIFFERENTIAL/PLATELET
Basophils Absolute: 0 10*3/uL (ref 0.0–0.1)
Basophils Relative: 0.5 % (ref 0.0–3.0)
Eosinophils Absolute: 0.1 10*3/uL (ref 0.0–0.7)
Eosinophils Relative: 1.7 % (ref 0.0–5.0)
HCT: 42 % (ref 36.0–46.0)
Hemoglobin: 14.5 g/dL (ref 12.0–15.0)
Lymphocytes Relative: 40 % (ref 12.0–46.0)
Lymphs Abs: 1.3 10*3/uL (ref 0.7–4.0)
MCHC: 34.5 g/dL (ref 30.0–36.0)
MCV: 89.3 fl (ref 78.0–100.0)
Monocytes Absolute: 0.3 10*3/uL (ref 0.1–1.0)
Monocytes Relative: 9.3 % (ref 3.0–12.0)
Neutro Abs: 1.6 10*3/uL (ref 1.4–7.7)
Neutrophils Relative %: 48.5 % (ref 43.0–77.0)
Platelets: 121 10*3/uL — ABNORMAL LOW (ref 150.0–400.0)
RBC: 4.7 Mil/uL (ref 3.87–5.11)
RDW: 12.4 % (ref 11.5–15.5)
WBC: 3.3 10*3/uL — ABNORMAL LOW (ref 4.0–10.5)

## 2014-03-21 LAB — HEPATIC FUNCTION PANEL
ALT: 35 U/L (ref 0–35)
AST: 30 U/L (ref 0–37)
Albumin: 4 g/dL (ref 3.5–5.2)
Alkaline Phosphatase: 40 U/L (ref 39–117)
Bilirubin, Direct: 0.2 mg/dL (ref 0.0–0.3)
Total Bilirubin: 1 mg/dL (ref 0.2–1.2)
Total Protein: 6.8 g/dL (ref 6.0–8.3)

## 2014-04-03 ENCOUNTER — Ambulatory Visit (INDEPENDENT_AMBULATORY_CARE_PROVIDER_SITE_OTHER): Payer: BC Managed Care – PPO | Admitting: Internal Medicine

## 2014-04-03 ENCOUNTER — Encounter: Payer: Self-pay | Admitting: Internal Medicine

## 2014-04-03 ENCOUNTER — Other Ambulatory Visit (HOSPITAL_COMMUNITY)
Admission: RE | Admit: 2014-04-03 | Discharge: 2014-04-03 | Disposition: A | Discharge status: Home / Self Care | Payer: BC Managed Care – PPO | Source: Ambulatory Visit | Attending: Internal Medicine | Admitting: Internal Medicine

## 2014-04-03 VITALS — BP 110/70 | HR 68 | Temp 98.6°F | Ht 65.5 in | Wt 172.0 lb

## 2014-04-03 DIAGNOSIS — Z01419 Encounter for gynecological examination (general) (routine) without abnormal findings: Secondary | ICD-10-CM | POA: Insufficient documentation

## 2014-04-03 DIAGNOSIS — K760 Fatty (change of) liver, not elsewhere classified: Secondary | ICD-10-CM

## 2014-04-03 DIAGNOSIS — K7689 Other specified diseases of liver: Secondary | ICD-10-CM

## 2014-04-03 NOTE — Patient Instructions (Signed)
Either Dr Maudie Mercury or Dr Jake Bathe at [facility]  Dr Yong Channel for your husband

## 2014-04-03 NOTE — Addendum Note (Signed)
Addended by: Westley Hummer B on: 04/03/2014 04:42 PM   Modules accepted: Orders

## 2014-04-03 NOTE — Progress Notes (Signed)
Subjective:    Patient ID: Terry Bell, female    DOB: Mar 19, 1956, 58 y.o.   MRN: [id number]  HPI CPX Hx of fatty liver reviewed labs On probiotics    Review of Systems  Constitutional: Negative for activity change, appetite change and fatigue.  HENT: Negative for congestion, ear pain, postnasal drip and sinus pressure.   Eyes: Negative for redness and visual disturbance.  Respiratory: Negative for cough, shortness of breath and wheezing.   Gastrointestinal: Negative for abdominal pain and abdominal distention.  Genitourinary: Negative for dysuria, frequency and menstrual problem.  Musculoskeletal: Negative for arthralgias, joint swelling, myalgias and neck pain.  Skin: Negative for rash and wound.  Neurological: Negative for dizziness, weakness and headaches.  Hematological: Negative for adenopathy. Does not bruise/bleed easily.  Psychiatric/Behavioral: Negative for sleep disturbance and decreased concentration.   The patient is instructed to continue all medications as prescribed. Schedule followup with check out clerk upon leaving the clinic Past Medical History  Diagnosis Date  . Depression   . Varicose veins   . Arthritis   . Diverticulosis   . Steatohepatitis     History   Social History  . Marital Status: Married    Spouse Name: N/A    Number of Children: N/A  . Years of Education: N/A   Occupational History  . Not on file.   Social History Main Topics  . Smoking status: Never Smoker   . Smokeless tobacco: Not on file  . Alcohol Use: No  . Drug Use: No  . Sexual Activity: Not on file   Other Topics Concern  . Not on file   Social History Narrative  . No narrative on file    Past Surgical History  Procedure Laterality Date  . Torn cartledge in rt knee    . Colonsocopy    . Cholecystectomy    . Rt knee arthroscopy    . Bilateral bunionectomy    . Debridement and removal and lateral release of torn cartlidge in rt knee    . Arthroscopic  left knee sugery    . Gum grating      Family History  Problem Relation Age of Onset  . Diabetes Mother   . Liver disease Mother   . Diabetes Sister   . Liver disease Sister   . Hypertension    . Cancer    . Hemochromatosis      Allergies  Allergen Reactions  . Niacin     REACTION: rash    Current Outpatient Prescriptions on File Prior to Visit  Medication Sig Dispense Refill  . calcium carbonate (OS-CAL) 600 MG TABS tablet Take 600 mg by mouth daily.      . Cholecalciferol (VITAMIN D) 1000 UNITS capsule Take 1,000 Units by mouth daily.        . Lorcaserin HCl 10 MG TABS Take 1 tablet by mouth 2 (two) times daily.  60 tablet  5  . mometasone (NASONEX) 50 MCG/ACT nasal spray Place 2 sprays into the nose as needed.  17 g  6  . Omega-3 Fatty Acids (FISH OIL) 1200 MG CAPS Take by mouth.       . Probiotic Product (ALIGN) 4 MG CAPS Take 4 mg by mouth daily.      . vitamin E 400 UNIT capsule Take 800 Units by mouth.        No current facility-administered medications on file prior to visit.    BP 110/70  Pulse 68  Temp(Src) 98.6  F (37 C) (Oral)  Ht 5' 5.5" (1.664 m)  Wt 172 lb (78.019 kg)  BMI 28.18 kg/m2        Objective:   Physical Exam  Vitals reviewed. Constitutional: She is oriented to person, place, and time. She appears well-developed and well-nourished. No distress.  HENT:  Head: Normocephalic and atraumatic.  Eyes: Conjunctivae and EOM are normal. Pupils are equal, round, and reactive to light.  Neck: Normal range of motion. Neck supple. No JVD present. No tracheal deviation present. No thyromegaly present.  Cardiovascular: Normal rate and regular rhythm.   No murmur heard. Pulmonary/Chest: Effort normal and breath sounds normal. She has no wheezes. She exhibits no tenderness.  Abdominal: Soft. Bowel sounds are normal.  Musculoskeletal: Normal range of motion. She exhibits no edema and no tenderness.  Lymphadenopathy:    She has no cervical adenopathy.    Neurological: She is alert and oriented to person, place, and time. She has normal reflexes. No cranial nerve deficit.  Skin: Skin is warm and dry. She is not diaphoretic.  Psychiatric: She has a normal mood and affect. Her behavior is normal.          Assessment & Plan:   This is a routine wellness  examination for this patient . I reviewed all health maintenance protocols including mammography, colonoscopy, bone density Needed referrals were placed. Age and diagnosis  appropriate screening labs were ordered. Her immunization history was reviewed and appropriate vaccinations were ordered. Her current medications and allergies were reviewed and needed refills of her chronic medications were ordered. The plan for yearly health maintenance was discussed all orders and referrals were made as appropriate.   Liver stable weigth loss great and following paleo diet!  Her family has multiple NASH cases and is followed by the [facility]!

## 2014-04-03 NOTE — Progress Notes (Signed)
Pre visit review using our clinic review tool, if applicable. No additional management support is needed unless otherwise documented below in the visit note. 

## 2014-04-05 LAB — CYTOLOGY - PAP

## 2014-04-17 ENCOUNTER — Other Ambulatory Visit: Payer: Self-pay | Admitting: Internal Medicine

## 2014-04-19 NOTE — Telephone Encounter (Signed)
Not on med list

## 2014-09-26 ENCOUNTER — Encounter: Payer: Self-pay | Admitting: *Deleted

## 2014-09-26 ENCOUNTER — Other Ambulatory Visit (INDEPENDENT_AMBULATORY_CARE_PROVIDER_SITE_OTHER): Payer: BC Managed Care – PPO

## 2014-09-26 DIAGNOSIS — K760 Fatty (change of) liver, not elsewhere classified: Secondary | ICD-10-CM

## 2014-09-26 LAB — HEPATIC FUNCTION PANEL
ALT: 43 U/L — ABNORMAL HIGH (ref 0–35)
AST: 29 U/L (ref 0–37)
Albumin: 4.4 g/dL (ref 3.5–5.2)
Alkaline Phosphatase: 54 U/L (ref 39–117)
Bilirubin, Direct: 0.1 mg/dL (ref 0.0–0.3)
Total Bilirubin: 0.8 mg/dL (ref 0.2–1.2)
Total Protein: 7.3 g/dL (ref 6.0–8.3)

## 2014-10-03 ENCOUNTER — Encounter: Payer: Self-pay | Admitting: Family Medicine

## 2014-10-03 ENCOUNTER — Ambulatory Visit (INDEPENDENT_AMBULATORY_CARE_PROVIDER_SITE_OTHER): Payer: BC Managed Care – PPO | Admitting: Family Medicine

## 2014-10-03 VITALS — BP 118/82 | HR 71 | Temp 98.0°F | Ht 65.5 in | Wt 183.0 lb

## 2014-10-03 DIAGNOSIS — K7581 Nonalcoholic steatohepatitis (NASH): Secondary | ICD-10-CM

## 2014-10-03 DIAGNOSIS — J309 Allergic rhinitis, unspecified: Secondary | ICD-10-CM

## 2014-10-03 DIAGNOSIS — E663 Overweight: Secondary | ICD-10-CM

## 2014-10-03 MED ORDER — MOMETASONE FUROATE 50 MCG/ACT NA SUSP: 2.0000 | NASAL | Status: DC | PRN

## 2014-10-03 NOTE — Progress Notes (Signed)
Pre visit review using our clinic review tool, if applicable. No additional management support is needed unless otherwise documented below in the visit note. 

## 2014-10-03 NOTE — Patient Instructions (Signed)
We recommend the following healthy lifestyle measures: - eat a healthy diet consisting of lots of vegetables, fruits, beans, nuts, seeds, healthy meats such as white chicken and fish and whole grains.  - avoid fried foods, fast food, processed foods, sodas, red meet and other fattening foods.  - get a least 150 minutes of aerobic exercise per week.

## 2014-10-03 NOTE — Progress Notes (Signed)
HPI:  Follow up:  Note: prior patient of Dr. Arnoldo Morale whom wants to transfer to me. 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  Overweight: -on belviq in the past -on paleo diet with some cream and dairy -not exercising - she plans to start walking again and go to the gyn  Allergic Rhinitis: -uses INS seasonally -doing well  NASH: -sees hepatologist for this as has a family hx of hepatic cancer - monitored with labs and [place] q 6 months and endoscopy, reports (Dr. Mosetta Pigeon with [facility]) -diet and exercise: paleo,  -denies: abd pain, nausea, vomiting   ROS: See pertinent positives and negatives per HPI.  Past Medical History  Diagnosis Date  . Depression   . Varicose veins   . Arthritis   . Diverticulosis   . Steatohepatitis     Past Surgical History  Procedure Laterality Date  . Torn cartledge in rt knee    . Colonsocopy    . Cholecystectomy    . Rt knee arthroscopy    . Bilateral bunionectomy    . Debridement and removal and lateral release of torn cartlidge in rt knee    . Arthroscopic left knee sugery    . Gum grating      Family History  Problem Relation Age of Onset  . Diabetes Mother   . Liver disease Mother   . Diabetes Sister   . Liver disease Sister   . Hypertension    . Cancer    . Hemochromatosis      History   Social History  . Marital Status: Married    Spouse Name: N/A    Number of Children: N/A  . Years of Education: N/A   Social History Main Topics  . Smoking status: Never Smoker   . Smokeless tobacco: None  . Alcohol Use: No  . Drug Use: No  . Sexual Activity: None   Other Topics Concern  . None   Social History Narrative    Current outpatient prescriptions: calcium carbonate (OS-CAL) 600 MG TABS tablet, Take 600 mg by mouth daily., Disp: , Rfl: ;  Cholecalciferol (VITAMIN D) 1000 UNITS capsule, Take 1,000 Units by mouth daily.  , Disp: , Rfl: ;  mometasone (NASONEX) 50 MCG/ACT nasal spray, Place 2 sprays into  the nose as needed., Disp: 17 g, Rfl: 6;  Probiotic Product (ALIGN) 4 MG CAPS, Take 4 mg by mouth daily., Disp: , Rfl:  vitamin E 400 UNIT capsule, Take 800 Units by mouth. , Disp: , Rfl:   EXAM:  Filed Vitals:   10/03/14 0849  BP: 118/82  Pulse: 71  Temp: 98 F (36.7 C)    Body mass index is 29.98 kg/(m^2).  GENERAL: vitals reviewed and listed above, alert, oriented, appears well hydrated and in no acute distress  HEENT: atraumatic, conjunttiva clear, no obvious abnormalities on inspection of external nose and ears  NECK: no obvious masses on inspection  LUNGS: clear to auscultation bilaterally, no wheezes, rales or rhonchi, good air movement  CV: HRRR, no peripheral edema  MS: moves all extremities without noticeable abnormality  PSYCH: pleasant and cooperative, no obvious depression or anxiety  ASSESSMENT AND PLAN:  Discussed the following assessment and plan:  Overweight - Plan: mometasone (NASONEX) 50 MCG/ACT nasal spray  Allergic rhinitis, unspecified allergic rhinitis type  NASH (nonalcoholic steatohepatitis)   -follow up in 6 months - advised the safest and healthiest way to improve your physical and mental health is to: - eat  a healthy diet consisting of lots of vegetables, fruits, beans, nuts, seeds, healthy meats such as white chicken and fish and whole grains.  - avoid fried foods, fast food, processed foods, sodas, red meet and other fattening foods.  - get a least 150-300 minutes of aerobic exercise per week.  -reduce stress - counseling, meditation, relaxation to balance other aspects of your life   -Patient advised to return or notify a doctor immediately if symptoms worsen or persist or new concerns arise.  Patient Instructions  We recommend the following healthy lifestyle measures: - eat a healthy diet consisting of lots of vegetables, fruits, beans, nuts, seeds, healthy meats such as white chicken and fish and whole grains.  - avoid fried foods,  fast food, processed foods, sodas, red meet and other fattening foods.  - get a least 150 minutes of aerobic exercise per week.         Colin Benton R.

## 2015-04-04 ENCOUNTER — Encounter: Payer: Self-pay | Admitting: Family Medicine

## 2015-04-04 ENCOUNTER — Ambulatory Visit (INDEPENDENT_AMBULATORY_CARE_PROVIDER_SITE_OTHER): Payer: 59 | Admitting: Family Medicine

## 2015-04-04 VITALS — BP 100/76 | HR 90 | Temp 98.2°F | Ht 66.0 in | Wt 180.1 lb

## 2015-04-04 DIAGNOSIS — N3946 Mixed incontinence: Secondary | ICD-10-CM

## 2015-04-04 DIAGNOSIS — J309 Allergic rhinitis, unspecified: Secondary | ICD-10-CM

## 2015-04-04 DIAGNOSIS — H9319 Tinnitus, unspecified ear: Secondary | ICD-10-CM

## 2015-04-04 DIAGNOSIS — Z0000 Encounter for general adult medical examination without abnormal findings: Secondary | ICD-10-CM | POA: Diagnosis not present

## 2015-04-04 DIAGNOSIS — K7581 Nonalcoholic steatohepatitis (NASH): Secondary | ICD-10-CM

## 2015-04-04 NOTE — Progress Notes (Signed)
HPI:  Here for CPE:  -Concerns and/or follow up today:   Overweight: -on belviq in the past -on paleo diet with some cream and dairy -not exercising - she plans to start walking again and go to the gyn  Allergic Rhinitis/Tinnitus: -uses INS seasonally -has had mild tinnitus for at least 5 years in the R>L. High pitch, ringing, mild hearing issues -doing well otherwise  NASH: -sees hepatologist for this as has a family hx of hepatic cancer - monitored with labs and [place] q 6 months and endoscopy (Dr. Mosetta Pigeon with [facility]) -diet and exercise: paleo -denies: abd pain, nausea, vomiting  Urinary urgency: -for a very long at least 5 years, mild urgency -tried vesicare with prior PCP which helped -occ mild leaking prior to getting to the bathroom, occ leaking with  -no recurrent UTI, leaking otherwise, sig incontinence  -Diet: variety of foods, balance and well rounded, larger portion sizes - weight watchers  -Exercise: regular exercise  -Taking folic acid, vitamin D or calcium: no  -Diabetes and Dyslipidemia Screening:  -Hx of HTN: no  -Vaccines: UTD  -pap history: done in 2015, always normal  -FDLMP: n/a  -sexual activity: yes, female partner, no new partners  -wants STI testing (Hep C if born [birth year]): no - done  -FH breast, colon or ovarian ca: see FH Last mammogram: advised to schedule mammogram Last colon cancer screening: done  Breast Ca Risk Assessment: -no sig risks, no FH with breast or ovarian ca  -Alcohol, Tobacco, drug use: see social history  Review of Systems - no fevers, unintentional weight loss, vision loss, hearing loss, chest pain, sob, hemoptysis, melena, hematochezia, hematuria, genital discharge, changing or concerning skin lesions, bleeding, bruising, loc, thoughts of self harm or SI  Past Medical History  Diagnosis Date  . Depression   . Varicose veins   . Arthritis   . Diverticulosis   . Steatohepatitis   . Tinnitus   .  Continuous leakage of urine     Past Surgical History  Procedure Laterality Date  . Torn cartledge in rt knee    . Colonsocopy    . Cholecystectomy    . Rt knee arthroscopy    . Bilateral bunionectomy    . Debridement and removal and lateral release of torn cartlidge in rt knee    . Arthroscopic left knee sugery    . Gum grating      Family History  Problem Relation Age of Onset  . Diabetes Mother   . Liver disease Mother   . Diabetes Sister   . Liver disease Sister   . Hypertension    . Cancer    . Hemochromatosis      History   Social History  . Marital Status: Married    Spouse Name: N/A  . Number of Children: N/A  . Years of Education: N/A   Social History Main Topics  . Smoking status: Never Smoker   . Smokeless tobacco: Not on file  . Alcohol Use: No  . Drug Use: No  . Sexual Activity: Not on file   Other Topics Concern  . None   Social History Narrative     Current outpatient prescriptions:  .  calcium carbonate (OS-CAL) 600 MG TABS tablet, Take 600 mg by mouth daily., Disp: , Rfl:  .  Cholecalciferol (VITAMIN D) 1000 UNITS capsule, Take 1,000 Units by mouth daily.  , Disp: , Rfl:  .  mometasone (NASONEX) 50 MCG/ACT nasal spray, Place 2 sprays into  the nose as needed., Disp: 17 g, Rfl: 6 .  Probiotic Product (ALIGN) 4 MG CAPS, Take 4 mg by mouth daily., Disp: , Rfl:  .  vitamin E 400 UNIT capsule, Take 800 Units by mouth. , Disp: , Rfl:   EXAM:  Filed Vitals:   04/04/15 0807  BP: 100/76  Pulse: 90  Temp: 98.2 F (36.8 C)    GENERAL: vitals reviewed and listed below, alert, oriented, appears well hydrated and in no acute distress  HEENT: head atraumatic, PERRLA, normal appearance of eyes, ears, nose and mouth. moist mucus membranes.  NECK: supple, no masses or lymphadenopathy  LUNGS: clear to auscultation bilaterally, no rales, rhonchi or wheeze  CV: HRRR, no peripheral edema or cyanosis, normal pedal pulses  BREAST:  declined  ABDOMEN: bowel sounds normal, soft, non tender to palpation, no masses, no rebound or guarding  GU: declined  SKIN: no rash or abnormal lesions  MS: normal gait, moves all extremities normally  NEURO: CN II-XII grossly intact, normal muscle strength and sensation to light touch on extremities  PSYCH: normal affect, pleasant and cooperative  ASSESSMENT AND PLAN:  Discussed the following assessment and plan:  Encounter for preventive health examination  MIXED INCONTINENCE URGE AND STRESS -discussed options -she prefers to start with timed voiding and kegels and may consider vsicare again as this was helpful in the past  Tinnitus, unspecified laterality -she would like to have eval with ENT and information provided to schedule  Allergic rhinitis, unspecified allergic rhinitis type -stable  NASH (nonalcoholic steatohepatitis) -managed by her specilist  -Discussed and advised all [place] preventive services health task force level A and B recommendations for age, sex and risks.  -Advised at least 150 minutes of exercise per week and a healthy diet low in saturated fats and sweets and consisting of fresh fruits and vegetables, lean meats such as fish and white chicken and whole grains.  -labs - she opted not to do any labs, does q 6 month labs with hepatologist and had normal lipids and glu last year, studies and vaccines per orders this encounter  No orders of the defined types were placed in this encounter.    Patient advised to return to clinic immediately if symptoms worsen or persist or new concerns.  Patient Instructions  Schedule your mammogram  Vit D3 [phone] IU daily; adequate dietary intake of calcium (1229m)  Let me know if you wish to do a bone density  Follow up on the urinary issues and tinnitus as needed  We recommend the following healthy lifestyle measures: - eat a healthy diet consisting of lots of vegetables, fruits, beans, nuts, seeds,  healthy meats such as white chicken and fish  - avoid starches and sweets, fried foods, fast food, processed foods, sodas, excessive red meet and other fattening foods.  - get a least 150 minutes of aerobic exercise per week.   Follow up yearly      No Follow-up on file.  KColin BentonR.

## 2015-04-04 NOTE — Patient Instructions (Signed)
Schedule your mammogram  Vit D3 [phone] IU daily; adequate dietary intake of calcium (1200mg )  Let me know if you wish to do a bone density  Follow up on the urinary issues and tinnitus as needed  We recommend the following healthy lifestyle measures: - eat a healthy diet consisting of lots of vegetables, fruits, beans, nuts, seeds, healthy meats such as white chicken and fish  - avoid starches and sweets, fried foods, fast food, processed foods, sodas, excessive red meet and other fattening foods.  - get a least 150 minutes of aerobic exercise per week.   Follow up yearly

## 2015-04-04 NOTE — Progress Notes (Signed)
Pre visit review using our clinic review tool, if applicable. No additional management support is needed unless otherwise documented below in the visit note. 

## 2015-04-11 ENCOUNTER — Other Ambulatory Visit: Payer: Self-pay

## 2015-04-11 DIAGNOSIS — Z1231 Encounter for screening mammogram for malignant neoplasm of breast: Secondary | ICD-10-CM

## 2015-05-02 ENCOUNTER — Ambulatory Visit
Admission: RE | Admit: 2015-05-02 | Discharge: 2015-05-02 | Disposition: A | Discharge status: Home / Self Care | Payer: 59 | Source: Ambulatory Visit

## 2015-05-02 DIAGNOSIS — Z1231 Encounter for screening mammogram for malignant neoplasm of breast: Secondary | ICD-10-CM

## 2015-10-11 ENCOUNTER — Encounter: Payer: Self-pay | Admitting: Family Medicine

## 2015-10-11 ENCOUNTER — Ambulatory Visit (INDEPENDENT_AMBULATORY_CARE_PROVIDER_SITE_OTHER): Payer: 59 | Admitting: Family Medicine

## 2015-10-11 VITALS — BP 100/80 | HR 100 | Temp 98.1°F | Ht 66.0 in | Wt 182.1 lb

## 2015-10-11 DIAGNOSIS — M545 Low back pain, unspecified: Secondary | ICD-10-CM

## 2015-10-11 DIAGNOSIS — R030 Elevated blood-pressure reading, without diagnosis of hypertension: Secondary | ICD-10-CM

## 2015-10-11 DIAGNOSIS — IMO0001 Reserved for inherently not codable concepts without codable children: Secondary | ICD-10-CM

## 2015-10-11 MED ORDER — CYCLOBENZAPRINE HCL 10 MG PO TABS: 10.0000 mg | ORAL_TABLET | Freq: Every day | ORAL | Status: DC

## 2015-10-11 NOTE — Progress Notes (Signed)
HPI:  Acute visit for:  Low back pain: -started about 1 month ago [place] -dull 3/10 pain in R low back -was worse initially, now improving -has not taken medications as not bad, worse with bending and squatting -denies: radiation, malaise, fevers, weakness or numbness in extremities, loss of bowel or bladder control  Elevated blood pressure: -asymptomatic -noted on check in  ROS: See pertinent positives and negatives per HPI.  Past Medical History  Diagnosis Date  . Depression   . Varicose veins   . Arthritis   . Diverticulosis   . Steatohepatitis   . Tinnitus   . Continuous leakage of urine     Past Surgical History  Procedure Laterality Date  . Torn cartledge in rt knee    . Colonsocopy    . Cholecystectomy    . Rt knee arthroscopy    . Bilateral bunionectomy    . Debridement and removal and lateral release of torn cartlidge in rt knee    . Arthroscopic left knee sugery    . Gum grating      Family History  Problem Relation Age of Onset  . Diabetes Mother   . Liver disease Mother   . Diabetes Sister   . Liver disease Sister   . Hypertension    . Cancer    . Hemochromatosis      Social History   Social History  . Marital Status: Married    Spouse Name: N/A  . Number of Children: N/A  . Years of Education: N/A   Social History Main Topics  . Smoking status: Never Smoker   . Smokeless tobacco: None  . Alcohol Use: No  . Drug Use: No  . Sexual Activity: Not Asked   Other Topics Concern  . None   Social History Narrative     Current outpatient prescriptions:  .  calcium carbonate (OS-CAL) 600 MG TABS tablet, Take 600 mg by mouth daily., Disp: , Rfl:  .  Cholecalciferol (VITAMIN D) 1000 UNITS capsule, Take 1,000 Units by mouth daily.  , Disp: , Rfl:  .  mometasone (NASONEX) 50 MCG/ACT nasal spray, Place 2 sprays into the nose as needed., Disp: 17 g, Rfl: 6 .  Probiotic Product (ALIGN) 4 MG CAPS,  Take 4 mg by mouth daily., Disp: , Rfl:  .  vitamin E 400 UNIT capsule, Take 800 Units by mouth. , Disp: , Rfl:  .  cyclobenzaprine (FLEXERIL) 10 MG tablet, Take 1 tablet (10 mg total) by mouth at bedtime., Disp: 20 tablet, Rfl: 0  EXAM:  Filed Vitals:   10/11/15 1530  BP: 118/90  Pulse: 100  Temp: 98.1 F (36.7 C)    Body mass index is 29.41 kg/(m^2).  GENERAL: vitals reviewed and listed above, alert, oriented, appears well hydrated and in no acute distress  HEENT: atraumatic, conjunttiva clear, no obvious abnormalities on inspection of external nose and ears  NECK: no obvious masses on inspection  LUNGS: clear to auscultation bilaterally, no wheezes, rales or rhonchi, good air movement  CV: HRRR, no peripheral edema  MS: moves all extremities without noticeable abnormality  Normal Gait Normal inspection of back, no obvious scoliosis or leg length descrepancy  TTP at: R lumbar paraspinal muscles and over facet joints at L4-L5 level -/+ tests: neg trendelenburg,-facet loading, -SLRT, -CLRT, -FABER, -FADIR Normal muscle strength, sensation to light touch and DTRs in LEs bilaterally  PSYCH: pleasant and cooperative, no obvious depression or anxiety  ASSESSMENT AND PLAN:  Discussed the following assessment and plan:  Right-sided low back pain without sciatica -we discussed possible serious and likely etiologies, workup and treatment, treatment risks and return precautions --she has OA of the knees and I suspect OA of back as well vs muscle strain aggravated by the hiking and camping -after this discussion, Terry Bell opted for we opted since improving for HEP, muscle relaxer, heat and close follow up -of course, we advised Terry Bell  to return or notify a doctor immediately if symptoms worsen or persist or new concerns arise.  Elevated blood pressure -recheck seated, monitor at follow up  -Patient advised to return or notify a doctor immediately if symptoms worsen or persist or  new concerns arise.  Patient Instructions  BEFORE YOU LEAVE: -recheck BP -low back exercises -follow up in 3-4 weeks, sooner if any concerns  Do the exercises at 4 days per week  Flexeril nightly before bed  Heat for 15 minutes twice daily  Tylenol 563m or Aleve 200-4014mup to twice daily as needed for pain     KIM, HAJarrett Soho.

## 2015-10-11 NOTE — Patient Instructions (Signed)
BEFORE YOU LEAVE: -recheck BP -low back exercises -follow up in 3-4 weeks, sooner if any concerns  Do the exercises at 4 days per week  Flexeril nightly before bed  Heat for 15 minutes twice daily  Tylenol 500mg  or Aleve 200-400mg  up to twice daily as needed for pain

## 2015-10-11 NOTE — Progress Notes (Signed)
Pre visit review using our clinic review tool, if applicable. No additional management support is needed unless otherwise documented below in the visit note. 

## 2015-11-08 ENCOUNTER — Encounter: Payer: Self-pay | Admitting: Family Medicine

## 2015-11-08 ENCOUNTER — Ambulatory Visit (INDEPENDENT_AMBULATORY_CARE_PROVIDER_SITE_OTHER): Payer: 59 | Admitting: Family Medicine

## 2015-11-08 VITALS — BP 102/80 | HR 77 | Temp 98.5°F | Ht 66.0 in | Wt 178.9 lb

## 2015-11-08 DIAGNOSIS — IMO0001 Reserved for inherently not codable concepts without codable children: Secondary | ICD-10-CM

## 2015-11-08 DIAGNOSIS — R030 Elevated blood-pressure reading, without diagnosis of hypertension: Secondary | ICD-10-CM

## 2015-11-08 DIAGNOSIS — M545 Low back pain: Secondary | ICD-10-CM

## 2015-11-08 NOTE — Progress Notes (Signed)
Pre visit review using our clinic review tool, if applicable. No additional management support is needed unless otherwise documented below in the visit note. 

## 2015-11-08 NOTE — Progress Notes (Signed)
HPI:  Low back pain: -started about 08/2015 after [place] [address] [place]  -10/11/15 advised HEP, muscle relaxer, heat -remarkable improvement "greater then 75% better" with only occ mild twinge of pain with certain movements -denies: radiation, malaise, fevers, weakness or numbness in extremities, loss of bowel or bladder control  Elevated blood pressure: -asymptomatic -noted on check in last visit -she has been working on diet and exercise   ROS: See pertinent positives and negatives per HPI.  Past Medical History  Diagnosis Date  . Depression   . Varicose veins   . Arthritis   . Diverticulosis   . Steatohepatitis   . Tinnitus   . Continuous leakage of urine     Past Surgical History  Procedure Laterality Date  . Torn cartledge in rt knee    . Colonsocopy    . Cholecystectomy    . Rt knee arthroscopy    . Bilateral bunionectomy    . Debridement and removal and lateral release of torn cartlidge in rt knee    . Arthroscopic left knee sugery    . Gum grating      Family History  Problem Relation Age of Onset  . Diabetes Mother   . Liver disease Mother   . Diabetes Sister   . Liver disease Sister   . Hypertension    . Cancer    . Hemochromatosis      Social History   Social History  . Marital Status: Married    Spouse Name: N/A  . Number of Children: N/A  . Years of Education: N/A   Social History Main Topics  . Smoking status: Never Smoker   . Smokeless tobacco: None  . Alcohol Use: No  . Drug Use: No  . Sexual Activity: Not Asked   Other Topics Concern  . 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  calcium carbonate (OS-CAL) 600 MG TABS tablet, Take 600 mg by mouth daily., Disp: , Rfl:  .  Cholecalciferol (VITAMIN D) 1000 UNITS capsule, Take 1,000 Units by mouth daily.  , Disp: , Rfl:  .  cyclobenzaprine  (FLEXERIL) 10 MG tablet, Take 1 tablet (10 mg total) by mouth at bedtime., Disp: 20 tablet, Rfl: 0 .  mometasone (NASONEX) 50 MCG/ACT nasal spray, Place 2 sprays into the nose as needed., Disp: 17 g, Rfl: 6 .  Probiotic Product (ALIGN) 4 MG CAPS, Take 4 mg by mouth daily., Disp: , Rfl:  .  vitamin E 400 UNIT capsule, Take 800 Units by mouth. , Disp: , Rfl:   EXAM:  Filed Vitals:   11/08/15 0916  BP: 102/80  Pulse: 77  Temp: 98.5 F (36.9 C)    Body mass index is 28.89 kg/(m^2).  GENERAL: vitals reviewed and listed above, alert, oriented, appears well hydrated and in no acute distress  HEENT: atraumatic, conjunttiva clear, no obvious abnormalities on inspection of external nose and ears  NECK: no obvious masses on inspection  LUNGS: clear to auscultation bilaterally, no wheezes, rales or rhonchi, good air movement  CV: HRRR, no peripheral edema  MS: moves all extremities without noticeable abnormality  PSYCH: pleasant and cooperative, no obvious depression or anxiety  ASSESSMENT AND PLAN:  Discussed the following assessment and plan:  Low back pain, unspecified back pain laterality, with sciatica presence unspecified  Elevated blood pressure  -  doing well with remarkable improvement in back pain - advised follow up if not completely resolved in 4 weeks -BP great today - advised lifestyle recs -Patient advised to return or notify a doctor immediately if symptoms worsen or persist or new concerns arise.  There are no Patient Instructions on file for this visit.   Colin Benton R.

## 2015-12-17 DIAGNOSIS — K760 Fatty (change of) liver, not elsewhere classified: Secondary | ICD-10-CM | POA: Diagnosis not present

## 2015-12-17 DIAGNOSIS — K7581 Nonalcoholic steatohepatitis (NASH): Secondary | ICD-10-CM | POA: Diagnosis not present

## 2015-12-20 ENCOUNTER — Encounter: Payer: Self-pay | Admitting: Family Medicine

## 2015-12-20 DIAGNOSIS — K7400 Hepatic fibrosis, unspecified: Secondary | ICD-10-CM | POA: Insufficient documentation

## 2016-01-23 DIAGNOSIS — D225 Melanocytic nevi of trunk: Secondary | ICD-10-CM | POA: Diagnosis not present

## 2016-01-23 DIAGNOSIS — D2271 Melanocytic nevi of right lower limb, including hip: Secondary | ICD-10-CM | POA: Diagnosis not present

## 2016-01-23 DIAGNOSIS — D2262 Melanocytic nevi of left upper limb, including shoulder: Secondary | ICD-10-CM | POA: Diagnosis not present

## 2016-01-23 DIAGNOSIS — L819 Disorder of pigmentation, unspecified: Secondary | ICD-10-CM | POA: Diagnosis not present

## 2016-01-23 DIAGNOSIS — D2361 Other benign neoplasm of skin of right upper limb, including shoulder: Secondary | ICD-10-CM | POA: Diagnosis not present

## 2016-01-23 DIAGNOSIS — D235 Other benign neoplasm of skin of trunk: Secondary | ICD-10-CM | POA: Diagnosis not present

## 2016-01-23 DIAGNOSIS — D2261 Melanocytic nevi of right upper limb, including shoulder: Secondary | ICD-10-CM | POA: Diagnosis not present

## 2016-01-23 DIAGNOSIS — D2272 Melanocytic nevi of left lower limb, including hip: Secondary | ICD-10-CM | POA: Diagnosis not present

## 2016-01-23 DIAGNOSIS — L814 Other melanin hyperpigmentation: Secondary | ICD-10-CM | POA: Diagnosis not present

## 2016-03-01 ENCOUNTER — Encounter: Payer: Self-pay | Admitting: Family Medicine

## 2016-03-01 ENCOUNTER — Ambulatory Visit (INDEPENDENT_AMBULATORY_CARE_PROVIDER_SITE_OTHER): Payer: 59 | Admitting: Family Medicine

## 2016-03-01 VITALS — BP 118/74 | Temp 98.2°F | Ht 66.0 in | Wt 175.0 lb

## 2016-03-01 DIAGNOSIS — R197 Diarrhea, unspecified: Secondary | ICD-10-CM | POA: Insufficient documentation

## 2016-03-01 DIAGNOSIS — A09 Infectious gastroenteritis and colitis, unspecified: Secondary | ICD-10-CM | POA: Diagnosis not present

## 2016-03-01 NOTE — Progress Notes (Signed)
Subjective:    Patient ID: Terry Bell, female    DOB: 1956-06-18, 60 y.o.   MRN: [id number]  HPI Here for diarrhea   Thinks she may have had food poisoning   Ribs Monday night  Leftovers- tues night-that night started having abd tenderness and diarrhea  Explosive diarrhea all day Wednesday   Had to work Thursday (at [employer])- but not in pt care  Had to call out yesterday   Had fever-it broke Thursday night 12 BM yesterday  Took immodium 2 am - does not help much  BM at least every 2 hours  Watery  No blood   No vomiting  Was nauseated No appetite Ate a few eggs this am- that went better than prevoiusly   Low abd pain crampy just before bm and then no pain after    Patient Active Problem List   Diagnosis Date Noted  . Diarrhea 03/01/2016  . 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 ACNE ROSACEA 01/01/2010  . 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 ALLERGIC RHINITIS DUE TO OTHER ALLERGEN 10/02/2009  . FATTY LIVER DISEASE 01/15/2009  . OSTEOARTHRITIS 12/16/2007  . OTHER MALAISE AND FATIGUE 12/16/2007  . OSTEOPENIA 09/10/2007  . DEPRESSION 07/07/2007  . VARICOSE VEIN, LWR EXTREMITIES W/INFLAMMATION 07/07/2007   Past Medical History  Diagnosis Date  . Depression   . Varicose veins   . Arthritis   . Diverticulosis   . Steatohepatitis   . Tinnitus   . Continuous leakage of urine    Past Surgical History  Procedure Laterality Date  . Torn cartledge in rt knee    . Colonsocopy    . Cholecystectomy    . Rt knee arthroscopy    . Bilateral bunionectomy    . Debridement and removal and lateral release of torn cartlidge in rt knee    . Arthroscopic left knee sugery    . Gum grating     Social History  Substance Use Topics  . Smoking status: Never Smoker   . Smokeless tobacco: None  . Alcohol Use: No   Family History  Problem Relation Age of Onset  . Diabetes Mother   . Liver disease Mother   . Diabetes Sister   . Liver disease Sister   .  Hypertension    . Cancer    . Hemochromatosis     Allergies  Allergen Reactions  . Niacin     REACTION: rash   Current Outpatient Prescriptions on File Prior to Visit  Medication Sig Dispense Refill  . calcium carbonate (OS-CAL) 600 MG TABS tablet Take 600 mg by mouth daily.    . Cholecalciferol (VITAMIN D) 1000 UNITS capsule Take 1,000 Units by mouth daily.      . mometasone (NASONEX) 50 MCG/ACT nasal spray Place 2 sprays into the nose as needed. 17 g 6  . Probiotic Product (ALIGN) 4 MG CAPS Take 4 mg by mouth daily.    . vitamin E 400 UNIT capsule Take 800 Units by mouth.      No current facility-administered medications on file prior to visit.    Review of Systems Review of Systems  Constitutional: Negative for fever, appetite change, fatigue and unexpected weight change.  Eyes: Negative for pain and visual disturbance.  Respiratory: Negative for cough and shortness of breath.   Cardiovascular: Negative for cp or palpitations    Gastrointestinal: Negative for dark stool/ blood in stool, vomiting  Genitourinary: Negative for urgency and frequency.  Skin: Negative for  pallor or rash   Neurological: Negative for weakness, light-headedness, numbness and headaches.  Hematological: Negative for adenopathy. Does not bruise/bleed easily.  Psychiatric/Behavioral: Negative for dysphoric mood. The patient is not nervous/anxious.         Objective:   Physical Exam  Constitutional: She appears well-developed and well-nourished. No distress.  HENT:  Head: Normocephalic and atraumatic.  Mouth/Throat: Oropharynx is clear and moist.  Eyes: Conjunctivae and EOM are normal. Pupils are equal, round, and reactive to light. No scleral icterus.  Neck: Normal range of motion. Neck supple.  Cardiovascular: Normal rate, regular rhythm and normal heart sounds.   Pulmonary/Chest: Effort normal and breath sounds normal. No respiratory distress. She has no wheezes. She has no rales.  Abdominal:  Soft. Bowel sounds are normal. She exhibits no distension and no mass. There is no hepatosplenomegaly. There is no tenderness. There is no rebound, no guarding, no tenderness at McBurney's point and negative Murphy's sign.  Lymphadenopathy:    She has no cervical adenopathy.  Neurological: She is alert.  Skin: Skin is warm and dry. No erythema. No pallor.  Brisk capillary refill time   Psychiatric: She has a normal mood and affect.          Assessment & Plan:   Problem List Items Addressed This Visit      Other   Diarrhea - Primary    Suspect infectious- either food poisoning or viral (in house) Reassuring exam Fortunately starting to get better this am  Disc symptomatic care - see instructions on AVS  Sips of fluids (disc s/s of dehydration) Rest  Skip immodium if it makes you cramp Update if not starting to improve in a week or if worsening

## 2016-03-01 NOTE — Assessment & Plan Note (Signed)
Suspect infectious- either food poisoning or viral (in house) Reassuring exam Fortunately starting to get better this am  Disc symptomatic care - see instructions on AVS  Sips of fluids (disc s/s of dehydration) Rest  Skip immodium if it makes you cramp Update if not starting to improve in a week or if worsening

## 2016-03-01 NOTE — Progress Notes (Signed)
Pre visit review using our clinic review tool, if applicable. No additional management support is needed unless otherwise documented below in the visit note. 

## 2016-03-01 NOTE — Patient Instructions (Signed)
Make sure to drink lots of fluids - and advance diet gradually (bananas/rice/applesauce/toast) Get some rest  immodium is ok if it does not give you cramps Update if no further improvement in 2-3 days  If fever or severe abdominal pain -go to the ED

## 2016-03-03 ENCOUNTER — Telehealth: Payer: Self-pay | Admitting: Family Medicine

## 2016-03-03 NOTE — Telephone Encounter (Signed)
[facility]  [facility] Night - Client [place] Patient Name: Terry Bell Gender: Female DOB: 08-Nov-1955 Age: 60 Y 37 M 8 D Return Phone Number: [phone] (Primary) Address: City/State/Zip: [place] Client [facility] Night - Client Client Site [place] - Night Physician Colin Benton - MD Contact Type Call Who Is Calling Patient / Member / Family / Caregiver Call Type Triage / Clinical Relationship To Patient Self Return Phone Number [phone] (Primary) Chief Complaint Abdominal Pain Reason for Call Symptomatic / Request for Health Information Initial Comment Caller states Terry Bell, had acute GI episode, began Tues; thought had improved but was up most of night w/abd pain and diarrhea; PreDisposition Go to Urgent Care/Walk-In Clinic Translation No Nurse Assessment Nurse: Malva Cogan, RN, Juliann Pulse Date/Time Eilene Ghazi Time): 03/01/2016 8:14:22 AM Confirm and document reason for call. If symptomatic, describe symptoms. You must click the next button to save text entered. ---Caller states that she had onset of abd pain that is improved with passage of stool, had onset of diarrhea on Tuesday but no vomiting. Did have a fever that broke on Thursday night. Has the patient traveled out of the country within the last 30 days? ---No Does the patient have any new or worsening symptoms? ---Yes Will a triage be completed? ---Yes Related visit to physician within the last 2 weeks? ---No Does the PT have any chronic conditions? (i.e. diabetes, asthma, etc.) ---Yes List chronic conditions. ---NASH Is this a behavioral health or substance abuse call? ---No Guidelines Guideline Title Affirmed Question Affirmed Notes Nurse Date/Time (Eastern Time) Diarrhea [1] SEVERE diarrhea (e.g., 7 or more times / day more than normal) AND [2] present > 24 hours (1 day) Malva Cogan,  RN, Juliann Pulse 03/01/2016 8:16:13 AM Disp. Time Eilene Ghazi Time) Disposition Final User 03/01/2016 8:19:42 AM See Physician within 24 Hours Yes Malva Cogan, RN, Juliann Pulse PLEASE NOTE: All timestamps contained within this report are represented as [place] Standard Time. CONFIDENTIALTY NOTICE: This fax transmission is intended only for the addressee. It contains information that is legally privileged, confidential or otherwise protected from use or disclosure. If you are not the intended recipient, you are strictly prohibited from reviewing, disclosing, copying using or disseminating any of this information or taking any action in reliance on or regarding this information. If you have received this fax in error, please notify us immediately by telephone so that we can arrange for its return to [place]. Phone: [phone], Toll-Free: [phone], Fax: [phone] Page: 2 of 2 Call Id: [id number] Caller Understands: Yes Disagree/Comply: Comply Care Advice Given Per Guideline SEE PHYSICIAN WITHIN 24 HOURS: * IF OFFICE WILL BE OPEN: You need to be seen within the next 24 hours. Call your doctor when the office opens, and make an appointment. CLEAR FLUIDS: * Drink more fluids. * Sip water or a half-strength sports drink (Gatorade or Powerade; mix half and half with water) * Other options: oral rehydration solution (Pedialyte or Rehydralyte) . CALL BACK IF: * Signs of dehydration occur (e.g., no urine over 12 hours, very dry mouth, lightheaded, etc.) * Bloody stools * Constant or severe abdominal pain * You become worse. CARE ADVICE given per Diarrhea (Adult) guideline. Comments User: Olena Mater, RN Date/Time (Eastern Time): 03/01/2016 8:17:22 AM 0 now but has been up to 9 on pain scale but not for > 1 hr Referrals [facility]

## 2016-04-07 ENCOUNTER — Ambulatory Visit (INDEPENDENT_AMBULATORY_CARE_PROVIDER_SITE_OTHER): Payer: 59 | Admitting: Family Medicine

## 2016-04-07 ENCOUNTER — Encounter: Payer: Self-pay | Admitting: Family Medicine

## 2016-04-07 VITALS — BP 118/88 | HR 71 | Temp 98.1°F | Ht 65.25 in | Wt 180.3 lb

## 2016-04-07 DIAGNOSIS — Z23 Encounter for immunization: Secondary | ICD-10-CM

## 2016-04-07 DIAGNOSIS — E663 Overweight: Secondary | ICD-10-CM | POA: Diagnosis not present

## 2016-04-07 DIAGNOSIS — Z0001 Encounter for general adult medical examination with abnormal findings: Secondary | ICD-10-CM

## 2016-04-07 DIAGNOSIS — Z1211 Encounter for screening for malignant neoplasm of colon: Secondary | ICD-10-CM

## 2016-04-07 DIAGNOSIS — M899 Disorder of bone, unspecified: Secondary | ICD-10-CM

## 2016-04-07 DIAGNOSIS — Z0000 Encounter for general adult medical examination without abnormal findings: Secondary | ICD-10-CM | POA: Diagnosis not present

## 2016-04-07 DIAGNOSIS — M949 Disorder of cartilage, unspecified: Secondary | ICD-10-CM

## 2016-04-07 DIAGNOSIS — R0683 Snoring: Secondary | ICD-10-CM | POA: Diagnosis not present

## 2016-04-07 LAB — LIPID PANEL
Cholesterol: 139 mg/dL (ref 0–200)
HDL: 62.9 mg/dL (ref >39.00)
LDL Cholesterol: 68 mg/dL (ref 0–99)
NonHDL: 76.48
Total CHOL/HDL Ratio: 2
Triglycerides: 44 mg/dL (ref 0.0–149.0)
VLDL: 8.8 mg/dL (ref 0.0–40.0)

## 2016-04-07 LAB — BASIC METABOLIC PANEL
BUN: 21 mg/dL (ref 6–23)
CO2: 25 mEq/L (ref 19–32)
Calcium: 9.7 mg/dL (ref 8.4–10.5)
Chloride: 105 mEq/L (ref 96–112)
Creatinine, Ser: 0.77 mg/dL (ref 0.40–1.20)
GFR: 81.33 mL/min (ref >60.00)
Glucose, Bld: 95 mg/dL (ref 70–99)
Potassium: 4.1 mEq/L (ref 3.5–5.1)
Sodium: 138 mEq/L (ref 135–145)

## 2016-04-07 LAB — HEMOGLOBIN A1C: Hgb A1c MFr Bld: 4.9 % (ref 4.6–6.5)

## 2016-04-07 NOTE — Patient Instructions (Addendum)
BEFORE YOU LEAVE: -tetanus booster -labs -follow up in 6 months  -We placed a referral for you as discussed for the colonoscopy in the fall and your bone density test. We also sent a referral to the the pulmonologist for your concerns regarding sleep apnea It usually takes about 1-2 weeks to process and schedule this referral. If you have not heard from [place] regarding this appointment in 2 weeks please contact our office.  We recommend the following healthy lifestyle measures: - eat a healthy whole foods diet consisting of regular small meals composed of vegetables, fruits, beans, nuts, seeds, healthy meats such as white chicken and fish and whole grains.  - avoid sweets, white starchy foods, fried foods, fast food, processed foods, sodas, red meet and other fattening foods.  - get a least 150-300 minutes of aerobic exercise per week.   We have ordered labs or studies at this visit. It can take up to 1-2 weeks for results and processing. IF results require follow up or explanation, we will call you with instructions. Clinically stable results will be released to your [facility]. If you have not heard from [place] or cannot find your results in [facility] in 2 weeks please contact our office at [phone].  If you are not yet signed up for [facility], please consider signing up.

## 2016-04-07 NOTE — Progress Notes (Signed)
HPI:  Here for CPE:  -Concerns and/or follow up today:   Overweight/snoring: -on belviq in the past -on paleo diet  -not exercising other then walking some  New complain, concern for OSA: -she is concerned for sleep apnea as snores a lot, husband thinks has apneic spells in sleep, no daytime somnelence -no restless sleep -children and husband have commented on her snoring   Allergic Rhinitis/Tinnitus: -uses INS seasonally -has had mild tinnitus chronically -doing well otherwise  NASH: -sees hepatologist for this as has a family hx of hepatic cancer - monitored with labs and [place] q 6 months and endoscopy (Dr. Mosetta Pigeon with [facility]) -diet and exercise: paleo -denies: abd pain, nausea, vomiting  Urinary urgency: -for a very long at least 5 years, mild urgency -tried vesicare with prior PCP which helped -occ mild leaking prior to getting to the bathroom -no recurrent UTI, leaking otherwise, sig incontinence  -Taking folic acid, vitamin D or calcium: takes vit d  -Diabetes and Dyslipidemia Screening:FASTING  -Hx of HTN: no  -Vaccines: UTD  -pap history: done in 2015, always normal; declined pelvic  -FDLMP: n/a  -sexual activity: yes, female partner, no new partners  -wants STI testing (Hep C if born [birth year]): no - done  -FH breast, colon or ovarian ca: see FH Last mammogram: does mammograms yearly Last colon cancer screening: she will be due in October and she wishes to do a colonoscopy after discussion of options  Breast Ca Risk Assessment: -no sig risks, no FH with breast or ovarian ca  -Alcohol, Tobacco, drug use: see social history  Review of Systems - no fevers, unintentional weight loss, vision loss, hearing loss, chest pain, sob, hemoptysis, melena, hematochezia, hematuria, genital discharge, changing or concerning skin lesions, bleeding, bruising, loc, thoughts of self harm or SI  Past Medical History  Diagnosis Date  . Depression   . Varicose  veins   . Arthritis   . Diverticulosis   . Steatohepatitis   . Tinnitus   . Continuous leakage of urine     Past Surgical History  Procedure Laterality Date  . Torn cartledge in rt knee    . Colonsocopy    . Cholecystectomy    . Rt knee arthroscopy    . Bilateral bunionectomy    . Debridement and removal and lateral release of torn cartlidge in rt knee    . Arthroscopic left knee sugery    . Gum grating      Family History  Problem Relation Age of Onset  . Diabetes Mother   . Liver disease Mother   . Diabetes Sister   . Liver disease Sister   . Hypertension    . Cancer    . Hemochromatosis      Social History   Social History  . Marital Status: Married    Spouse Name: N/A  . Number of Children: N/A  . Years of Education: N/A   Social History Main Topics  . Smoking status: Never Smoker   . Smokeless tobacco: None  . Alcohol Use: No  . Drug Use: No  . Sexual Activity: Not Asked   Other Topics Concern  . 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  calcium carbonate (OS-CAL) 600 MG TABS tablet, Take 600 mg by mouth daily., Disp: , Rfl:  .  Cholecalciferol (VITAMIN D) 1000 UNITS capsule,  Take 1,000 Units by mouth daily.  , Disp: , Rfl:  .  mometasone (NASONEX) 50 MCG/ACT nasal spray, Place 2 sprays into the nose as needed., Disp: 17 g, Rfl: 6 .  Probiotic Product (ALIGN) 4 MG CAPS, Take 4 mg by mouth daily., Disp: , Rfl:  .  vitamin E 400 UNIT capsule, Take 800 Units by mouth. , Disp: , Rfl:   EXAM:  Filed Vitals:   04/07/16 0838  BP: 118/88  Pulse: 71  Temp: 98.1 F (36.7 C)   Body mass index is 29.79 kg/(m^2).  GENERAL: vitals reviewed and listed below, alert, oriented, appears well hydrated and in no acute distress  HEENT: head atraumatic, PERRLA, normal appearance of eyes, ears, nose and mouth. moist mucus membranes.  NECK: supple,  no masses or lymphadenopathy  LUNGS: clear to auscultation bilaterally, no rales, rhonchi or wheeze  CV: HRRR, no peripheral edema or cyanosis, normal pedal pulses  BREAST: declined  ABDOMEN: bowel sounds normal, soft, non tender to palpation, no masses, no rebound or guarding  GU: declined  SKIN: no rash or abnormal lesions; declined full skin exam as is seeing dermatologist for this  MS: normal gait, moves all extremities normally  NEURO: CN II-XII grossly intact, normal muscle strength and sensation to light touch on extremities  PSYCH: normal affect, pleasant and cooperative  ASSESSMENT AND PLAN:  Discussed the following assessment and plan:  Snoring - Plan: Ambulatory referral to Pulmonology  Overweight - Plan: Ambulatory referral to Pulmonology  Visit for preventive health examination - Plan: Lipid Panel, Hemoglobin [id number], Basic metabolic panel  Colon cancer screening - Plan: Ambulatory referral to Gastroenterology  Disorder of bone and cartilage - Plan: DG Bone Density  -Discussed and advised all [place] preventive services health task force level A and B recommendations for age, sex and risks.  -Advised at least 150 minutes of exercise per week and a healthy diet low in saturated fats and sweets and consisting of fresh fruits and vegetables, lean meats such as fish and white chicken and whole grains.  -FASTING labs, studies and vaccines per orders this encounter  Orders Placed This Encounter  Procedures  . DG Bone Density    Standing Status: Future     Number of Occurrences:      Standing Expiration Date: 06/07/2017    Order Specific Question:  Reason for Exam (SYMPTOM  OR DIAGNOSIS REQUIRED)    Answer:  osteopenia, estrogen def    Order Specific Question:  Is the patient pregnant?    Answer:  No    Order Specific Question:  Preferred imaging location?    Answer:  [facility]  . Lipid Panel  . Hemoglobin A1C  . Basic metabolic panel  . Ambulatory referral  to Pulmonology    Referral Priority:  Routine    Referral Type:  Consultation    Referral Reason:  Specialty Services Required    Requested Specialty:  Pulmonary Disease    Number of Visits Requested:  1  . Ambulatory referral to Gastroenterology    Referral Priority:  Routine    Referral Type:  Consultation    Referral Reason:  Specialty Services Required    Number of Visits Requested:  1    Patient advised to return to clinic immediately if symptoms worsen or persist or new concerns.  Patient Instructions  BEFORE YOU LEAVE: -tetanus booster -labs -follow up in 6 months  -We placed a referral for you as discussed for the colonoscopy in the fall and  your bone density test. We also sent a referral to the the pulmonologist for your concerns regarding sleep apnea It usually takes about 1-2 weeks to process and schedule this referral. If you have not heard from [place] regarding this appointment in 2 weeks please contact our office.  We recommend the following healthy lifestyle measures: - eat a healthy whole foods diet consisting of regular small meals composed of vegetables, fruits, beans, nuts, seeds, healthy meats such as white chicken and fish and whole grains.  - avoid sweets, white starchy foods, fried foods, fast food, processed foods, sodas, red meet and other fattening foods.  - get a least 150-300 minutes of aerobic exercise per week.   We have ordered labs or studies at this visit. It can take up to 1-2 weeks for results and processing. IF results require follow up or explanation, we will call you with instructions. Clinically stable results will be released to your [facility]. If you have not heard from [place] or cannot find your results in [facility] in 2 weeks please contact our office at [phone].  If you are not yet signed up for [facility], please consider signing up.            No Follow-up on file.  Colin Benton R.

## 2016-04-07 NOTE — Progress Notes (Signed)
Pre visit review using our clinic review tool, if applicable. No additional management support is needed unless otherwise documented below in the visit note. 

## 2016-04-07 NOTE — Addendum Note (Signed)
Addended by: Agnes Lawrence on: 04/07/2016 09:40 AM   Modules accepted: Orders

## 2016-04-17 ENCOUNTER — Telehealth: Payer: Self-pay | Admitting: Family Medicine

## 2016-04-17 ENCOUNTER — Other Ambulatory Visit: Payer: Self-pay | Admitting: Family Medicine

## 2016-04-17 DIAGNOSIS — Z1231 Encounter for screening mammogram for malignant neoplasm of breast: Secondary | ICD-10-CM

## 2016-04-17 DIAGNOSIS — E2839 Other primary ovarian failure: Secondary | ICD-10-CM

## 2016-04-17 NOTE — Telephone Encounter (Signed)
 imaging needs the bone density dx code changed to Estrogen deficiency

## 2016-04-21 NOTE — Telephone Encounter (Signed)
I called [place] and per [place] a new order needs to be entered for the bone density.  New order entered with diagnosis listed below.

## 2016-04-21 NOTE — Telephone Encounter (Signed)
Ok to change

## 2016-05-14 ENCOUNTER — Ambulatory Visit
Admission: RE | Admit: 2016-05-14 | Discharge: 2016-05-14 | Disposition: A | Discharge status: Home / Self Care | Payer: 59 | Source: Ambulatory Visit | Attending: Family Medicine | Admitting: Family Medicine

## 2016-05-14 DIAGNOSIS — Z1231 Encounter for screening mammogram for malignant neoplasm of breast: Secondary | ICD-10-CM

## 2016-05-15 ENCOUNTER — Other Ambulatory Visit: Payer: Self-pay | Admitting: Family Medicine

## 2016-05-15 DIAGNOSIS — R928 Other abnormal and inconclusive findings on diagnostic imaging of breast: Secondary | ICD-10-CM

## 2016-05-21 ENCOUNTER — Ambulatory Visit
Admission: RE | Admit: 2016-05-21 | Discharge: 2016-05-21 | Disposition: A | Discharge status: Home / Self Care | Payer: 59 | Source: Ambulatory Visit | Attending: Family Medicine | Admitting: Family Medicine

## 2016-05-21 DIAGNOSIS — N63 Unspecified lump in breast: Secondary | ICD-10-CM | POA: Diagnosis not present

## 2016-05-21 DIAGNOSIS — R928 Other abnormal and inconclusive findings on diagnostic imaging of breast: Secondary | ICD-10-CM

## 2016-05-28 ENCOUNTER — Ambulatory Visit
Admission: RE | Admit: 2016-05-28 | Discharge: 2016-05-28 | Disposition: A | Discharge status: Home / Self Care | Payer: 59 | Source: Ambulatory Visit | Attending: Family Medicine | Admitting: Family Medicine

## 2016-05-28 DIAGNOSIS — Z780 Asymptomatic menopausal state: Secondary | ICD-10-CM | POA: Diagnosis not present

## 2016-05-28 DIAGNOSIS — E2839 Other primary ovarian failure: Secondary | ICD-10-CM

## 2016-05-28 DIAGNOSIS — M8588 Other specified disorders of bone density and structure, other site: Secondary | ICD-10-CM | POA: Diagnosis not present

## 2016-06-16 DIAGNOSIS — K760 Fatty (change of) liver, not elsewhere classified: Secondary | ICD-10-CM | POA: Diagnosis not present

## 2016-06-16 DIAGNOSIS — Z9049 Acquired absence of other specified parts of digestive tract: Secondary | ICD-10-CM | POA: Diagnosis not present

## 2016-06-16 DIAGNOSIS — R160 Hepatomegaly, not elsewhere classified: Secondary | ICD-10-CM | POA: Diagnosis not present

## 2016-06-24 ENCOUNTER — Ambulatory Visit (INDEPENDENT_AMBULATORY_CARE_PROVIDER_SITE_OTHER): Payer: 59 | Admitting: Family Medicine

## 2016-06-24 ENCOUNTER — Encounter: Payer: Self-pay | Admitting: Family Medicine

## 2016-06-24 VITALS — BP 120/80 | HR 73 | Temp 99.0°F | Ht 65.25 in | Wt 181.3 lb

## 2016-06-24 DIAGNOSIS — M25511 Pain in right shoulder: Secondary | ICD-10-CM | POA: Diagnosis not present

## 2016-06-24 MED ORDER — CYCLOBENZAPRINE HCL 5 MG PO TABS: 5.0000 mg | ORAL_TABLET | Freq: Three times a day (TID) | ORAL | 1 refills | Status: DC | PRN

## 2016-06-24 MED FILL — CYCLOBENZAPRINE 5 MG TABLET: 5 | 10 days supply | Qty: 30 | Fill #0

## 2016-06-24 NOTE — Patient Instructions (Signed)
BEFORE YOU LEAVE: -follow up: 1 month -home exercises  Do the exercises 4 days per week.  Heat 15 minutes twice daily.  Aleve and/or topical sports creams (with capsaicin or menthol) as needed for pain.  Follow up sooner if you feel is worsening or need to miss more work and we will place a referral to the sports medicine or orthopedic specialist.

## 2016-06-24 NOTE — Progress Notes (Signed)
Pre visit review using our clinic review tool, if applicable. No additional management support is needed unless otherwise documented below in the visit note. 

## 2016-06-24 NOTE — Progress Notes (Signed)
HPI:  R shoulder pain: -started 3 weeks ago but worse after lifting and playing with 60 yo a few days ago -sharp pain in ant R shoulder - worse with certain activities/better with ibuprofen and flexeril -denies: radiation of pain, weakness, numbness, fevers, malaise  ROS: See pertinent positives and negatives per HPI.  Past Medical History:  Diagnosis Date  . Arthritis   . Continuous leakage of urine   . Depression   . Diverticulosis   . Steatohepatitis   . Tinnitus   . Varicose veins     Past Surgical History:  Procedure Laterality Date  . arthroscopic left knee sugery    . bilateral bunionectomy    . CHOLECYSTECTOMY    . colonsocopy    . debridement and removal and lateral release of torn cartlidge in rt knee    . gum grating    . rt knee arthroscopy    . torn cartledge in rt knee      Family History  Problem Relation Age of Onset  . Diabetes Mother   . Liver disease Mother   . Diabetes Sister   . Liver disease Sister   . Hypertension    . Cancer    . Hemochromatosis      Social History   Social History  . Marital status: Married    Spouse name: N/A  . Number of children: N/A  . Years of education: N/A   Social History Main Topics  . Smoking status: Never Smoker  . Smokeless tobacco: None  . Alcohol use No  . Drug use: No  . Sexual activity: Not Asked   Other Topics Concern  . 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  calcium carbonate (OS-CAL) 600 MG TABS tablet, Take 600 mg by mouth 2 (two) times daily with a meal. , Disp: , Rfl:  .  Cholecalciferol (VITAMIN D) 1000 UNITS capsule, Take 1,000 Units by mouth daily.  , Disp: , Rfl:  .  mometasone (NASONEX) 50 MCG/ACT nasal spray, Place 2 sprays into the nose as needed., Disp: 17 g, Rfl: 6 .  Probiotic Product (ALIGN) 4 MG CAPS, Take 4 mg by mouth daily., Disp: , Rfl:  .  vitamin E  400 UNIT capsule, Take 800 Units by mouth. , Disp: , Rfl:  .  cyclobenzaprine (FLEXERIL) 5 MG tablet, Take 1 tablet (5 mg total) by mouth 3 (three) times daily as needed for muscle spasms., Disp: 30 tablet, Rfl: 1  EXAM:  Vitals:   06/24/16 1505  BP: 120/80  Pulse: 73  Temp: 99 F (37.2 C)    Body mass index is 29.94 kg/m.  GENERAL: vitals reviewed and listed above, alert, oriented, appears well hydrated and in no acute distress  HEENT: atraumatic, conjunttiva clear, no obvious abnormalities on inspection of external nose and ears  NECK: no obvious masses on inspection  MS: moves all extremities without noticeable abnormality;  Normal inspection of the shoulders , upper extremities and neck. She actually is quite fit and has good muscle tone throughout. Tenderness to palpation in the anterior fibers of the deltoid muscle. No weakness in any movements of the shoulder or upper extremities. Normal range of motion in the shoulders. NV  Intact distally. Negative impingement test, negative Neer's.  PSYCH: pleasant and cooperative, no obvious depression or anxiety  ASSESSMENT AND PLAN:  Discussed  the following assessment and plan:  Pain in joint of right shoulder  - suspect minor muscle strain/sprain - opted to treat conservatively with heat, home exercises and topical sports creams and Aleve as needed for pain. - Follow up in 1 month or sooner as needed - She works for [employer] as a Marine scientist and if she needs to be out of work cannot have any restrictions and needs to be completely out ( she prefers not to do this) -  Would have her see ortho if this is needed  to confirm diagnosis and rule out other and for management -Patient advised to return or notify a doctor immediately if symptoms worsen or persist or new concerns arise.  There are no Patient Instructions on file for this visit.  Colin Benton R., DO

## 2016-06-26 ENCOUNTER — Encounter: Payer: Self-pay | Admitting: Gastroenterology

## 2016-07-09 ENCOUNTER — Ambulatory Visit (INDEPENDENT_AMBULATORY_CARE_PROVIDER_SITE_OTHER): Payer: 59 | Admitting: Pulmonary Disease

## 2016-07-09 ENCOUNTER — Encounter: Payer: Self-pay | Admitting: Pulmonary Disease

## 2016-07-09 VITALS — BP 118/74 | HR 68 | Ht 66.0 in | Wt 184.2 lb

## 2016-07-09 DIAGNOSIS — R0683 Snoring: Secondary | ICD-10-CM | POA: Diagnosis not present

## 2016-07-09 NOTE — Patient Instructions (Signed)
Will arrange for home sleep study Will call to arrange for follow up after sleep study reviewed  

## 2016-07-09 NOTE — Progress Notes (Signed)
Past Surgical History She  has a past surgical history that includes torn cartledge in rt knee; colonsocopy; Cholecystectomy; rt knee arthroscopy; bilateral bunionectomy; debridement and removal and lateral release of torn cartlidge in rt knee; arthroscopic left knee sugery; and gum grating.  Allergies  Allergen Reactions  . Niacin     REACTION: rash    Family History Her family history includes Diabetes in her mother and sister; Liver disease in her mother and sister.  Social History She  reports that she has never smoked. She has never used smokeless tobacco. She reports that she does not drink alcohol or use drugs.  Review of systems Constitutional: Negative for fever and unexpected weight change.  HENT: Positive for congestion. Negative for dental problem, ear pain, nosebleeds, postnasal drip, rhinorrhea, sinus pressure, sneezing, sore throat and trouble swallowing.   Eyes: Negative for redness and itching.  Respiratory: Negative for cough, chest tightness, shortness of breath and wheezing.   Cardiovascular: Negative for palpitations and leg swelling.  Gastrointestinal: Negative for nausea and vomiting.  Genitourinary: Negative for dysuria.  Musculoskeletal: Negative for joint swelling.  Skin: Negative for rash.  Neurological: Negative for headaches.  Hematological: Does not bruise/bleed easily.  Psychiatric/Behavioral: Negative for dysphoric mood. The patient is not nervous/anxious.     Current Outpatient Prescriptions on File Prior to Visit  Medication Sig  . calcium carbonate (OS-CAL) 600 MG TABS tablet Take 600 mg by mouth 2 (two) times daily with a meal.   . Cholecalciferol (VITAMIN D) 1000 UNITS capsule Take 1,000 Units by mouth 2 (two) times daily.   . Probiotic Product (ALIGN) 4 MG CAPS Take 4 mg by mouth daily.  . vitamin E 400 UNIT capsule Take 800 Units by mouth.   . mometasone (NASONEX) 50 MCG/ACT nasal spray Place 2 sprays into the nose as needed. (Patient not  taking: Reported on 07/09/2016)   No current facility-administered medications on file prior to visit.     Chief Complaint  Patient presents with  . Sleep Consult    Referred by Dr Maudie Mercury. Epworth Score: 13    Tests:  Past medical history She  has a past medical history of Arthritis; Continuous leakage of urine; Depression; Diverticulosis; Steatohepatitis; Tinnitus; and Varicose veins.  Vital signs BP 118/74 (BP Location: Right Arm, Cuff Size: Normal)   Pulse 68   Ht 5' 6"  (1.676 m)   Wt 184 lb 3.2 oz (83.6 kg)   SpO2 99%   BMI 29.73 kg/m   History of Present Illness Terry Bell is a 60 y.o. female for evaluation of sleep problems.  Her family has been concerned about her snoring.  Her husband says she stops breathing while asleep.  She will wake herself up sometimes hearing herself snore.  These have been getting more frequent.  She does notice feeling more tired during the day.  She was seen by PCP recently and advised to have further sleep assessment.  She goes to sleep at 10 pm.  She falls asleep after 10 minutes.  She wakes up some times to use the bathroom.  She gets out of bed at 6 am.  She feels tired in the morning.  She denies morning headache.  She does not use anything to help her fall sleep.  She drinks a cup of coffee in the morning to get going, and another cup in the afternoon to help stay awake.  Will will talk in her sleep.  Her mouth gets dry at night.  She will  occasionally get leg cramps.  She has trouble sleeping on her back.  She denies sleep walking, bruxism, or nightmares.  There is no history of restless legs.  She denies sleep hallucinations, sleep paralysis, or cataplexy.  The Epworth score is 13 out of 24.   Physical Exam:  General - No distress ENT - No sinus tenderness, no oral exudate, no LAN, no thyromegaly, TM clear, pupils equal/reactive, MP 4, scalloped tongue, narrow mandible, slight over bite Cardiac - s1s2 regular, no murmur, pulses  symmetric Chest - No wheeze/rales/dullness, good air entry, normal respiratory excursion Back - No focal tenderness Abd - Soft, non-tender, no organomegaly, + bowel sounds Ext - No edema Neuro - Normal strength, cranial nerves intact Skin - No rashes Psych - Normal mood, and behavior  Discussion: She has snoring, sleep disruption, witnessed apnea and daytime sleepiness.  I am concerned she could have sleep apnea.  We discussed how sleep apnea can affect various health problems, including risks for hypertension, cardiovascular disease, and diabetes.  We also discussed how sleep disruption can increase risks for accidents, such as while driving.  Weight loss as a means of improving sleep apnea was also reviewed.  Additional treatment options discussed were CPAP therapy, oral appliance, and surgical intervention.  Assessment/plan:  Snoring with concern for obstructive sleep apnea. - will arrange for home sleep study - she might be a good candidate for oral appliance if she is found to have sleep apnea   Patient Instructions  Will arrange for home sleep study Will call to arrange for follow up after sleep study reviewed     Chesley Mires, M.D. Pager [phone] 07/09/2016, 3:21 PM

## 2016-07-09 NOTE — Progress Notes (Signed)
   Subjective:    Patient ID: Terry Bell, female    DOB: 03/26/1956, 60 y.o.   MRN: [id number]  HPI    Review of Systems  Constitutional: Negative for fever and unexpected weight change.  HENT: Positive for congestion. Negative for dental problem, ear pain, nosebleeds, postnasal drip, rhinorrhea, sinus pressure, sneezing, sore throat and trouble swallowing.   Eyes: Negative for redness and itching.  Respiratory: Negative for cough, chest tightness, shortness of breath and wheezing.   Cardiovascular: Negative for palpitations and leg swelling.  Gastrointestinal: Negative for nausea and vomiting.  Genitourinary: Negative for dysuria.  Musculoskeletal: Negative for joint swelling.  Skin: Negative for rash.  Neurological: Negative for headaches.  Hematological: Does not bruise/bleed easily.  Psychiatric/Behavioral: Negative for dysphoric mood. The patient is not nervous/anxious.        Objective:   Physical Exam        Assessment & Plan:

## 2016-07-18 ENCOUNTER — Ambulatory Visit (INDEPENDENT_AMBULATORY_CARE_PROVIDER_SITE_OTHER): Payer: 59 | Admitting: Physician Assistant

## 2016-07-18 VITALS — BP 130/90 | HR 76 | Temp 98.5°F | Resp 16 | Ht 65.5 in | Wt 182.2 lb

## 2016-07-18 DIAGNOSIS — J029 Acute pharyngitis, unspecified: Secondary | ICD-10-CM | POA: Diagnosis not present

## 2016-07-18 DIAGNOSIS — K7581 Nonalcoholic steatohepatitis (NASH): Secondary | ICD-10-CM | POA: Insufficient documentation

## 2016-07-18 LAB — POCT CBC
Granulocyte percent: 61.2 %G (ref 37–80)
HCT, POC: 43.5 % (ref 37.7–47.9)
Hemoglobin: 15.7 g/dL (ref 12.2–16.2)
Lymph, poc: 1.7 (ref 0.6–3.4)
MCH, POC: 32.1 pg — AB (ref 27–31.2)
MCHC: 36.1 g/dL — AB (ref 31.8–35.4)
MCV: 89 fL (ref 80–97)
MID (cbc): 0.3 (ref 0–0.9)
MPV: 8.5 fL (ref 0–99.8)
POC Granulocyte: 3.2 (ref 2–6.9)
POC LYMPH PERCENT: 32.6 %L (ref 10–50)
POC MID %: 6.2 %M (ref 0–12)
Platelet Count, POC: 127 10*3/uL — AB (ref 142–424)
RBC: 4.89 M/uL (ref 4.04–5.48)
RDW, POC: 12.3 %
WBC: 5.2 10*3/uL (ref 4.6–10.2)

## 2016-07-18 LAB — POCT RAPID STREP A (OFFICE): Rapid Strep A Screen: NEGATIVE

## 2016-07-18 MED ORDER — AMBULATORY NON FORMULARY MEDICATION: 1 refills | Status: DC

## 2016-07-18 NOTE — Patient Instructions (Addendum)
Get plenty of rest and drink at least 64 ounces of water daily.  Enjoy your trip! Take lots of photos!    IF you received an x-ray today, you will receive an invoice from [facility]. Please contact [facility] at [phone] with questions or concerns regarding your invoice.   IF you received labwork today, you will receive an invoice from [employer]. Please contact [facility] at [phone] with questions or concerns regarding your invoice.   Our billing staff will not be able to assist you with questions regarding bills from these companies.  You will be contacted with the lab results as soon as they are available. The fastest way to get your results is to activate your My Chart account. Instructions are located on the last page of this paperwork. If you have not heard from [place] regarding the results in 2 weeks, please contact this office.

## 2016-07-18 NOTE — Progress Notes (Signed)
Patient ID: Terry Bell, female     DOB: October 04, 1956, 60 y.o.    MRN: [id number]  PCP: [name]., DO  Chief Complaint  Patient presents with  . Sore Throat    1 1/2 wks, sinus drainage, pt will receive flu shot at work    Subjective:    HPI  Presents for evaluation of sore throat x 10 days.  Symptoms developed gradually.  Pain in the throat is sharp and improved with drinking warm liquids.  Subjective fever, non-productive cough, nasal congestion and post-nasal drainage are present. No muscle or joint aches. No nausea, diarrhea, rash.  She will be leaving on vacation in a few days and wants to make sure she is ok.   Prior to Admission medications   Medication Sig Start Date End Date Taking? Authorizing Provider  calcium carbonate (OS-CAL) 600 MG TABS tablet Take 600 mg by mouth 2 (two) times daily with a meal.    Yes Historical Provider, MD  Cholecalciferol (VITAMIN D) 1000 UNITS capsule Take 1,000 Units by mouth 2 (two) times daily.    Yes Historical Provider, MD  mometasone (NASONEX) 50 MCG/ACT nasal spray Place 2 sprays into the nose as needed. 10/03/14  Yes [name], DO  Probiotic Product (ALIGN) 4 MG CAPS Take 4 mg by mouth daily.   Yes Historical Provider, MD  vitamin E 400 UNIT capsule Take 800 Units by mouth.    Yes Historical Provider, MD     Allergies  Allergen Reactions  . Niacin     REACTION: rash     Patient Active Problem List   Diagnosis Date Noted  . NASH (nonalcoholic steatohepatitis) 07/18/2016  . 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 ACNE ROSACEA 01/01/2010  . 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 OSTEOARTHRITIS 12/16/2007  . Disorder of bone and cartilage 09/10/2007  . DEPRESSION 07/07/2007  . VARICOSE VEIN, LWR EXTREMITIES W/INFLAMMATION 07/07/2007     Family History  Problem Relation Age of Onset  . Diabetes Mother   . Liver disease Mother   . Diabetes Sister   . Liver disease Sister   . Hypertension    .  Cancer    . Hemochromatosis    . Cancer Father     Throat     Social History   Social History  . Marital status: Married    Spouse name: N/A  . Number of children: N/A  . 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 Smoking status: Never Smoker  . Smokeless tobacco: Never Used  . Alcohol use No  . Drug use: No  . Sexual activity: Not on file   Other Topics Concern  . 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 Negative for appetite change, chills and unexpected weight change.  HENT: Positive for congestion, rhinorrhea, sneezing, sore throat, trouble swallowing and voice change (More raspy ). Negative for ear discharge, ear pain and sinus pressure.   Eyes: Negative for discharge and visual disturbance.  Respiratory: Negative for chest tightness and shortness of breath.   Cardiovascular: Negative for chest pain, palpitations and leg swelling.  Gastrointestinal: Negative for abdominal pain, diarrhea, nausea and vomiting.  Genitourinary: Negative for dysuria,  frequency and urgency.  Musculoskeletal: Negative for arthralgias and myalgias.  Skin: Negative for pallor and rash.  Neurological: Negative for dizziness, syncope, weakness and headaches.  Psychiatric/Behavioral: Negative for agitation and behavioral problems.       Objective:  Physical Exam  Constitutional: She is oriented to person, place, and time. She appears well-developed and well-nourished. She is active and cooperative. No distress.  BP 130/90   Pulse 76   Temp 98.5 F (36.9 C) (Oral)   Resp 16   Ht 5' 5.5" (1.664 m)   Wt 182 lb 3.2 oz (82.6 kg)   SpO2 98%   BMI 29.86 kg/m   HENT:  Head: Normocephalic and atraumatic.  Right Ear: Hearing, tympanic membrane, external ear and  ear canal normal.  Left Ear: Hearing, tympanic membrane, external ear and ear canal normal.  Nose: Nose normal.  Mouth/Throat: Uvula is midline, oropharynx is clear and moist and mucous membranes are normal. No oral lesions. No uvula swelling.  Eyes: Conjunctivae are normal. No scleral icterus.  Neck: Normal range of motion. Neck supple. No thyromegaly present.  Cardiovascular: Normal rate, regular rhythm and normal heart sounds.   Pulses:      Radial pulses are 2+ on the right side, and 2+ on the left side.  Pulmonary/Chest: Effort normal and breath sounds normal.  Lymphadenopathy:       Head (right side): No tonsillar, no preauricular, no posterior auricular and no occipital adenopathy present.       Head (left side): No tonsillar, no preauricular, no posterior auricular and no occipital adenopathy present.    She has no cervical adenopathy.       Right: No supraclavicular adenopathy present.       Left: No supraclavicular adenopathy present.  Neurological: She is alert and oriented to person, place, and time. No sensory deficit.  Skin: Skin is warm, dry and intact. No rash noted. No cyanosis or erythema. Nails show no clubbing.  Psychiatric: She has a normal mood and affect. Her speech is normal and behavior is normal.        Results for orders placed or performed in visit on 07/18/16  POCT rapid strep A  Result Value Ref Range   Rapid Strep A Screen Negative Negative  POCT CBC  Result Value Ref Range   WBC 5.2 4.6 - 10.2 K/uL   Lymph, poc 1.7 0.6 - 3.4   POC LYMPH PERCENT 32.6 10 - 50 %L   MID (cbc) 0.3 0 - 0.9   POC MID % 6.2 0 - 12 %M   POC Granulocyte 3.2 2 - 6.9   Granulocyte percent 61.2 37 - 80 %G   RBC 4.89 4.04 - 5.48 M/uL   Hemoglobin 15.7 12.2 - 16.2 g/dL   HCT, POC 43.5 37.7 - 47.9 %   MCV 89.0 80 - 97 fL   MCH, POC 32.1 (A) 27 - 31.2 pg   MCHC 36.1 (A) 31.8 - 35.4 g/dL   RDW, POC 12.3 %   Platelet Count, POC 127 (A) 142 - 424 K/uL   MPV 8.5 0 - 99.8 fL         Assessment & Plan:  1. Sore throat Likely viral. Supportive care. Anticipatory guidance. Await TCx. - Culture, Group A Strep - POCT rapid strep A - POCT CBC - AMBULATORY NON FORMULARY MEDICATION; MIX: 90 ml Cherry Mylanta, 90 ml Cherry Benadryl, 30 ml Cherry Hurricane liquid. 5-10 ml PO swish and spit or swallow, Q2 hours, prn sore  throat.  Dispense: 210 mL; Refill: 1   Fara Chute, PA-C Physician Assistant-Certified [facility]

## 2016-07-18 NOTE — Progress Notes (Signed)
Subjective:    Patient ID: Terry Bell, female    DOB: 08-13-1956, 60 y.o.   MRN: [id number] Chief Complaint  Patient presents with  . Sore Throat    1 1/2 wks, sinus drainage, pt will receive flu shot at work    HPI Patient presents today with sore throat x 10 days. She reports gradual onset of her symptoms which have now been steady for over a week. She reports her throat pain is sharp, scratchy and is relieved by warm liquids. Patient is going on vacation in a few days and would like to know if there is anything she can do for her illness before her departure.  She reports assossiated symptoms of fever, dry cough, PND, difficulty swallowing, and nasal congestion. No symptoms of chills, diaphoresis, chest congestion, or myalgias.   Past Medical History:  Diagnosis Date  . Arthritis   . Continuous leakage of urine   . Depression   . Diverticulosis   . Steatohepatitis   . Tinnitus   . Varicose veins     Review of Systems  Constitutional: Positive for diaphoresis (Ocational) and fatigue. Negative for appetite change, chills and unexpected weight change.  HENT: Positive for congestion, rhinorrhea, sneezing, sore throat, trouble swallowing and voice change (More raspy ). Negative for ear discharge, ear pain and sinus pressure.   Eyes: Negative for discharge and visual disturbance.  Respiratory: Negative for chest tightness and shortness of breath.   Cardiovascular: Negative for chest pain, palpitations and leg swelling.  Gastrointestinal: Negative for abdominal pain, diarrhea, nausea and vomiting.  Genitourinary: Negative for dysuria, frequency and urgency.  Musculoskeletal: Negative for arthralgias and myalgias.  Skin: Negative for pallor and rash.  Neurological: Negative for dizziness, syncope, weakness and headaches.  Psychiatric/Behavioral: Negative for agitation and behavioral problems.       Objective:   Physical Exam  Constitutional: She appears well-developed and  well-nourished. No distress.  HENT:  Head: Normocephalic and atraumatic.  Right Ear: External ear normal.  Left Ear: External ear normal.  Mouth/Throat: Oropharynx is clear and moist. No oropharyngeal exudate.  Slight erythema in posterior pharynx  Eyes: Conjunctivae are normal. Pupils are equal, round, and reactive to light. Right eye exhibits no discharge. Left eye exhibits no discharge.  Neck: Neck supple. No thyromegaly present.  Cardiovascular: Normal rate, regular rhythm, normal heart sounds and intact distal pulses.  Exam reveals no gallop and no friction rub.   No murmur heard. Pulmonary/Chest: Effort normal and breath sounds normal. No respiratory distress. She exhibits no tenderness.  Abdominal: Soft. Bowel sounds are normal. She exhibits no distension. There is no tenderness.  Lymphadenopathy:    She has no cervical adenopathy.  Neurological: She is alert.  Skin: Skin is warm. She is not diaphoretic.  Slightly wet skin  Psychiatric: She has a normal mood and affect. Her behavior is normal.   BP 130/90   Pulse 76   Temp 98.5 F (36.9 C) (Oral)   Resp 16   Ht 5' 5.5" (1.664 m)   Wt 182 lb 3.2 oz (82.6 kg)   SpO2 98%   BMI 29.86 kg/m       Assessment & Plan:  1. Sore throat Rapid strep was negative; will send culture; treat symptomatically - Culture, Group A Strep - POCT rapid strep A - POCT CBC - AMBULATORY NON FORMULARY MEDICATION; MIX: 90 ml Cherry Mylanta, 90 ml Cherry Benadryl, 30 ml Cherry Hurricane liquid.  5-10 ml PO swish and spit or swallow,  Q2 hours, prn sore throat.  Dispense: 210 mL; Refill: 1

## 2016-07-19 DIAGNOSIS — G4733 Obstructive sleep apnea (adult) (pediatric): Secondary | ICD-10-CM | POA: Diagnosis not present

## 2016-07-20 LAB — CULTURE, GROUP A STREP: Organism ID, Bacteria: NORMAL

## 2016-07-22 ENCOUNTER — Encounter: Payer: Self-pay | Admitting: Pulmonary Disease

## 2016-07-22 ENCOUNTER — Ambulatory Visit: Payer: 59 | Admitting: Family Medicine

## 2016-07-22 ENCOUNTER — Telehealth: Payer: Self-pay | Admitting: Pulmonary Disease

## 2016-07-22 DIAGNOSIS — G4733 Obstructive sleep apnea (adult) (pediatric): Secondary | ICD-10-CM | POA: Insufficient documentation

## 2016-07-22 HISTORY — DX: Obstructive sleep apnea (adult) (pediatric): G47.33

## 2016-07-22 NOTE — Telephone Encounter (Signed)
HST 07/19/16 >> AHI 23, SaO2 low 79%.   Will have my nurse inform pt that sleep study shows moderate obstructive sleep apnea.   Please schedule ROV with me to review tests results and treatment options >> okay to double book visit.

## 2016-07-23 ENCOUNTER — Other Ambulatory Visit: Payer: Self-pay | Admitting: *Deleted

## 2016-07-23 DIAGNOSIS — R0683 Snoring: Secondary | ICD-10-CM

## 2016-07-23 NOTE — Telephone Encounter (Signed)
Results have been explained to patient, pt expressed understanding.  Patient scheduled with VS on 09/03/16 at 2:15 Nothing further needed.

## 2016-08-21 ENCOUNTER — Ambulatory Visit (AMBULATORY_SURGERY_CENTER): Payer: Self-pay

## 2016-08-21 VITALS — Ht 65.0 in | Wt 180.8 lb

## 2016-08-21 DIAGNOSIS — Z1211 Encounter for screening for malignant neoplasm of colon: Secondary | ICD-10-CM

## 2016-08-21 MED ORDER — SUPREP BOWEL PREP KIT 17.5-3.13-1.6 GM/177ML PO SOLN: 1.0000 | Freq: Once | ORAL | 0 refills | Status: AC

## 2016-08-21 NOTE — Progress Notes (Signed)
No allergies to eggs or soy No past problems with anesthesia No diet meds No home oxygen  Declined emmi

## 2016-08-29 MED FILL — SUPREP BOWEL PREP KIT: 17.5-3.13-1 | 1 days supply | Qty: 354 | Fill #0

## 2016-09-02 ENCOUNTER — Ambulatory Visit (AMBULATORY_SURGERY_CENTER): Payer: 59 | Admitting: Gastroenterology

## 2016-09-02 ENCOUNTER — Encounter: Payer: Self-pay | Admitting: Gastroenterology

## 2016-09-02 VITALS — BP 98/66 | HR 73 | Temp 96.9°F | Resp 20 | Ht 65.0 in | Wt 180.0 lb

## 2016-09-02 DIAGNOSIS — Z1212 Encounter for screening for malignant neoplasm of rectum: Secondary | ICD-10-CM

## 2016-09-02 DIAGNOSIS — K7581 Nonalcoholic steatohepatitis (NASH): Secondary | ICD-10-CM | POA: Diagnosis not present

## 2016-09-02 DIAGNOSIS — G4733 Obstructive sleep apnea (adult) (pediatric): Secondary | ICD-10-CM | POA: Diagnosis not present

## 2016-09-02 DIAGNOSIS — Z1211 Encounter for screening for malignant neoplasm of colon: Secondary | ICD-10-CM

## 2016-09-02 MED ORDER — SODIUM CHLORIDE 0.9 % IV SOLN: 500.0000 mL | INTRAVENOUS | Status: DC

## 2016-09-02 NOTE — Progress Notes (Signed)
Report to PACU, RN, vss, BBS= Clear.  

## 2016-09-02 NOTE — Op Note (Signed)
[facility] Patient Name: Terry Bell Procedure Date: 09/02/2016 1:41 PM MRN: [id number] Endoscopist: Ladene Artist , MD Age: 60 Referring MD:  Date of Birth: 10-Dec-1955 Gender: Female Account #: [id number] Procedure:                Colonoscopy Indications:              Screening for colorectal malignant neoplasm Medicines:                Monitored Anesthesia Care Procedure:                Pre-Anesthesia Assessment:                           - Prior to the procedure, a History and Physical                            was performed, and patient medications and                            allergies were reviewed. The patient's tolerance of                            previous anesthesia was also reviewed. The risks                            and benefits of the procedure and the sedation                            options and risks were discussed with the patient.                            All questions were answered, and informed consent                            was obtained. Prior Anticoagulants: The patient has                            taken no previous anticoagulant or antiplatelet                            agents. ASA Grade Assessment: II - A patient with                            mild systemic disease. After reviewing the risks                            and benefits, the patient was deemed in                            satisfactory condition to undergo the procedure.                           After obtaining informed consent, the colonoscope  was passed under direct vision. Throughout the                            procedure, the patient's blood pressure, pulse, and                            oxygen saturations were monitored continuously. The                            Model PCF-[id number]) scope was introduced                            through the anus and advanced to the the cecum,                            identified by  appendiceal orifice and ileocecal                            valve. The ileocecal valve, appendiceal orifice,                            and rectum were photographed. The quality of the                            bowel preparation was good. The colonoscopy was                            performed without difficulty. The patient tolerated                            the procedure well. Scope In: 1:47:19 PM Scope Out: 1:57:56 PM Scope Withdrawal Time: 0 hours 8 minutes 12 seconds  Total Procedure Duration: 0 hours 10 minutes 37 seconds  Findings:                 The perianal and digital rectal examinations were                            normal.                           Multiple medium-mouthed diverticula were found in                            the sigmoid colon and descending colon. There was                            evidence of diverticular spasm.                           The exam was otherwise without abnormality on                            direct and retroflexion views. Complications:            No immediate complications. Estimated  blood loss:                            None. Estimated Blood Loss:     Estimated blood loss: none. Impression:               - Moderate diverticulosis in the sigmoid colon and                            in the descending colon. There was evidence of                            diverticular spasm.                           - The examination was otherwise normal on direct                            and retroflexion views.                           - No specimens collected. Recommendation:           - Repeat colonoscopy in 10 years for screening                            purposes.                           - Patient has a contact number available for                            emergencies. The signs and symptoms of potential                            delayed complications were discussed with the                            patient. Return to normal  activities tomorrow.                            Written discharge instructions were provided to the                            patient.                           - Continue present medications.                           - High fiber diet indefinitely. Ladene Artist, MD 09/02/2016 2:06:34 PM This report has been signed electronically.

## 2016-09-02 NOTE — Patient Instructions (Signed)
YOU HAD AN ENDOSCOPIC PROCEDURE TODAY AT [facility]:   Refer to the procedure report that was given to you for any specific questions about what was found during the examination.  If the procedure report does not answer your questions, please call your gastroenterologist to clarify.  If you requested that your care partner not be given the details of your procedure findings, then the procedure report has been included in a sealed envelope for you to review at your convenience later.  YOU SHOULD EXPECT: Some feelings of bloating in the abdomen. Passage of more gas than usual.  Walking can help get rid of the air that was put into your GI tract during the procedure and reduce the bloating. If you had a lower endoscopy (such as a colonoscopy or flexible sigmoidoscopy) you may notice spotting of blood in your stool or on the toilet paper. If you underwent a bowel prep for your procedure, you may not have a normal bowel movement for a few days.  Please Note:  You might notice some irritation and congestion in your nose or some drainage.  This is from the oxygen used during your procedure.  There is no need for concern and it should clear up in a day or so.  SYMPTOMS TO REPORT IMMEDIATELY:   Following lower endoscopy (colonoscopy or flexible sigmoidoscopy):  Excessive amounts of blood in the stool  Significant tenderness or worsening of abdominal pains  Swelling of the abdomen that is new, acute  Fever of 100F or higher   For urgent or emergent issues, a gastroenterologist can be reached at any hour by calling [phone].   DIET:  We do recommend a small meal at first, but then you may proceed to your regular diet.  Drink plenty of fluids but you should avoid alcoholic beverages for 24 hours. Try to increase the fiber in your diet, and drink plenty of water.  ACTIVITY:  You should plan to take it easy for the rest of today and you should NOT DRIVE or use heavy machinery until  tomorrow (because of the sedation medicines used during the test).    FOLLOW UP: Our staff will call the number listed on your records the next business day following your procedure to check on you and address any questions or concerns that you may have regarding the information given to you following your procedure. If we do not reach you, we will leave a message.  However, if you are feeling well and you are not experiencing any problems, there is no need to return our call.  We will assume that you have returned to your regular daily activities without incident.  If any biopsies were taken you will be contacted by phone or by letter within the next 1-3 weeks.  Please call us at [phone] if you have not heard about the biopsies in 3 weeks.    SIGNATURES/CONFIDENTIALITY: You and/or your care partner have signed paperwork which will be entered into your electronic medical record.  These signatures attest to the fact that that the information above on your After Visit Summary has been reviewed and is understood.  Full responsibility of the confidentiality of this discharge information lies with you and/or your care-partner.  Thank-you for choosing [facility] for your healthcare needs today.  Read all of the handouts given to you by your recovery room nurse.

## 2016-09-03 ENCOUNTER — Ambulatory Visit (INDEPENDENT_AMBULATORY_CARE_PROVIDER_SITE_OTHER): Payer: 59 | Admitting: Pulmonary Disease

## 2016-09-03 ENCOUNTER — Telehealth: Payer: Self-pay

## 2016-09-03 ENCOUNTER — Encounter: Payer: Self-pay | Admitting: Pulmonary Disease

## 2016-09-03 ENCOUNTER — Telehealth: Payer: Self-pay | Admitting: *Deleted

## 2016-09-03 VITALS — BP 102/70 | HR 68 | Ht 65.5 in | Wt 176.0 lb

## 2016-09-03 DIAGNOSIS — G4733 Obstructive sleep apnea (adult) (pediatric): Secondary | ICD-10-CM

## 2016-09-03 NOTE — Telephone Encounter (Signed)
No answer, left message to call if questions or concerns. 

## 2016-09-03 NOTE — Telephone Encounter (Signed)
  Follow up Call-  Call back number 09/02/2016  Post procedure Call Back phone  # [phone]  Permission to leave phone message Yes  Some recent data might be hidden     Patient questions:  Do you have a fever, pain , or abdominal swelling? No. Pain Score  0 *  Have you tolerated food without any problems? Yes.    Have you been able to return to your normal activities? Yes.    Do you have any questions about your discharge instructions: Diet   No. Medications  No. Follow up visit  No.  Do you have questions or concerns about your Care? No.  Actions: * If pain score is 4 or above: No action needed, pain <4.

## 2016-09-03 NOTE — Progress Notes (Signed)
Current Outpatient Prescriptions on File Prior to Visit  Medication Sig  . calcium carbonate (OS-CAL) 600 MG TABS tablet Take 600 mg by mouth 2 (two) times daily with a meal.   . Cholecalciferol (VITAMIN D) 1000 UNITS capsule Take 1,000 Units by mouth 2 (two) times daily.   . mometasone (NASONEX) 50 MCG/ACT nasal spray Place 2 sprays into the nose as needed.  . Probiotic Product (ALIGN) 4 MG CAPS Take 4 mg by mouth daily.  . vitamin E 400 UNIT capsule Take 800 Units by mouth.    Current Facility-Administered Medications on File Prior to Visit  Medication  . 0.9 %  sodium chloride infusion     Chief Complaint  Patient presents with  . Follow-up    Review HST     Tests HST 07/19/16 >> AHI 23, SaO2 low 79%  Past medical history Arthritis, Depression, Diverticulosis, Tinnitus  Past surgical history, Family history, Social history, Allergies all reviewed.  Vital Signs BP 102/70 (BP Location: Right Arm, Cuff Size: Normal)   Pulse 68   Ht 5' 5.5" (1.664 m)   Wt 176 lb (79.8 kg)   SpO2 97%   BMI 28.84 kg/m   History of Present Illness Terry Bell is a 60 y.o. female with obstructive sleep apnea.  She is here to review her sleep study.  This showed moderate sleep apnea.  Physical Exam  General - No distress ENT - No sinus tenderness, no oral exudate, no LAN, MP 4, scalloped tongue Cardiac - s1s2 regular, no murmur Chest - No wheeze/rales/dullness Back - No focal tenderness Abd - Soft, non-tender Ext - No edema Neuro - Normal strength Skin - No rashes Psych - normal mood, and behavior   Assessment/Plan  Obstructive sleep apnea. - We discussed how sleep apnea can affect various health problems, including risks for hypertension, cardiovascular disease, and diabetes.  We also discussed how sleep disruption can increase risks for accidents, such as while driving.  Weight loss as a means of improving sleep apnea was also reviewed.  Additional treatment options  discussed were CPAP therapy, oral appliance, and surgical intervention. - she would like to try oral appliance first  - will arrange for referral to Dr. Oneal Grout to assess for oral appliance   Patient Instructions  Will arrange for referral to Dr. Oneal Grout to assess for oral appliance to treat obstructive sleep apnea  Follow up in 6 months   Chesley Mires, MD  Pulmonary/Critical Care/Sleep Pager:  [phone] 09/03/2016, 3:03 PM

## 2016-09-03 NOTE — Patient Instructions (Signed)
Will arrange for referral to Dr. Oneal Grout to assess for oral appliance to treat obstructive sleep apnea  Follow up in 6 months

## 2016-10-22 NOTE — Progress Notes (Signed)
HPI:  Follow up:  Terry Bell is a pleasant 60 yo with a PMH significant for Obesity, OSA, Fatty liver and allergies here for follow up. Reports she is doing quite well. New grandaaughter in [place] recently. Exercising and eating healthy. Feels much better since using mouth piece for OSA. Feels energy, daytime somnolence and snoring have improved.  Doing labs and [place] with liver doctor every 6 months.  Overweight/snoring/OSA: -on belviq in the past -eating healthy and exercising -saw pulm for sleep apnea testing - using mouth piece and will be retested - feels much better  Allergic Rhinitis/Tinnitus: -uses INS seasonally -mild tinnitus chronically  NASH: -with fibrosis -sees hepatologist for this as has a family hx of hepatic cancer - monitored with labs and [place] q 6 months and endoscopy (Dr. Mosetta Pigeon with [facility]) -diet and exercise: paleo; regular exercise -denies: abd pain, nausea, vomiting  Urinary urgency: -for a very long at least 5 years, mild urgency -tried vesicare with prior PCP which helped   ROS: See pertinent positives and negatives per HPI.  Past Medical History:  Diagnosis Date  . Arthritis   . Continuous leakage of urine   . Depression   . Diverticulosis   . OSA (obstructive sleep apnea) 07/22/2016  . Steatohepatitis   . Tinnitus   . Varicose veins     Past Surgical History:  Procedure Laterality Date  . arthroscopic left knee sugery    . bilateral bunionectomy    . CHOLECYSTECTOMY    . colonsocopy    . debridement and removal and lateral release of torn cartlidge in rt knee    . gum grating    . rt knee arthroscopy    . torn cartledge in rt knee      Family History  Problem Relation Age of Onset  . Diabetes Mother   . Liver disease Mother   . Diabetes Sister   . Liver disease Sister   . Hypertension    . Cancer    . Hemochromatosis    . Cancer Father     Throat  . Colon cancer Neg Hx     Social History   Social History   . Marital status: Married    Spouse name: N/A  . Number of children: N/A  . 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 Smoking status: Never Smoker  . Smokeless tobacco: Never Used  . Alcohol use No  . Drug use: No  . Sexual activity: Not Asked   Other Topics Concern  . 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  calcium carbonate (OS-CAL) 600 MG TABS tablet, Take 600 mg by mouth 2 (two) times daily with a meal. , Disp: , Rfl:  .  Cholecalciferol (VITAMIN D) 1000 UNITS capsule, Take 1,000 Units by mouth 2 (two) times daily. , Disp: , Rfl:  .  mometasone (NASONEX) 50 MCG/ACT nasal spray, Place 2 sprays into the nose as needed., Disp: 17 g, Rfl: 6 .  Probiotic Product (ALIGN) 4 MG CAPS, Take 4 mg by mouth daily., Disp: , Rfl:  .  vitamin E 400 UNIT capsule, Take 800 Units by mouth. , Disp: , Rfl:   Current Facility-Administered Medications:  .  0.9 %  sodium chloride infusion, 500 mL, Intravenous,  Continuous, Ladene Artist, MD  EXAM:  Vitals:   10/23/16 0845  BP: 118/82  Pulse: 88  Temp: 97.5 F (36.4 C)    Body mass index is 28.73 kg/m.  GENERAL: vitals reviewed and listed above, alert, oriented, appears well hydrated and in no acute distress  HEENT: atraumatic, conjunttiva clear, no obvious abnormalities on inspection of external nose and ears  NECK: no obvious masses on inspection  LUNGS: clear to auscultation bilaterally, no wheezes, rales or rhonchi, good air movement  CV: HRRR, no peripheral edema  MS: moves all extremities without noticeable abnormality  PSYCH: pleasant and cooperative, no obvious depression or anxiety  ASSESSMENT AND PLAN:  Discussed the following assessment and plan:  OSA (obstructive sleep apnea)  NASH (nonalcoholic steatohepatitis)  Fibrosis of liver  (HCC)  BMI 28.0-28.9,adult  -continued healthy lifestyle and hepatology follow up advised -follow up yearly here -Patient advised to return or notify a doctor immediately if symptoms worsen or persist or new concerns arise.  Patient Instructions  BEFORE YOU LEAVE: -follow up: yearly for physical  We recommend the following healthy lifestyle for LIFE: 1) Small portions.   Tip: eat off of a salad plate instead of a dinner plate.  Tip: It is ok to feel hungry after a meal of proper portion sizes  Tip: if you need more or a snack choose fruits, veggies and/or a handful of nuts or seeds.  2) Eat a healthy clean diet.  * Tip: Avoid (less then 1 serving per week): processed foods, sweets, sweetened drinks, white starches (rice, flour, bread, potatoes, pasta, etc), red meat, fast foods, butter  *Tip: CHOOSE instead   * 5-9 servings per day of fresh or frozen fruits and vegetables (but not corn, potatoes, bananas, canned or dried fruit)   *nuts and seeds, beans   *olives and olive oil   *small portions of lean meats such as fish and white chicken    *small portions of whole grains  3)Get at least 150 minutes of sweaty aerobic exercise per week.  4)Reduce stress - consider counseling, meditation and relaxation to balance other aspects of your life.    Colin Benton R., DO

## 2016-10-23 ENCOUNTER — Ambulatory Visit (INDEPENDENT_AMBULATORY_CARE_PROVIDER_SITE_OTHER): Payer: 59 | Admitting: Family Medicine

## 2016-10-23 ENCOUNTER — Encounter: Payer: Self-pay | Admitting: Family Medicine

## 2016-10-23 VITALS — BP 118/82 | HR 88 | Temp 97.5°F | Ht 65.5 in | Wt 175.3 lb

## 2016-10-23 DIAGNOSIS — Z6828 Body mass index (BMI) 28.0-28.9, adult: Secondary | ICD-10-CM

## 2016-10-23 DIAGNOSIS — K7581 Nonalcoholic steatohepatitis (NASH): Secondary | ICD-10-CM | POA: Diagnosis not present

## 2016-10-23 DIAGNOSIS — G4733 Obstructive sleep apnea (adult) (pediatric): Secondary | ICD-10-CM

## 2016-10-23 DIAGNOSIS — K7400 Hepatic fibrosis, unspecified: Secondary | ICD-10-CM

## 2016-10-23 NOTE — Patient Instructions (Addendum)
BEFORE YOU LEAVE: -follow up: yearly for physical  We recommend the following healthy lifestyle for LIFE: 1) Small portions.   Tip: eat off of a salad plate instead of a dinner plate.  Tip: It is ok to feel hungry after a meal of proper portion sizes  Tip: if you need more or a snack choose fruits, veggies and/or a handful of nuts or seeds.  2) Eat a healthy clean diet.  * Tip: Avoid (less then 1 serving per week): processed foods, sweets, sweetened drinks, white starches (rice, flour, bread, potatoes, pasta, etc), red meat, fast foods, butter  *Tip: CHOOSE instead   * 5-9 servings per day of fresh or frozen fruits and vegetables (but not corn, potatoes, bananas, canned or dried fruit)   *nuts and seeds, beans   *olives and olive oil   *small portions of lean meats such as fish and white chicken    *small portions of whole grains  3)Get at least 150 minutes of sweaty aerobic exercise per week.  4)Reduce stress - consider counseling, meditation and relaxation to balance other aspects of your life.

## 2016-10-23 NOTE — Progress Notes (Signed)
Pre visit review using our clinic review tool, if applicable. No additional management support is needed unless otherwise documented below in the visit note. 

## 2016-10-28 ENCOUNTER — Other Ambulatory Visit: Payer: Self-pay | Admitting: Family Medicine

## 2016-10-28 DIAGNOSIS — N6320 Unspecified lump in the left breast, unspecified quadrant: Secondary | ICD-10-CM

## 2016-11-20 DIAGNOSIS — G4733 Obstructive sleep apnea (adult) (pediatric): Secondary | ICD-10-CM | POA: Diagnosis not present

## 2016-11-24 ENCOUNTER — Ambulatory Visit
Admission: RE | Admit: 2016-11-24 | Discharge: 2016-11-24 | Disposition: A | Discharge status: Home / Self Care | Payer: 59 | Source: Ambulatory Visit | Attending: Family Medicine | Admitting: Family Medicine

## 2016-11-24 DIAGNOSIS — N6322 Unspecified lump in the left breast, upper inner quadrant: Secondary | ICD-10-CM | POA: Diagnosis not present

## 2016-11-24 DIAGNOSIS — N6320 Unspecified lump in the left breast, unspecified quadrant: Secondary | ICD-10-CM

## 2016-12-12 ENCOUNTER — Ambulatory Visit (INDEPENDENT_AMBULATORY_CARE_PROVIDER_SITE_OTHER): Payer: 59 | Admitting: Family Medicine

## 2016-12-12 ENCOUNTER — Encounter: Payer: Self-pay | Admitting: Family Medicine

## 2016-12-12 VITALS — BP 100/80 | HR 72 | Temp 98.2°F | Ht 65.5 in | Wt 174.7 lb

## 2016-12-12 DIAGNOSIS — N3001 Acute cystitis with hematuria: Secondary | ICD-10-CM | POA: Diagnosis not present

## 2016-12-12 DIAGNOSIS — R300 Dysuria: Secondary | ICD-10-CM

## 2016-12-12 LAB — POCT URINALYSIS DIPSTICK
Bilirubin, UA: NEGATIVE
Glucose, UA: NEGATIVE
Ketones, UA: NEGATIVE
Nitrite, UA: POSITIVE
Spec Grav, UA: >=1.03
Urobilinogen, UA: 0.2
pH, UA: 5

## 2016-12-12 MED ORDER — NITROFURANTOIN MONOHYD MACRO 100 MG PO CAPS: 100.0000 mg | ORAL_CAPSULE | Freq: Two times a day (BID) | ORAL | 0 refills | Status: DC

## 2016-12-12 MED FILL — NITROFURANTOIN MONO-MCR 100: 100 | 7 days supply | Qty: 14 | Fill #0

## 2016-12-12 NOTE — Progress Notes (Signed)
HPI:  Acute visit for:  Dysuria: -started 3-4 days ago -frequency, urgency, dysuria -denies: fevers, malaise, nausea, abd pain, hematuria, flank pain, vaginal symptoms  ROS: See pertinent positives and negatives per HPI.  Past Medical History:  Diagnosis Date  . Arthritis   . Continuous leakage of urine   . Depression   . Diverticulosis   . OSA (obstructive sleep apnea) 07/22/2016  . Steatohepatitis   . Tinnitus   . Varicose veins     Past Surgical History:  Procedure Laterality Date  . arthroscopic left knee sugery    . bilateral bunionectomy    . CHOLECYSTECTOMY    . colonsocopy    . debridement and removal and lateral release of torn cartlidge in rt knee    . gum grating    . rt knee arthroscopy    . torn cartledge in rt knee      Family History  Problem Relation Age of Onset  . Diabetes Mother   . Liver disease Mother   . Diabetes Sister   . Liver disease Sister   . Hypertension    . Cancer    . Hemochromatosis    . Cancer Father     Throat  . Colon cancer Neg Hx     Social History   Social History  . Marital status: Married    Spouse name: N/A  . Number of children: N/A  . 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 Smoking status: Never Smoker  . Smokeless tobacco: Never Used  . Alcohol use No  . Drug use: No  . Sexual activity: Not Asked   Other Topics Concern  . 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  calcium carbonate (OS-CAL) 600 MG TABS tablet, Take 600 mg by mouth 2 (two) times daily with a meal. , Disp: , Rfl:  .  Cholecalciferol (VITAMIN D) 1000 UNITS capsule, Take 1,000 Units by mouth 2 (two) times daily. , Disp: , Rfl:  .  mometasone (NASONEX) 50 MCG/ACT nasal spray, Place 2 sprays into the nose as needed., Disp: 17 g, Rfl: 6 .  Probiotic Product (ALIGN)  4 MG CAPS, Take 4 mg by mouth daily., Disp: , Rfl:  .  vitamin E 400 UNIT capsule, Take 800 Units by mouth. , Disp: , Rfl:  .  nitrofurantoin, macrocrystal-monohydrate, (MACROBID) 100 MG capsule, Take 1 capsule (100 mg total) by mouth 2 (two) times daily., Disp: 14 capsule, Rfl: 0  Current Facility-Administered Medications:  .  0.9 %  sodium chloride infusion, 500 mL, Intravenous, Continuous, Ladene Artist, MD  EXAM:  Vitals:   12/12/16 1025  BP: 100/80  Pulse: 72  Temp: 98.2 F (36.8 C)    Body mass index is 28.63 kg/m.  GENERAL: vitals reviewed and listed above, alert, oriented, appears well hydrated and in no acute distress  HEENT: atraumatic, conjunttiva clear, no obvious abnormalities on inspection of external nose and ears  NECK: no obvious masses on inspection  LUNGS: clear to auscultation bilaterally, no wheezes, rales or rhonchi, good air movement  CV: HRRR, no peripheral edema  ABD: BS+, soft, NTTP, no CVA TTP  MS: moves all extremities without noticeable abnormality  PSYCH: pleasant and cooperative, no obvious depression or anxiety  ASSESSMENT AND PLAN:  Discussed the following assessment and plan:  Dysuria - Plan: POC Urinalysis Dipstick  Acute cystitis with hematuria  -udip + symptoms c/w LUTI -abx and risks discussed, rx sent -Patient advised to return or notify a doctor immediately if symptoms worsen or persist or new concerns arise.  Patient Instructions  Take the antibiotic (Macrobid) - start today.  Plenty of water.  Azo if needed for symptoms.  Follow up if worsening, new symptoms or symptoms persist despite treatment.  I hope you feel better soon!   Terry Bell R., DO

## 2016-12-12 NOTE — Patient Instructions (Signed)
Take the antibiotic (Macrobid) - start today.  Plenty of water.  Azo if needed for symptoms.  Follow up if worsening, new symptoms or symptoms persist despite treatment.  I hope you feel better soon!

## 2016-12-12 NOTE — Progress Notes (Signed)
Pre visit review using our clinic review tool, if applicable. No additional management support is needed unless otherwise documented below in the visit note. 

## 2016-12-17 DIAGNOSIS — K760 Fatty (change of) liver, not elsewhere classified: Secondary | ICD-10-CM | POA: Diagnosis not present

## 2017-03-26 ENCOUNTER — Encounter: Payer: Self-pay | Admitting: Family Medicine

## 2017-03-26 ENCOUNTER — Ambulatory Visit (INDEPENDENT_AMBULATORY_CARE_PROVIDER_SITE_OTHER): Payer: 59 | Admitting: Family Medicine

## 2017-03-26 VITALS — BP 98/60 | HR 65 | Temp 98.2°F | Ht 65.5 in | Wt 170.9 lb

## 2017-03-26 DIAGNOSIS — M2550 Pain in unspecified joint: Secondary | ICD-10-CM | POA: Diagnosis not present

## 2017-03-26 DIAGNOSIS — M79641 Pain in right hand: Secondary | ICD-10-CM | POA: Diagnosis not present

## 2017-03-26 DIAGNOSIS — M79642 Pain in left hand: Secondary | ICD-10-CM | POA: Diagnosis not present

## 2017-03-26 NOTE — Patient Instructions (Signed)
BEFORE YOU LEAVE: -lab -xray sheet -follow up: 1 month  Go get xray  We have ordered labs or studies at this visit. It can take up to 1-2 weeks for results and processing. IF results require follow up or explanation, we will call you with instructions. Clinically stable results will be released to your [facility]. If you have not heard from [place] or cannot find your results in [facility] in 2 weeks please contact our office at [phone].  If you are not yet signed up for [facility], please consider signing up.  Topical menthol (tiger balm) or capsaicin for pain if needed. Limited ibuprofen or aleve if needed.  Stop calcium.  Continue vitamin d.

## 2017-03-26 NOTE — Progress Notes (Signed)
HPI:  Acute visit for hand pain: -x3 weeks -bilat mod to at times severe pain MCPs and with she feels new deformity/swelling -denies trauma, new activities, redness, warmth, pain in other joints recently -has known OA hands distal IP jts -no fevers, malaise -worried about RA  ROS: See pertinent positives and negatives per HPI.  Past Medical History:  Diagnosis Date  . Arthritis   . Continuous leakage of urine   . Depression   . Diverticulosis   . OSA (obstructive sleep apnea) 07/22/2016  . Steatohepatitis   . Tinnitus   . Varicose veins     Past Surgical History:  Procedure Laterality Date  . arthroscopic left knee sugery    . bilateral bunionectomy    . CHOLECYSTECTOMY    . colonsocopy    . debridement and removal and lateral release of torn cartlidge in rt knee    . gum grating    . rt knee arthroscopy    . torn cartledge in rt knee      Family History  Problem Relation Age of Onset  . Diabetes Mother   . Liver disease Mother   . Diabetes Sister   . Liver disease Sister   . Hypertension Unknown   . Cancer Unknown   . Hemochromatosis Unknown   . Cancer Father        Throat  . Colon cancer Neg Hx     Social History   Social History  . Marital status: Married    Spouse name: N/A  . Number of children: N/A  . 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 Smoking status: Never Smoker  . Smokeless tobacco: Never Used  . Alcohol use No  . Drug use: No  . Sexual activity: Not Asked   Other Topics Concern  . 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  calcium carbonate (OS-CAL) 600 MG TABS tablet, Take 600 mg by mouth 2 (two) times daily with a meal. , Disp: , Rfl:  .  Cholecalciferol (VITAMIN D) 1000 UNITS capsule, Take 1,000 Units by mouth 2 (two) times daily. , Disp: , Rfl:  .   mometasone (NASONEX) 50 MCG/ACT nasal spray, Place 2 sprays into the nose as needed., Disp: 17 g, Rfl: 6 .  nitrofurantoin, macrocrystal-monohydrate, (MACROBID) 100 MG capsule, Take 1 capsule (100 mg total) by mouth 2 (two) times daily., Disp: 14 capsule, Rfl: 0 .  Probiotic Product (ALIGN) 4 MG CAPS, Take 4 mg by mouth daily., Disp: , Rfl:  .  vitamin E 400 UNIT capsule, Take 800 Units by mouth. , Disp: , Rfl:   Current Facility-Administered Medications:  .  0.9 %  sodium chloride infusion, 500 mL, Intravenous, Continuous, Ladene Artist, MD  EXAM:  Vitals:   03/26/17 1614  BP: 98/60  Pulse: 65  Temp: 98.2 F (36.8 C)    Body mass index is 28.01 kg/m.  GENERAL: vitals reviewed and listed above, alert, oriented, appears well hydrated and in no acute distress  HEENT: atraumatic, conjunttiva clear, no obvious abnormalities on inspection of external nose and ears  NECK: no obvious masses on inspection  MS: moves all extremities without noticeable abnormality, bilateral deformity/enlargement 2nd MCPs R> L TTP, no redness or warmth, normal RPM/strength/NV exam hands, distal IPs  with OA  PSYCH: pleasant and cooperative, no obvious depression or anxiety  ASSESSMENT AND PLAN:  Discussed the following assessment and plan:  Pain in both hands - Plan: DG Hand Complete Right  Polyarthralgia - Plan: Cyclic citrul peptide antibody, IgG, Rheumatoid Factor  -we discussed possible serious and likely etiologies, workup and treatment, treatment risks and return precautions -after this discussion, Terry Bell opted for labs per orders, plain films, conservativ etx pending results, consideration referral to hand specialist  -follow up advised 1 month -of course, we advised Terry Bell  to return or notify a doctor immediately if symptoms worsen or persist or new concerns arise.  . Patient Instructions  BEFORE YOU LEAVE: -lab -xray sheet -follow up: 1 month  Go get xray  We have ordered labs or  studies at this visit. It can take up to 1-2 weeks for results and processing. IF results require follow up or explanation, we will call you with instructions. Clinically stable results will be released to your [facility]. If you have not heard from [place] or cannot find your results in [facility] in 2 weeks please contact our office at [phone].  If you are not yet signed up for [facility], please consider signing up.  Topical menthol (tiger balm) or capsaicin for pain if needed. Limited ibuprofen or aleve if needed.  Stop calcium.  Continue vitamin d.         [name]., DO

## 2017-03-27 LAB — RHEUMATOID FACTOR: Rhuematoid fact SerPl-aCnc: <14 IU/mL (ref <14)

## 2017-03-27 LAB — CYCLIC CITRUL PEPTIDE ANTIBODY, IGG: Cyclic Citrullin Peptide Ab: <16 Units

## 2017-03-31 ENCOUNTER — Ambulatory Visit (INDEPENDENT_AMBULATORY_CARE_PROVIDER_SITE_OTHER)
Admission: RE | Admit: 2017-03-31 | Discharge: 2017-03-31 | Disposition: A | Discharge status: Home / Self Care | Payer: 59 | Source: Ambulatory Visit | Attending: Family Medicine | Admitting: Family Medicine

## 2017-03-31 ENCOUNTER — Other Ambulatory Visit: Payer: Self-pay | Admitting: *Deleted

## 2017-03-31 DIAGNOSIS — M79641 Pain in right hand: Secondary | ICD-10-CM

## 2017-03-31 DIAGNOSIS — M19041 Primary osteoarthritis, right hand: Secondary | ICD-10-CM

## 2017-03-31 DIAGNOSIS — M79642 Pain in left hand: Secondary | ICD-10-CM | POA: Diagnosis not present

## 2017-03-31 DIAGNOSIS — M7989 Other specified soft tissue disorders: Secondary | ICD-10-CM | POA: Diagnosis not present

## 2017-04-09 DIAGNOSIS — Z041 Encounter for examination and observation following transport accident: Secondary | ICD-10-CM | POA: Diagnosis not present

## 2017-04-09 DIAGNOSIS — S20212A Contusion of left front wall of thorax, initial encounter: Secondary | ICD-10-CM | POA: Diagnosis not present

## 2017-04-09 DIAGNOSIS — R10816 Epigastric abdominal tenderness: Secondary | ICD-10-CM | POA: Diagnosis not present

## 2017-04-09 DIAGNOSIS — R52 Pain, unspecified: Secondary | ICD-10-CM | POA: Diagnosis not present

## 2017-04-14 DIAGNOSIS — L089 Local infection of the skin and subcutaneous tissue, unspecified: Secondary | ICD-10-CM | POA: Diagnosis not present

## 2017-04-14 DIAGNOSIS — D2272 Melanocytic nevi of left lower limb, including hip: Secondary | ICD-10-CM | POA: Diagnosis not present

## 2017-04-14 DIAGNOSIS — D235 Other benign neoplasm of skin of trunk: Secondary | ICD-10-CM | POA: Diagnosis not present

## 2017-04-14 DIAGNOSIS — D2261 Melanocytic nevi of right upper limb, including shoulder: Secondary | ICD-10-CM | POA: Diagnosis not present

## 2017-04-14 DIAGNOSIS — D2371 Other benign neoplasm of skin of right lower limb, including hip: Secondary | ICD-10-CM | POA: Diagnosis not present

## 2017-04-14 DIAGNOSIS — D2271 Melanocytic nevi of right lower limb, including hip: Secondary | ICD-10-CM | POA: Diagnosis not present

## 2017-04-14 DIAGNOSIS — D2262 Melanocytic nevi of left upper limb, including shoulder: Secondary | ICD-10-CM | POA: Diagnosis not present

## 2017-04-14 DIAGNOSIS — D485 Neoplasm of uncertain behavior of skin: Secondary | ICD-10-CM | POA: Diagnosis not present

## 2017-04-14 DIAGNOSIS — D225 Melanocytic nevi of trunk: Secondary | ICD-10-CM | POA: Diagnosis not present

## 2017-04-14 DIAGNOSIS — L814 Other melanin hyperpigmentation: Secondary | ICD-10-CM | POA: Diagnosis not present

## 2017-04-16 ENCOUNTER — Encounter: Payer: Self-pay | Admitting: Family Medicine

## 2017-04-16 ENCOUNTER — Ambulatory Visit (INDEPENDENT_AMBULATORY_CARE_PROVIDER_SITE_OTHER): Payer: 59 | Admitting: Family Medicine

## 2017-04-16 DIAGNOSIS — S20212A Contusion of left front wall of thorax, initial encounter: Secondary | ICD-10-CM

## 2017-04-16 NOTE — Patient Instructions (Addendum)
BEFORE YOU LEAVE: -follow up in 1 week -letter with restrictions for no lifting/pushing/pulling over 20lbs  Can use ice, ibuprofen, lidocaine patches and/or tiger balm (menthol) as needed for pain and discomfort.  Please not strenuous physical activity - running, climbing or lifting/pushing/pulling over 20lbs until feeling better of re-eval in 1 week.  I hope you are feeling better soon! Seek care immediately if worsening, new concerns or you are not improving with treatment.    WE NOW OFFER   [facility] FAST TRACK!!!  SAME DAY Appointments for ACUTE CARE  Such as: Sprains, Injuries, cuts, abrasions, rashes, muscle pain, joint pain, back pain Colds, flu, sore throats, headache, allergies, cough, fever  Ear pain, sinus and eye infections Abdominal pain, nausea, vomiting, diarrhea, upset stomach Animal/insect bites  3 Easy Ways to Schedule: Walk-In Scheduling Call in scheduling Mychart Sign-up: [email or web address]
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
## 2017-04-17 ENCOUNTER — Telehealth: Payer: Self-pay | Admitting: Family Medicine
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
## 2017-04-21 ENCOUNTER — Ambulatory Visit: Payer: 59 | Admitting: Family Medicine

## 2017-04-21 ENCOUNTER — Telehealth: Payer: Self-pay | Admitting: Family Medicine

## 2017-04-21 DIAGNOSIS — Z0289 Encounter for other administrative examinations: Secondary | ICD-10-CM

## 2017-04-21 NOTE — Telephone Encounter (Signed)
Patient dropped off ADA form to be filled out.   -fax form to [phone], attn: Ellwood Dense                                             Packet ID: [id number]  -Placed in dr's folder

## 2017-04-22 NOTE — Telephone Encounter (Signed)
Form received and filled out for work place accomodation.  Need to know what days the pt was off work.  Called and identified myself but was told that the pt is on her phone and cannot take my call.  Was asked to call back.  Will try again at a later time.

## 2017-04-22 NOTE — Telephone Encounter (Signed)
Please call pt on her cell [phone]
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
## 2017-04-23 ENCOUNTER — Encounter: Payer: Self-pay | Admitting: Family Medicine

## 2017-04-23 ENCOUNTER — Ambulatory Visit (INDEPENDENT_AMBULATORY_CARE_PROVIDER_SITE_OTHER): Payer: 59 | Admitting: Family Medicine

## 2017-04-23 VITALS — BP 118/70 | HR 63 | Temp 98.3°F | Ht 65.5 in | Wt 165.9 lb

## 2017-04-23 DIAGNOSIS — S2020XD Contusion of thorax, unspecified, subsequent encounter: Secondary | ICD-10-CM | POA: Diagnosis not present

## 2017-04-23 DIAGNOSIS — R0781 Pleurodynia: Secondary | ICD-10-CM

## 2017-04-23 NOTE — Telephone Encounter (Signed)
Form filled out and signed.  Pt picked up in the office today while seeing Dr. Maudie Mercury for follow up.  Copy sent to scan and I retained a copy for my records.

## 2017-04-23 NOTE — Telephone Encounter (Signed)
Pt picked up form and paid for it.

## 2017-04-23 NOTE — Patient Instructions (Signed)
See Misty for paperwork and work note

## 2017-04-27 ENCOUNTER — Telehealth: Payer: Self-pay | Admitting: Family Medicine
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
## 2017-04-28 ENCOUNTER — Encounter: Payer: Self-pay | Admitting: Family Medicine

## 2017-04-28 NOTE — Telephone Encounter (Signed)
Ok

## 2017-04-28 NOTE — Telephone Encounter (Signed)
Letter written and faxed to [employer] at [phone]

## 2017-04-28 NOTE — Telephone Encounter (Signed)
Received note from the pt in daily papers.  Will forward this message and that note to Dr. Maudie Mercury to approve letter.

## 2017-05-27 DIAGNOSIS — M79641 Pain in right hand: Secondary | ICD-10-CM | POA: Diagnosis not present

## 2017-05-27 DIAGNOSIS — M19041 Primary osteoarthritis, right hand: Secondary | ICD-10-CM | POA: Diagnosis not present

## 2017-05-27 DIAGNOSIS — M19042 Primary osteoarthritis, left hand: Secondary | ICD-10-CM | POA: Diagnosis not present

## 2017-06-16 ENCOUNTER — Telehealth: Payer: Self-pay | Admitting: *Deleted

## 2017-06-16 NOTE — Telephone Encounter (Signed)
I left a detailed message for the pt to see what we needed to do to help her with scheduling her bone density.

## 2017-06-16 NOTE — Telephone Encounter (Signed)
-----   Message from [name], DO sent at 06/15/2017  9:52 PM EDT ----- Please check on reason for overdue result and reschedule/reorder if needed. ----- Message ----- From: SYSTEM Sent: 06/12/2017  12:06 AM To: [name], DO

## 2017-06-17 DIAGNOSIS — K7581 Nonalcoholic steatohepatitis (NASH): Secondary | ICD-10-CM | POA: Diagnosis not present

## 2017-06-17 DIAGNOSIS — K760 Fatty (change of) liver, not elsewhere classified: Secondary | ICD-10-CM | POA: Diagnosis not present

## 2017-06-18 ENCOUNTER — Other Ambulatory Visit: Payer: Self-pay | Admitting: Family Medicine

## 2017-06-18 ENCOUNTER — Telehealth: Payer: Self-pay | Admitting: Family Medicine

## 2017-06-18 DIAGNOSIS — Z1231 Encounter for screening mammogram for malignant neoplasm of breast: Secondary | ICD-10-CM

## 2017-06-18 NOTE — Telephone Encounter (Signed)
Noted  

## 2017-06-18 NOTE — Telephone Encounter (Signed)
Pt states thanks for following up on bone density, but she had one done 05/2016, so is not due until 05/2018.

## 2017-06-23 ENCOUNTER — Ambulatory Visit
Admission: RE | Admit: 2017-06-23 | Discharge: 2017-06-23 | Disposition: A | Discharge status: Home / Self Care | Payer: 59 | Source: Ambulatory Visit | Attending: Family Medicine | Admitting: Family Medicine

## 2017-06-23 DIAGNOSIS — Z1231 Encounter for screening mammogram for malignant neoplasm of breast: Secondary | ICD-10-CM | POA: Diagnosis not present

## 2017-07-16 ENCOUNTER — Encounter: Payer: Self-pay | Admitting: Family Medicine

## 2017-08-10 ENCOUNTER — Encounter: Payer: Self-pay | Admitting: Family Medicine

## 2017-10-28 NOTE — Progress Notes (Signed)
HPI:  Here for CPE: Due for pap Had cbc, cmp 06/18/17 - declines other labs today - but may call if needs for CPE form for work/insurance  -Concerns and/or follow up today:  Overweight/snoring/OSA: -on belviq in the past -eating healthy and exercising - lost weight and feels great -saw pulm for sleep apnea testing - using mouth piece and will be retested - feels much better  Allergic Rhinitis/Tinnitus: -uses INS seasonally -mild tinnitus chronically  NASH: -with fibrosis -sees hepatologist for this as has a family hx of hepatic cancer - monitored with labs and [place] q 6 months and endoscopy (Dr. Mosetta Pigeon with [facility]) - reports liver scan was better after wt loss -diet and exercise: paleo; regular exercise -denies: abd pain, nausea, vomiting  OOB: -for a very long at least 5 years, mild urgency -tried vesicare with prior PCP which helped   -Diet: variety of foods, balance and well rounded, larger portion sizes -Exercise: no regular exercise -Taking folic acid, vitamin D or calcium: no -Diabetes and Dyslipidemia Screening: declined -Vaccines: see vaccine section EPIC -pap history: ? Last done 2015, always normal per pt - due today -FDLMP: see nursing notes -sexual activity: yes, female partner, no new partners -wants STI testing (Hep C if born [birth year]): no -FH breast, colon or ovarian ca: see FH Last mammogram: done 05/2017 Last colon cancer screening:colonosocpy 08/2016, Dr. Fuller Plan, per report repeat in 10 years Breast Ca Risk Assessment: see family history and pt history DEXA (>/= 65): 05/2016  -Alcohol, Tobacco, drug use: see social history  Review of Systems - no fevers, unintentional weight loss, vision loss, hearing loss, chest pain, sob, hemoptysis, melena, hematochezia, hematuria, genital discharge, changing or concerning skin lesions, bleeding, bruising, loc, thoughts of self harm or SI  Past Medical History:  Diagnosis Date  . Arthritis   . Continuous  leakage of urine   . Depression   . Diverticulosis   . OSA (obstructive sleep apnea) 07/22/2016  . Steatohepatitis   . Tinnitus   . Varicose veins     Past Surgical History:  Procedure Laterality Date  . arthroscopic left knee sugery    . bilateral bunionectomy    . CHOLECYSTECTOMY    . colonsocopy    . debridement and removal and lateral release of torn cartlidge in rt knee    . gum grating    . rt knee arthroscopy    . torn cartledge in rt knee      Family History  Problem Relation Age of Onset  . Diabetes Mother   . Liver disease Mother   . Diabetes Sister   . Liver disease Sister   . Hypertension Unknown   . Cancer Unknown   . Hemochromatosis Unknown   . Cancer Father        Throat  . Colon cancer Neg Hx     Social History   Socioeconomic History  . Marital status: Married    Spouse name: None  . Number of children: None  . Years of education: None  . Highest education level: None  Social Needs  . Financial resource strain: None  . Food insecurity - worry: None  . Food insecurity - inability: None  . Transportation needs - medical: None  . Transportation needs - non-medical: None  Occupational History  . 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 Smoking status: Never Smoker  . Smokeless tobacco: Never Used  Substance and Sexual Activity  . Alcohol use: No  .  Drug use: No  . Sexual activity: None  Other Topics Concern  . 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  Cholecalciferol (VITAMIN D) 1000 UNITS capsule, Take 1,000 Units by mouth 2 (two) times daily. , Disp: , Rfl:  .  Probiotic Product (ALIGN) 4 MG CAPS, Take 4 mg by mouth daily., Disp: , Rfl:  .  vitamin E 400 UNIT capsule, Take 800 Units by mouth. , Disp: , Rfl:   Current Facility-Administered Medications:  .  0.9 %  sodium chloride infusion, 500 mL, Intravenous, Continuous,  Ladene Artist, MD  EXAM:  Vitals:   10/29/17 0814  BP: 96/76  Pulse: (!) 58  Temp: 97.6 F (36.4 C)    GENERAL: vitals reviewed and listed below, alert, oriented, appears well hydrated and in no acute distress  HEENT: head atraumatic, PERRLA, normal appearance of eyes, ears, nose and mouth. moist mucus membranes.  NECK: supple, no masses or lymphadenopathy  LUNGS: clear to auscultation bilaterally, no rales, rhonchi or wheeze  CV: HRRR, no peripheral edema or cyanosis, normal pedal pulses  ABDOMEN: bowel sounds normal, soft, non tender to palpation, no masses, no rebound or guarding  BREAST: normal appearance - no skin lesions or discharge noted on inspection of both breasts, on palpation of both breast and axillary region no suspicious lesions appreciated today  GU: normal appearance of external genitalia - no lesions or masses appreciated, normal appearing vaginal mucosa - no abnormal discharge, normal appearance of cervix - no lesions or abnormal discharge observed - rectocele mild, pap obtained  RECTAL: deferred  SKIN: no rash or abnormal lesions  MS: normal gait, moves all extremities normally  NEURO: normal gait, speech and thought processing grossly intact, muscle tone grossly intact throughout  PSYCH: normal affect, pleasant and cooperative  ASSESSMENT AND PLAN:  Discussed the following assessment and plan:  PREVENTIVE EXAM: -Discussed and advised all [place] preventive services health task force level A and B recommendations for age, sex and risks. -Advised at least 150 minutes of exercise per week and a healthy diet with avoidance of (less then 1 serving per week) processed foods, white starches, red meat, fast foods and sweets and consisting of: * 5-9 servings of fresh fruits and vegetables (not corn or potatoes) *nuts and seeds, beans *olives and olive oil *lean meats such as fish and white chicken  *whole grains -labs, studies and vaccines per orders this  encounter  Patient advised to return to clinic immediately if symptoms worsen or persist or new concerns.  Patient Instructions  BEFORE YOU LEAVE: -follow up: 1 year for physical  We have ordered a pap smear at this visit. It can take up to 1-2 weeks for results and processing. IF results require follow up or explanation, we will call you with instructions. Clinically stable results will be released to your [facility]. If you have not heard from [place] or cannot find your results in [facility] in 2 weeks please contact our office at [phone].  If you are not yet signed up for [facility], please consider signing up.   Health Maintenance for Postmenopausal Women Menopause is a normal process in which your reproductive ability comes to an end. This process happens gradually over a span of months to years, usually between the ages of 81 and 95. Menopause is complete when you have missed 12 consecutive menstrual periods. It  is important to talk with your health care provider about some of the most common conditions that affect postmenopausal women, such as heart disease, cancer, and bone loss (osteoporosis). Adopting a healthy lifestyle and getting preventive care can help to promote your health and wellness. Those actions can also lower your chances of developing some of these common conditions. What should I know about menopause? During menopause, you may experience a number of symptoms, such as:  Moderate-to-severe hot flashes.  Night sweats.  Decrease in sex drive.  Mood swings.  Headaches.  Tiredness.  Irritability.  Memory problems.  Insomnia.  Choosing to treat or not to treat menopausal changes is an individual decision that you make with your health care provider. What should I know about hormone replacement therapy and supplements? Hormone therapy products are effective for treating symptoms that are associated with menopause, such as hot flashes and night sweats. Hormone  replacement carries certain risks, especially as you become older. If you are thinking about using estrogen or estrogen with progestin treatments, discuss the benefits and risks with your health care provider. What should I know about heart disease and stroke? Heart disease, heart attack, and stroke become more likely as you age. This may be due, in part, to the hormonal changes that your body experiences during menopause. These can affect how your body processes dietary fats, triglycerides, and cholesterol. Heart attack and stroke are both medical emergencies. There are many things that you can do to help prevent heart disease and stroke:  Have your blood pressure checked at least every 1-2 years. High blood pressure causes heart disease and increases the risk of stroke.  If you are 40-31 years old, ask your health care provider if you should take aspirin to prevent a heart attack or a stroke.  Do not use any tobacco products, including cigarettes, chewing tobacco, or electronic cigarettes. If you need help quitting, ask your health care provider.  It is important to eat a healthy diet and maintain a healthy weight. ? Be sure to include plenty of vegetables, fruits, low-fat dairy products, and lean protein. ? Avoid eating foods that are high in solid fats, added sugars, or salt (sodium).  Get regular exercise. This is one of the most important things that you can do for your health. ? Try to exercise for at least 150 minutes each week. The type of exercise that you do should increase your heart rate and make you sweat. This is known as moderate-intensity exercise. ? Try to do strengthening exercises at least twice each week. Do these in addition to the moderate-intensity exercise.  Know your numbers.Ask your health care provider to check your cholesterol and your blood glucose. Continue to have your blood tested as directed by your health care provider.  What should I know about cancer  screening? There are several types of cancer. Take the following steps to reduce your risk and to catch any cancer development as early as possible. Breast Cancer  Practice breast self-awareness. ? This means understanding how your breasts normally appear and feel. ? It also means doing regular breast self-exams. Let your health care provider know about any changes, no matter how small.  If you are 53 or older, have a clinician do a breast exam (clinical breast exam or CBE) every year. Depending on your age, family history, and medical history, it may be recommended that you also have a yearly breast X-ray (mammogram).  If you have a family history of breast cancer, talk  with your health care provider about genetic screening.  If you are at high risk for breast cancer, talk with your health care provider about having an MRI and a mammogram every year.  Breast cancer (BRCA) gene test is recommended for women who have family members with BRCA-related cancers. Results of the assessment will determine the need for genetic counseling and BRCA1 and for BRCA2 testing. BRCA-related cancers include these types: ? Breast. This occurs in males or females. ? Ovarian. ? Tubal. This may also be called fallopian tube cancer. ? Cancer of the abdominal or pelvic lining (peritoneal cancer). ? Prostate. ? Pancreatic.  Cervical, Uterine, and Ovarian Cancer Your health care provider may recommend that you be screened regularly for cancer of the pelvic organs. These include your ovaries, uterus, and vagina. This screening involves a pelvic exam, which includes checking for microscopic changes to the surface of your cervix (Pap test).  For women ages 21-65, health care providers may recommend a pelvic exam and a Pap test every three years. For women ages 86-65, they may recommend the Pap test and pelvic exam, combined with testing for human papilloma virus (HPV), every five years. Some types of HPV increase your  risk of cervical cancer. Testing for HPV may also be done on women of any age who have unclear Pap test results.  Other health care providers may not recommend any screening for nonpregnant women who are considered low risk for pelvic cancer and have no symptoms. Ask your health care provider if a screening pelvic exam is right for you.  If you have had past treatment for cervical cancer or a condition that could lead to cancer, you need Pap tests and screening for cancer for at least 20 years after your treatment. If Pap tests have been discontinued for you, your risk factors (such as having a new sexual partner) need to be reassessed to determine if you should start having screenings again. Some women have medical problems that increase the chance of getting cervical cancer. In these cases, your health care provider may recommend that you have screening and Pap tests more often.  If you have a family history of uterine cancer or ovarian cancer, talk with your health care provider about genetic screening.  If you have vaginal bleeding after reaching menopause, tell your health care provider.  There are currently no reliable tests available to screen for ovarian cancer.  Lung Cancer Lung cancer screening is recommended for adults 17-79 years old who are at high risk for lung cancer because of a history of smoking. A yearly low-dose CT scan of the lungs is recommended if you:  Currently smoke.  Have a history of at least 30 pack-years of smoking and you currently smoke or have quit within the past 15 years. A pack-year is smoking an average of one pack of cigarettes per day for one year.  Yearly screening should:  Continue until it has been 15 years since you quit.  Stop if you develop a health problem that would prevent you from having lung cancer treatment.  Colorectal Cancer  This type of cancer can be detected and can often be prevented.  Routine colorectal cancer screening usually  begins at age 21 and continues through age 42.  If you have risk factors for colon cancer, your health care provider may recommend that you be screened at an earlier age.  If you have a family history of colorectal cancer, talk with your health care provider about genetic screening.  Your health care provider may also recommend using home test kits to check for hidden blood in your stool.  A small camera at the end of a tube can be used to examine your colon directly (sigmoidoscopy or colonoscopy). This is done to check for the earliest forms of colorectal cancer.  Direct examination of the colon should be repeated every 5-10 years until age 55. However, if early forms of precancerous polyps or small growths are found or if you have a family history or genetic risk for colorectal cancer, you may need to be screened more often.  Skin Cancer  Check your skin from head to toe regularly.  Monitor any moles. Be sure to tell your health care provider: ? About any new moles or changes in moles, especially if there is a change in a mole's shape or color. ? If you have a mole that is larger than the size of a pencil eraser.  If any of your family members has a history of skin cancer, especially at a young age, talk with your health care provider about genetic screening.  Always use sunscreen. Apply sunscreen liberally and repeatedly throughout the day.  Whenever you are outside, protect yourself by wearing long sleeves, pants, a wide-brimmed hat, and sunglasses.  What should I know about osteoporosis? Osteoporosis is a condition in which bone destruction happens more quickly than new bone creation. After menopause, you may be at an increased risk for osteoporosis. To help prevent osteoporosis or the bone fractures that can happen because of osteoporosis, the following is recommended:  If you are 10-66 years old, get at least 1,000 mg of calcium and at least 600 mg of vitamin D per day.  If you  are older than age 64 but younger than age 96, get at least 1,200 mg of calcium and at least 600 mg of vitamin D per day.  If you are older than age 3, get at least 1,200 mg of calcium and at least 800 mg of vitamin D per day.  Smoking and excessive alcohol intake increase the risk of osteoporosis. Eat foods that are rich in calcium and vitamin D, and do weight-bearing exercises several times each week as directed by your health care provider. What should I know about how menopause affects my mental health? Depression may occur at any age, but it is more common as you become older. Common symptoms of depression include:  Low or sad mood.  Changes in sleep patterns.  Changes in appetite or eating patterns.  Feeling an overall lack of motivation or enjoyment of activities that you previously enjoyed.  Frequent crying spells.  Talk with your health care provider if you think that you are experiencing depression. What should I know about immunizations? It is important that you get and maintain your immunizations. These include:  Tetanus, diphtheria, and pertussis (Tdap) booster vaccine.  Influenza every year before the flu season begins.  Pneumonia vaccine.  Shingles vaccine.  Your health care provider may also recommend other immunizations. This information is not intended to replace advice given to you by your health care provider. Make sure you discuss any questions you have with your health care provider. Document Released: 12/05/2005 Document Revised: 05/02/2016 Document Reviewed: 07/17/2015 Elsevier Interactive Patient Education  2018 [employer].            No Follow-up on file.  Colin Benton R., DO

## 2017-10-29 ENCOUNTER — Encounter: Payer: Self-pay | Admitting: Family Medicine

## 2017-10-29 ENCOUNTER — Ambulatory Visit (INDEPENDENT_AMBULATORY_CARE_PROVIDER_SITE_OTHER): Payer: 59 | Admitting: Family Medicine

## 2017-10-29 ENCOUNTER — Other Ambulatory Visit (HOSPITAL_COMMUNITY)
Admission: RE | Admit: 2017-10-29 | Discharge: 2017-10-29 | Disposition: A | Discharge status: Home / Self Care | Payer: 59 | Source: Ambulatory Visit | Attending: Family Medicine | Admitting: Family Medicine

## 2017-10-29 VITALS — BP 96/76 | HR 58 | Temp 97.6°F | Ht 65.0 in | Wt 160.6 lb

## 2017-10-29 DIAGNOSIS — Z124 Encounter for screening for malignant neoplasm of cervix: Secondary | ICD-10-CM | POA: Diagnosis not present

## 2017-10-29 DIAGNOSIS — Z0000 Encounter for general adult medical examination without abnormal findings: Secondary | ICD-10-CM

## 2017-10-29 NOTE — Patient Instructions (Signed)
BEFORE YOU LEAVE: -follow up: 1 year for physical  We have ordered a pap smear at this visit. It can take up to 1-2 weeks for results and processing. IF results require follow up or explanation, we will call you with instructions. Clinically stable results will be released to your [facility]. If you have not heard from [place] or cannot find your results in [facility] in 2 weeks please contact our office at [phone].  If you are not yet signed up for [facility], please consider signing up.   Health Maintenance for Postmenopausal Women Menopause is a normal process in which your reproductive ability comes to an end. This process happens gradually over a span of months to years, usually between the ages of 19 and 29. Menopause is complete when you have missed 12 consecutive menstrual periods. It is important to talk with your health care provider about some of the most common conditions that affect postmenopausal women, such as heart disease, cancer, and bone loss (osteoporosis). Adopting a healthy lifestyle and getting preventive care can help to promote your health and wellness. Those actions can also lower your chances of developing some of these common conditions. What should I know about menopause? During menopause, you may experience a number of symptoms, such as:  Moderate-to-severe hot flashes.  Night sweats.  Decrease in sex drive.  Mood swings.  Headaches.  Tiredness.  Irritability.  Memory problems.  Insomnia.  Choosing to treat or not to treat menopausal changes is an individual decision that you make with your health care provider. What should I know about hormone replacement therapy and supplements? Hormone therapy products are effective for treating symptoms that are associated with menopause, such as hot flashes and night sweats. Hormone replacement carries certain risks, especially as you become older. If you are thinking about using estrogen or estrogen with progestin  treatments, discuss the benefits and risks with your health care provider. What should I know about heart disease and stroke? Heart disease, heart attack, and stroke become more likely as you age. This may be due, in part, to the hormonal changes that your body experiences during menopause. These can affect how your body processes dietary fats, triglycerides, and cholesterol. Heart attack and stroke are both medical emergencies. There are many things that you can do to help prevent heart disease and stroke:  Have your blood pressure checked at least every 1-2 years. High blood pressure causes heart disease and increases the risk of stroke.  If you are 46-44 years old, ask your health care provider if you should take aspirin to prevent a heart attack or a stroke.  Do not use any tobacco products, including cigarettes, chewing tobacco, or electronic cigarettes. If you need help quitting, ask your health care provider.  It is important to eat a healthy diet and maintain a healthy weight. ? Be sure to include plenty of vegetables, fruits, low-fat dairy products, and lean protein. ? Avoid eating foods that are high in solid fats, added sugars, or salt (sodium).  Get regular exercise. This is one of the most important things that you can do for your health. ? Try to exercise for at least 150 minutes each week. The type of exercise that you do should increase your heart rate and make you sweat. This is known as moderate-intensity exercise. ? Try to do strengthening exercises at least twice each week. Do these in addition to the moderate-intensity exercise.  Know your numbers.Ask your health care provider to check your cholesterol and  your blood glucose. Continue to have your blood tested as directed by your health care provider.  What should I know about cancer screening? There are several types of cancer. Take the following steps to reduce your risk and to catch any cancer development as early as  possible. Breast Cancer  Practice breast self-awareness. ? This means understanding how your breasts normally appear and feel. ? It also means doing regular breast self-exams. Let your health care provider know about any changes, no matter how small.  If you are 108 or older, have a clinician do a breast exam (clinical breast exam or CBE) every year. Depending on your age, family history, and medical history, it may be recommended that you also have a yearly breast X-ray (mammogram).  If you have a family history of breast cancer, talk with your health care provider about genetic screening.  If you are at high risk for breast cancer, talk with your health care provider about having an MRI and a mammogram every year.  Breast cancer (BRCA) gene test is recommended for women who have family members with BRCA-related cancers. Results of the assessment will determine the need for genetic counseling and BRCA1 and for BRCA2 testing. BRCA-related cancers include these types: ? Breast. This occurs in males or females. ? Ovarian. ? Tubal. This may also be called fallopian tube cancer. ? Cancer of the abdominal or pelvic lining (peritoneal cancer). ? Prostate. ? Pancreatic.  Cervical, Uterine, and Ovarian Cancer Your health care provider may recommend that you be screened regularly for cancer of the pelvic organs. These include your ovaries, uterus, and vagina. This screening involves a pelvic exam, which includes checking for microscopic changes to the surface of your cervix (Pap test).  For women ages 21-65, health care providers may recommend a pelvic exam and a Pap test every three years. For women ages 52-65, they may recommend the Pap test and pelvic exam, combined with testing for human papilloma virus (HPV), every five years. Some types of HPV increase your risk of cervical cancer. Testing for HPV may also be done on women of any age who have unclear Pap test results.  Other health care  providers may not recommend any screening for nonpregnant women who are considered low risk for pelvic cancer and have no symptoms. Ask your health care provider if a screening pelvic exam is right for you.  If you have had past treatment for cervical cancer or a condition that could lead to cancer, you need Pap tests and screening for cancer for at least 20 years after your treatment. If Pap tests have been discontinued for you, your risk factors (such as having a new sexual partner) need to be reassessed to determine if you should start having screenings again. Some women have medical problems that increase the chance of getting cervical cancer. In these cases, your health care provider may recommend that you have screening and Pap tests more often.  If you have a family history of uterine cancer or ovarian cancer, talk with your health care provider about genetic screening.  If you have vaginal bleeding after reaching menopause, tell your health care provider.  There are currently no reliable tests available to screen for ovarian cancer.  Lung Cancer Lung cancer screening is recommended for adults 34-65 years old who are at high risk for lung cancer because of a history of smoking. A yearly low-dose CT scan of the lungs is recommended if you:  Currently smoke.  Have a history  of at least 30 pack-years of smoking and you currently smoke or have quit within the past 15 years. A pack-year is smoking an average of one pack of cigarettes per day for one year.  Yearly screening should:  Continue until it has been 15 years since you quit.  Stop if you develop a health problem that would prevent you from having lung cancer treatment.  Colorectal Cancer  This type of cancer can be detected and can often be prevented.  Routine colorectal cancer screening usually begins at age 72 and continues through age 32.  If you have risk factors for colon cancer, your health care provider may recommend  that you be screened at an earlier age.  If you have a family history of colorectal cancer, talk with your health care provider about genetic screening.  Your health care provider may also recommend using home test kits to check for hidden blood in your stool.  A small camera at the end of a tube can be used to examine your colon directly (sigmoidoscopy or colonoscopy). This is done to check for the earliest forms of colorectal cancer.  Direct examination of the colon should be repeated every 5-10 years until age 64. However, if early forms of precancerous polyps or small growths are found or if you have a family history or genetic risk for colorectal cancer, you may need to be screened more often.  Skin Cancer  Check your skin from head to toe regularly.  Monitor any moles. Be sure to tell your health care provider: ? About any new moles or changes in moles, especially if there is a change in a mole's shape or color. ? If you have a mole that is larger than the size of a pencil eraser.  If any of your family members has a history of skin cancer, especially at a young age, talk with your health care provider about genetic screening.  Always use sunscreen. Apply sunscreen liberally and repeatedly throughout the day.  Whenever you are outside, protect yourself by wearing long sleeves, pants, a wide-brimmed hat, and sunglasses.  What should I know about osteoporosis? Osteoporosis is a condition in which bone destruction happens more quickly than new bone creation. After menopause, you may be at an increased risk for osteoporosis. To help prevent osteoporosis or the bone fractures that can happen because of osteoporosis, the following is recommended:  If you are 41-73 years old, get at least 1,000 mg of calcium and at least 600 mg of vitamin D per day.  If you are older than age 101 but younger than age 11, get at least 1,200 mg of calcium and at least 600 mg of vitamin D per day.  If you  are older than age 60, get at least 1,200 mg of calcium and at least 800 mg of vitamin D per day.  Smoking and excessive alcohol intake increase the risk of osteoporosis. Eat foods that are rich in calcium and vitamin D, and do weight-bearing exercises several times each week as directed by your health care provider. What should I know about how menopause affects my mental health? Depression may occur at any age, but it is more common as you become older. Common symptoms of depression include:  Low or sad mood.  Changes in sleep patterns.  Changes in appetite or eating patterns.  Feeling an overall lack of motivation or enjoyment of activities that you previously enjoyed.  Frequent crying spells.  Talk with your health care provider  if you think that you are experiencing depression. What should I know about immunizations? It is important that you get and maintain your immunizations. These include:  Tetanus, diphtheria, and pertussis (Tdap) booster vaccine.  Influenza every year before the flu season begins.  Pneumonia vaccine.  Shingles vaccine.  Your health care provider may also recommend other immunizations. This information is not intended to replace advice given to you by your health care provider. Make sure you discuss any questions you have with your health care provider. Document Released: 12/05/2005 Document Revised: 05/02/2016 Document Reviewed: 07/17/2015 Elsevier Interactive Patient Education  2018 [employer].

## 2017-10-29 NOTE — Addendum Note (Signed)
Addended by: Agnes Lawrence on: 10/29/2017 08:44 AM   Modules accepted: Orders

## 2017-10-30 LAB — CYTOLOGY - PAP
Diagnosis: NEGATIVE
HPV: NOT DETECTED

## 2017-11-05 ENCOUNTER — Encounter: Payer: Self-pay | Admitting: Family Medicine

## 2017-12-18 DIAGNOSIS — K760 Fatty (change of) liver, not elsewhere classified: Secondary | ICD-10-CM | POA: Diagnosis not present

## 2018-02-25 ENCOUNTER — Encounter: Payer: Self-pay | Admitting: Family Medicine

## 2018-02-25 ENCOUNTER — Ambulatory Visit (INDEPENDENT_AMBULATORY_CARE_PROVIDER_SITE_OTHER): Payer: 59 | Admitting: Family Medicine

## 2018-02-25 VITALS — BP 98/70 | HR 76 | Temp 98.5°F | Ht 65.0 in | Wt 165.8 lb

## 2018-02-25 DIAGNOSIS — J329 Chronic sinusitis, unspecified: Secondary | ICD-10-CM

## 2018-02-25 DIAGNOSIS — J310 Chronic rhinitis: Secondary | ICD-10-CM

## 2018-02-25 NOTE — Progress Notes (Signed)
HPI:  Using dictation device. Unfortunately this device frequently misinterprets words/phrases.   Acute visit for respiratory illness: -started: 2-3 days ago -symptoms:nasal congestion, sore throat, cough, laryngitis mild -denies:fever, SOB, NVD, tooth pain, sinus pain, body aches -exercised today without trouble -has tried: nothing -sick contacts/travel/risks: no reported flu, strep or tick exposure  ROS: See pertinent positives and negatives per HPI.  Past Medical History:  Diagnosis Date  . Arthritis   . Continuous leakage of urine   . Depression   . Diverticulosis   . OSA (obstructive sleep apnea) 07/22/2016  . Steatohepatitis   . Tinnitus   . Varicose veins     Past Surgical History:  Procedure Laterality Date  . arthroscopic left knee sugery    . bilateral bunionectomy    . CHOLECYSTECTOMY    . colonsocopy    . debridement and removal and lateral release of torn cartlidge in rt knee    . gum grating    . rt knee arthroscopy    . torn cartledge in rt knee      Family History  Problem Relation Age of Onset  . Diabetes Mother   . Liver disease Mother   . Diabetes Sister   . Liver disease Sister   . Hypertension Unknown   . Cancer Unknown   . Hemochromatosis Unknown   . Cancer Father        Throat  . Colon cancer Neg Hx     Social History   Socioeconomic History  . Marital status: Married    Spouse name: Not on file  . Number of children: Not on file  . Years of education: Not on file  . Highest education level: Not on file  Occupational History  . 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 Financial resource strain: Not on file  . Food insecurity:    Worry: Not on file    Inability: Not on file  . Transportation needs:    Medical: Not on file    Non-medical: Not on file  Tobacco Use  . Smoking status: Never Smoker  . Smokeless tobacco: Never Used  Substance and Sexual Activity  . Alcohol use: No  . Drug use: No  . Sexual activity:  Not on file  Lifestyle  . Physical activity:    Days per week: Not on file    Minutes per session: Not on file  . Stress: Not on file  Relationships  . Social connections:    Talks on phone: Not on file    Gets together: Not on file    Attends religious service: Not on file    Active member of club or organization: Not on file    Attends meetings of clubs or organizations: Not on file    Relationship status: Not on file  Other Topics Concern  . 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  Cholecalciferol (VITAMIN D) 1000 UNITS capsule, Take 1,000 Units by mouth 2 (two) times daily. , Disp: , Rfl:  .  Omega-3 Fatty Acids (FISH OIL PO), Take 1,000 mg by mouth daily., Disp: , Rfl:  .  Probiotic Product (ALIGN) 4 MG CAPS, Take 4 mg by mouth daily., Disp: , Rfl:  .  TURMERIC PO, Take 538 mg by mouth daily., Disp: , Rfl:  .  vitamin E 400 UNIT  capsule, Take 800 Units by mouth. , Disp: , Rfl:   Current Facility-Administered Medications:  .  0.9 %  sodium chloride infusion, 500 mL, Intravenous, Continuous, Ladene Artist, MD  EXAM:  Vitals:   02/25/18 1635  BP: 98/70  Pulse: 76  Temp: 98.5 F (36.9 C)  SpO2: 98%    Body mass index is 27.59 kg/m.  GENERAL: vitals reviewed and listed above, alert, oriented, appears well hydrated and in no acute distress  HEENT: atraumatic, conjunttiva clear, no obvious abnormalities on inspection of external nose and ears, normal appearance of ear canals and TMs, clear nasal congestion, mild post oropharyngeal erythema with PND, no tonsillar edema or exudate, no sinus TTP  NECK: no obvious masses on inspection  LUNGS: clear to auscultation bilaterally, no wheezes, rales or rhonchi, good air movement  CV: HRRR, no peripheral edema  MS: moves all extremities without noticeable abnormality  PSYCH: pleasant and cooperative,  no obvious depression or anxiety  ASSESSMENT AND PLAN:  Discussed the following assessment and plan:  Rhinosinusitis  -given HPI and exam findings today, a serious infection or illness is unlikely. We discussed potential etiologies, with VURI or allergic rinitis being most likely, and advised supportive care and monitoring. We discussed treatment side effects, likely course, antibiotic misuse, transmission, and signs of developing a serious illness. -of course, we advised to return or notify a doctor immediately if symptoms worsen or persist or new concerns arise.  Declined AVS  There are no Patient Instructions on file for this visit.  [name], DO

## 2018-03-23 ENCOUNTER — Ambulatory Visit: Payer: 59 | Admitting: Family Medicine

## 2018-03-23 ENCOUNTER — Encounter: Payer: Self-pay | Admitting: Family Medicine

## 2018-03-23 VITALS — BP 100/64 | HR 90 | Temp 98.8°F | Ht 65.0 in | Wt 164.6 lb

## 2018-03-23 DIAGNOSIS — J069 Acute upper respiratory infection, unspecified: Secondary | ICD-10-CM

## 2018-03-23 MED ORDER — BENZONATATE 100 MG PO CAPS: 100.0000 mg | ORAL_CAPSULE | Freq: Two times a day (BID) | ORAL | 0 refills | Status: DC | PRN

## 2018-03-23 MED FILL — BENZONATATE 100 MG CAPS: 100 | 10 days supply | Qty: 20 | Fill #0

## 2018-03-23 NOTE — Patient Instructions (Signed)
INSTRUCTIONS FOR UPPER RESPIRATORY INFECTION:  -plenty of rest and fluids  -nasal saline wash 2-3 times daily (use prepackaged nasal saline or bottled/distilled water if making your own)   -can use AFRIN nasal spray for drainage and nasal congestion - but do NOT use longer then 3-4 days  -can use tylenol (in no history of liver disease) or ibuprofen (if no history of kidney disease, bowel bleeding or significant heart disease) as directed for aches and sorethroat  -in the winter time, using a humidifier at night is helpful (please follow cleaning instructions)  -if you are taking a cough medication - use only as directed, may also try a teaspoon of honey to coat the throat and throat lozenges. I sent the tessalon to the pharmacy for the cough.  -for sore throat, salt water gargles can help  -follow up if you have fevers, facial pain, tooth pain, difficulty breathing or are worsening or symptoms persist longer then expected  Upper Respiratory Infection, Adult An upper respiratory infection (URI) is also known as the common cold. It is often caused by a type of germ (virus). Colds are easily spread (contagious). You can pass it to others by kissing, coughing, sneezing, or drinking out of the same glass. Usually, you get better in 1 to 3  weeks.  However, the cough can last for even longer. HOME CARE   Only take medicine as told by your doctor. Follow instructions provided above.  Drink enough water and fluids to keep your pee (urine) clear or pale yellow.  Get plenty of rest.  Return to work when your temperature is < 100 for 24 hours or as told by your doctor. You may use a face mask and wash your hands to stop your cold from spreading. GET HELP RIGHT AWAY IF:   After the first few days, you feel you are getting worse.  You have questions about your medicine.  You have chills, shortness of breath, or red spit (mucus).  You have pain in the face for more then 1-2 days, especially  when you bend forward.  You have a fever, puffy (swollen) neck, pain when you swallow, or white spots in the back of your throat.  You have a bad headache, ear pain, sinus pain, or chest pain.  You have a high-pitched whistling sound when you breathe in and out (wheezing).  You cough up blood.  You have sore muscles or a stiff neck. MAKE SURE YOU:   Understand these instructions.  Will watch your condition.  Will get help right away if you are not doing well or get worse. Document Released: 03/31/2008 Document Revised: 01/05/2012 Document Reviewed: 01/18/2014 [facility] Patient Information 2015 [place], [place]. This information is not intended to replace advice given to you by your health care provider. Make sure you discuss any questions you have with your health care provider.

## 2018-03-23 NOTE — Progress Notes (Signed)
HPI:  Using dictation device. Unfortunately this device frequently misinterprets words/phrases.   Acute visit for respiratory illness: -started:4 days ago -symptoms:nasal congestion, sore throat, cough, PND, a little muck in R eye this morning -denies:fever, SOB, NVD, tooth pain -has tried: sudafed and tylenol -sick contacts/travel/risks: no reported flu, strep or tick exposure - husband with the same, then she got it -Hx of: not sig ROS: See pertinent positives and negatives per HPI.  Past Medical History:  Diagnosis Date  . Arthritis   . Continuous leakage of urine   . Depression   . Diverticulosis   . OSA (obstructive sleep apnea) 07/22/2016  . Steatohepatitis   . Tinnitus   . Varicose veins     Past Surgical History:  Procedure Laterality Date  . arthroscopic left knee sugery    . bilateral bunionectomy    . CHOLECYSTECTOMY    . colonsocopy    . debridement and removal and lateral release of torn cartlidge in rt knee    . gum grating    . rt knee arthroscopy    . torn cartledge in rt knee      Family History  Problem Relation Age of Onset  . Diabetes Mother   . Liver disease Mother   . Diabetes Sister   . Liver disease Sister   . Hypertension Unknown   . Cancer Unknown   . Hemochromatosis Unknown   . Cancer Father        Throat  . Colon cancer Neg Hx     Social History   Socioeconomic History  . Marital status: Married    Spouse name: Not on file  . Number of children: Not on file  . Years of education: Not on file  . Highest education level: Not on file  Occupational History  . 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 Financial resource strain: Not on file  . Food insecurity:    Worry: Not on file    Inability: Not on file  . Transportation needs:    Medical: Not on file    Non-medical: Not on file  Tobacco Use  . Smoking status: Never Smoker  . Smokeless tobacco: Never Used  Substance and Sexual Activity  . Alcohol use: No  .  Drug use: No  . Sexual activity: Not on file  Lifestyle  . Physical activity:    Days per week: Not on file    Minutes per session: Not on file  . Stress: Not on file  Relationships  . Social connections:    Talks on phone: Not on file    Gets together: Not on file    Attends religious service: Not on file    Active member of club or organization: Not on file    Attends meetings of clubs or organizations: Not on file    Relationship status: Not on file  Other Topics Concern  . 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  Cholecalciferol (VITAMIN D) 1000 UNITS capsule, Take 1,000 Units by mouth 2 (two) times daily. , Disp: , Rfl:  .  Omega-3 Fatty Acids (FISH OIL PO), Take 1,000 mg by mouth daily., Disp: , Rfl:  .  Probiotic Product (ALIGN) 4 MG CAPS, Take 4 mg by mouth daily., Disp: , Rfl:  .  TURMERIC PO, Take 538 mg by  mouth daily., Disp: , Rfl:  .  vitamin E 400 UNIT capsule, Take 800 Units by mouth. , Disp: , Rfl:  .  benzonatate (TESSALON) 100 MG capsule, Take 1 capsule (100 mg total) by mouth 2 (two) times daily as needed for cough., Disp: 20 capsule, Rfl: 0  Current Facility-Administered Medications:  .  0.9 %  sodium chloride infusion, 500 mL, Intravenous, Continuous, Ladene Artist, MD  EXAM:  Vitals:   03/23/18 1315  BP: 100/64  Pulse: 90  Temp: 98.8 F (37.1 C)  SpO2: 98%    Body mass index is 27.39 kg/m.  GENERAL: vitals reviewed and listed above, alert, oriented, appears well hydrated and in no acute distress  HEENT: atraumatic, conjunttiva clear, no obvious abnormalities on inspection of external nose and ears, normal appearance of ear canals and TMs, clear nasal congestion, mild post oropharyngeal erythema with PND, no tonsillar edema or exudate, no sinus TTP  NECK: no obvious masses on inspection  LUNGS: clear to auscultation  bilaterally, no wheezes, rales or rhonchi, good air movement  CV: HRRR, no peripheral edema  MS: moves all extremities without noticeable abnormality  PSYCH: pleasant and cooperative, no obvious depression or anxiety  ASSESSMENT AND PLAN:  Discussed the following assessment and plan:  Upper respiratory tract infection, unspecified type  -given HPI and exam findings today, a serious infection or illness is unlikely. We discussed potential etiologies, with VURI being most likely, and advised supportive care and monitoring. We discussed treatment side effects, likely course, antibiotic misuse, transmission, and signs of developing a serious illness. -tessalon for cough -of course, we advised to return or notify a doctor immediately if symptoms worsen or persist or new concerns arise.  Patient Instructions  INSTRUCTIONS FOR UPPER RESPIRATORY INFECTION:  -plenty of rest and fluids  -nasal saline wash 2-3 times daily (use prepackaged nasal saline or bottled/distilled water if making your own)   -can use AFRIN nasal spray for drainage and nasal congestion - but do NOT use longer then 3-4 days  -can use tylenol (in no history of liver disease) or ibuprofen (if no history of kidney disease, bowel bleeding or significant heart disease) as directed for aches and sorethroat  -in the winter time, using a humidifier at night is helpful (please follow cleaning instructions)  -if you are taking a cough medication - use only as directed, may also try a teaspoon of honey to coat the throat and throat lozenges. I sent the tessalon to the pharmacy for the cough.  -for sore throat, salt water gargles can help  -follow up if you have fevers, facial pain, tooth pain, difficulty breathing or are worsening or symptoms persist longer then expected  Upper Respiratory Infection, Adult An upper respiratory infection (URI) is also known as the common cold. It is often caused by a type of germ (virus). Colds  are easily spread (contagious). You can pass it to others by kissing, coughing, sneezing, or drinking out of the same glass. Usually, you get better in 1 to 3  weeks.  However, the cough can last for even longer. HOME CARE   Only take medicine as told by your doctor. Follow instructions provided above.  Drink enough water and fluids to keep your pee (urine) clear or pale yellow.  Get plenty of rest.  Return to work when your temperature is < 100 for 24 hours or as told by your doctor. You may use a face mask and wash your hands to stop your cold from  spreading. GET HELP RIGHT AWAY IF:   After the first few days, you feel you are getting worse.  You have questions about your medicine.  You have chills, shortness of breath, or red spit (mucus).  You have pain in the face for more then 1-2 days, especially when you bend forward.  You have a fever, puffy (swollen) neck, pain when you swallow, or white spots in the back of your throat.  You have a bad headache, ear pain, sinus pain, or chest pain.  You have a high-pitched whistling sound when you breathe in and out (wheezing).  You cough up blood.  You have sore muscles or a stiff neck. MAKE SURE YOU:   Understand these instructions.  Will watch your condition.  Will get help right away if you are not doing well or get worse. Document Released: 03/31/2008 Document Revised: 01/05/2012 Document Reviewed: 01/18/2014 [facility] Patient Information 2015 [place], [place]. This information is not intended to replace advice given to you by your health care provider. Make sure you discuss any questions you have with your health care provider.    [name], DO

## 2018-06-02 DIAGNOSIS — D2261 Melanocytic nevi of right upper limb, including shoulder: Secondary | ICD-10-CM | POA: Diagnosis not present

## 2018-06-02 DIAGNOSIS — D2371 Other benign neoplasm of skin of right lower limb, including hip: Secondary | ICD-10-CM | POA: Diagnosis not present

## 2018-06-02 DIAGNOSIS — D2361 Other benign neoplasm of skin of right upper limb, including shoulder: Secondary | ICD-10-CM | POA: Diagnosis not present

## 2018-06-02 DIAGNOSIS — L814 Other melanin hyperpigmentation: Secondary | ICD-10-CM | POA: Diagnosis not present

## 2018-06-02 DIAGNOSIS — D2271 Melanocytic nevi of right lower limb, including hip: Secondary | ICD-10-CM | POA: Diagnosis not present

## 2018-06-02 DIAGNOSIS — Z419 Encounter for procedure for purposes other than remedying health state, unspecified: Secondary | ICD-10-CM | POA: Diagnosis not present

## 2018-06-02 DIAGNOSIS — D225 Melanocytic nevi of trunk: Secondary | ICD-10-CM | POA: Diagnosis not present

## 2018-06-02 DIAGNOSIS — D235 Other benign neoplasm of skin of trunk: Secondary | ICD-10-CM | POA: Diagnosis not present

## 2018-06-02 DIAGNOSIS — D2272 Melanocytic nevi of left lower limb, including hip: Secondary | ICD-10-CM | POA: Diagnosis not present

## 2018-06-18 DIAGNOSIS — K760 Fatty (change of) liver, not elsewhere classified: Secondary | ICD-10-CM | POA: Diagnosis not present

## 2018-06-18 DIAGNOSIS — Z800 Family history of malignant neoplasm of digestive organs: Secondary | ICD-10-CM | POA: Diagnosis not present

## 2018-06-18 DIAGNOSIS — Z9049 Acquired absence of other specified parts of digestive tract: Secondary | ICD-10-CM | POA: Diagnosis not present

## 2018-07-20 ENCOUNTER — Other Ambulatory Visit: Payer: Self-pay | Admitting: Family Medicine

## 2018-07-20 DIAGNOSIS — Z1231 Encounter for screening mammogram for malignant neoplasm of breast: Secondary | ICD-10-CM

## 2018-08-23 ENCOUNTER — Ambulatory Visit
Admission: RE | Admit: 2018-08-23 | Discharge: 2018-08-23 | Disposition: A | Discharge status: Home / Self Care | Payer: 59 | Source: Ambulatory Visit | Attending: Family Medicine | Admitting: Family Medicine

## 2018-08-23 DIAGNOSIS — Z1231 Encounter for screening mammogram for malignant neoplasm of breast: Secondary | ICD-10-CM | POA: Diagnosis not present

## 2018-08-25 ENCOUNTER — Other Ambulatory Visit: Payer: Self-pay | Admitting: Family Medicine

## 2018-08-25 DIAGNOSIS — R928 Other abnormal and inconclusive findings on diagnostic imaging of breast: Secondary | ICD-10-CM

## 2018-08-30 ENCOUNTER — Ambulatory Visit
Admission: RE | Admit: 2018-08-30 | Discharge: 2018-08-30 | Disposition: A | Discharge status: Home / Self Care | Payer: 59 | Source: Ambulatory Visit | Attending: Family Medicine | Admitting: Family Medicine

## 2018-08-30 ENCOUNTER — Ambulatory Visit: Payer: 59

## 2018-08-30 DIAGNOSIS — R928 Other abnormal and inconclusive findings on diagnostic imaging of breast: Secondary | ICD-10-CM

## 2018-10-31 NOTE — Progress Notes (Signed)
HPI:  Using dictation device. Unfortunately this device frequently misinterprets words/phrases.  Here for CPE: Due for lipids, hgba1c, dexa, flu vaccine  -Concerns and/or follow up today:  Chronic medical problems summarized below were reviewed for changes.Reports doing well. Will see her hepatologist soon and does all labs except lipids and hgba1c there. Only new concern in bilateral knee pain. Started a few months ago after starting new work out at [employer]. Very active. Walks 50 miles per week and does a lot of stairs. Continues to do this. But has some intermittent moderate bilateral knee pain. No swelling, redness, locking, catching, giving away or weakness. Wants to do physical therapy with her friend [place] [facility] and wants rx for this.   Overweight/snoring/OSA: -on belviq in the past -eating healthy and exercising - lost weight and feels great -saw pulm for sleep apnea testing - using mouth piece and will be retested - feels much better  Allergic Rhinitis/Tinnitus: -uses INS seasonally -mild tinnitus chronically  NASH: -with fibrosis -sees hepatologist for this as has a family hx of hepatic cancer - monitored with labs and [place] q 6 months and endoscopy (Dr. Mosetta Pigeon with [facility]) - reports liver scan was better after wt loss -diet and exercise: paleo; regular exercise -denies: abd pain, nausea, vomiting  OOB: -for a very long at least 5 years, mild urgency -tried vesicare with prior PCP which helpedOverweight/snoring/OSA: -on belviq in the past -eating healthy and exercising - lost weight and feels great -saw pulm for sleep apnea testing - using mouth piece and will be retested - feels much better  Allergic Rhinitis/Tinnitus: -uses INS seasonally -mild tinnitus chronically  -Diet: variety of foods, balance and well rounded, larger portion sizes -Exercise: no regular exercise -Taking folic acid, vitamin D or calcium: no -Diabetes and Dyslipidemia Screening: don  ein 2017 and normal -Vaccines: see vaccine section EPIC -pap history: 10/30/17 0 normal, hpv neg -FDLMP: see nursing notes -sexual activity: not discussed -wants STI testing (Hep C if born [birth year]): no -FH breast, colon or ovarian ca: see FH Last mammogram: 08/2018 Last colon cancer screening: colonoscopy 2015 w/ Dr. Fuller Plan - reports told to repeat in 10 years Breast Ca Risk Assessment: see family history and pt history DEXA (>/= 28): osteopenic on dexa 2017, ordered repeat last year, but she did not have this done  -Alcohol, Tobacco, drug use: see social history  Review of Systems - no fevers, unintentional weight loss, vision loss, hearing loss, chest pain, sob, hemoptysis, melena, hematochezia, hematuria, genital discharge, changing or concerning skin lesions, bleeding, bruising, loc, thoughts of self harm or SI  Past Medical History:  Diagnosis Date  . Arthritis   . Continuous leakage of urine   . Depression   . Diverticulosis   . OSA (obstructive sleep apnea) 07/22/2016  . Steatohepatitis   . Tinnitus   . Varicose veins     Past Surgical History:  Procedure Laterality Date  . arthroscopic left knee sugery    . bilateral bunionectomy    . BREAST BIOPSY Left   . CHOLECYSTECTOMY    . colonsocopy    . debridement and removal and lateral release of torn cartlidge in rt knee    . gum grating    . rt knee arthroscopy    . torn cartledge in rt knee      Family History  Problem Relation Age of Onset  . Diabetes Mother   . Liver disease Mother   . Diabetes Sister   . Liver disease  Sister   . Hypertension Unknown   . Cancer Unknown   . Hemochromatosis Unknown   . Cancer Father        Throat  . Colon cancer Neg Hx   . Breast cancer Neg Hx     Social History   Socioeconomic History  . Marital status: Married    Spouse name: Not on file  . Number of children: Not on file  . Years of education: Not on file  . Highest education level: Not on file  Occupational History   . 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 Financial resource strain: Not on file  . Food insecurity:    Worry: Not on file    Inability: Not on file  . Transportation needs:    Medical: Not on file    Non-medical: Not on file  Tobacco Use  . Smoking status: Never Smoker  . Smokeless tobacco: Never Used  Substance and Sexual Activity  . Alcohol use: No  . Drug use: No  . Sexual activity: Not on file  Lifestyle  . Physical activity:    Days per week: Not on file    Minutes per session: Not on file  . Stress: Not on file  Relationships  . Social connections:    Talks on phone: Not on file    Gets together: Not on file    Attends religious service: Not on file    Active member of club or organization: Not on file    Attends meetings of clubs or organizations: Not on file    Relationship status: Not on file  Other Topics Concern  . 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  Cholecalciferol (VITAMIN D) 1000 UNITS capsule, Take 1,000 Units by mouth 2 (two) times daily. , Disp: , Rfl:  .  Omega-3 Fatty Acids (FISH OIL PO), Take 1,000 mg by mouth daily., Disp: , Rfl:  .  Probiotic Product (ALIGN) 4 MG CAPS, Take 4 mg by mouth daily., Disp: , Rfl:  .  TURMERIC PO, Take 538 mg by mouth daily., Disp: , Rfl:  .  vitamin E 400 UNIT capsule, Take 800 Units by mouth. , Disp: , Rfl:   Current Facility-Administered Medications:  .  0.9 %  sodium chloride infusion, 500 mL, Intravenous, Continuous, Ladene Artist, MD  EXAM:  Vitals:   11/01/18 0750  BP: 100/76  Pulse: 74  Temp: 98.2 F (36.8 C)    GENERAL: vitals reviewed and listed below, alert, oriented, appears well hydrated and in no acute distress  HEENT: head atraumatic, PERRLA, normal appearance of eyes, ears, nose and mouth. moist mucus membranes.  NECK: supple, no masses or  lymphadenopathy  LUNGS: clear to auscultation bilaterally, no rales, rhonchi or wheeze  CV: HRRR, no peripheral edema or cyanosis, normal pedal pulses  ABDOMEN: bowel sounds normal, soft, non tender to palpation, no masses, no rebound or guarding  GU/BREAST: declined, does self breast exams  SKIN: no rash or abnormal lesions  MS: normal gait, moves all extremities normally, normal gait, no appreciable effusions or swelling or deformity LE bilat, no sig TTP on exam today. + bilat pet crepitus, + L single leg squat, mild VM weakness bilat, neg lachman, neg, drawer testing, neg val/var stress testing bilat, neg mcmurry testing bilat, NV intact bilat  NEURO: normal gait,  speech and thought processing grossly intact, muscle tone grossly intact throughout  PSYCH: normal affect, pleasant and cooperative  ASSESSMENT AND PLAN:  Discussed the following assessment and plan:  PREVENTIVE EXAM: -Discussed and advised all [place] preventive services health task force level A and B recommendations for age, sex and risks. -Advised at least 150 minutes of exercise per week and a healthy diet -shinrix -reordered dexa -fasting labs -labs, studies and vaccines per orders this encounter - Hemoglobin A1c - Lipid panel  2. Acute pain of both knees -we discussed possible serious and likely etiologies, workup and treatment, treatment risks and return precautions - suspect pfs, possible OA -after this discussion, Jaslyn opted for PT, home exercises and Rx provided, conservative prn otc analgesic, continue walking -follow up advised in 1-2 months -of course, we advised Saya  to return or notify a doctor immediately if symptoms worsen or persist or new concerns arise.  3. Osteopenia, unspecified location - DG Bone Density; Future  4. Estrogen deficiency - DG Bone Density; Future   Patient advised to return to clinic immediately if symptoms worsen or persist or new concerns.  Patient Instructions   BEFORE YOU LEAVE: -shinrix - she wants to get this here -PFS exercises -labs -order DEXA -PHQ9 in epic -follow up: 1-2 months  We ordered your bone density test.  Do the exercises for the knees and see the physical therapist. I hope you are feeling better soon! Seek care promptly if your symptoms worsen, new concerns arise or you are not improving with treatment.  We have ordered labs or studies at this visit. It can take up to 1-2 weeks for results and processing. IF results require follow up or explanation, we will call you with instructions. Clinically stable results will be released to your [facility]. If you have not heard from [place] or cannot find your results in [facility] in 2 weeks please contact our office at [phone].  If you are not yet signed up for [facility], please consider signing up.   Preventive Care 40-64 Years, Female Preventive care refers to lifestyle choices and visits with your health care provider that can promote health and wellness. What does preventive care include?   A yearly physical exam. This is also called an annual well check.  Dental exams once or twice a year.  Routine eye exams. Ask your health care provider how often you should have your eyes checked.  Personal lifestyle choices, including: ? Daily care of your teeth and gums. ? Regular physical activity. ? Eating a healthy diet. ? Avoiding tobacco and drug use. ? Limiting alcohol use. ? Practicing safe sex. ? Taking vitamin and mineral supplements as recommended by your health care provider. What happens during an annual well check? The services and screenings done by your health care provider during your annual well check will depend on your age, overall health, lifestyle risk factors, and family history of disease. Counseling Your health care provider may ask you questions about your:  Alcohol use.  Tobacco use.  Drug use.  Emotional well-being.  Home and relationship  well-being.  Sexual activity.  Eating habits.  Work and work Statistician.  Method of birth control.  Menstrual cycle.  Pregnancy history. Screening You may have the following tests or measurements:  Height, weight, and BMI.  Blood pressure.  Lipid and cholesterol levels. These may be checked every 5 years, or more frequently if you are over 55 years old.  Skin check.  Lung cancer screening. You may have  this screening every year starting at age 34 if you have a 30-pack-year history of smoking and currently smoke or have quit within the past 15 years.  Colorectal cancer screening. All adults should have this screening starting at age 55 and continuing until age 81. Your health care provider may recommend screening at age 51. You will have tests every 1-10 years, depending on your results and the type of screening test. People at increased risk should start screening at an earlier age. Screening tests may include: ? Guaiac-based fecal occult blood testing. ? Fecal immunochemical test (FIT). ? Stool DNA test. ? Virtual colonoscopy. ? Sigmoidoscopy. During this test, a flexible tube with a tiny camera (sigmoidoscope) is used to examine your rectum and lower colon. The sigmoidoscope is inserted through your anus into your rectum and lower colon. ? Colonoscopy. During this test, a long, thin, flexible tube with a tiny camera (colonoscope) is used to examine your entire colon and rectum.  Hepatitis C blood test.  Hepatitis B blood test.  Sexually transmitted disease (STD) testing.  Diabetes screening. This is done by checking your blood sugar (glucose) after you have not eaten for a while (fasting). You may have this done every 1-3 years.  Mammogram. This may be done every 1-2 years. Talk to your health care provider about when you should start having regular mammograms. This may depend on whether you have a family history of breast cancer.  BRCA-related cancer screening. This  may be done if you have a family history of breast, ovarian, tubal, or peritoneal cancers.  Pelvic exam and Pap test. This may be done every 3 years starting at age 69. Starting at age 37, this may be done every 5 years if you have a Pap test in combination with an HPV test.  Bone density scan. This is done to screen for osteoporosis. You may have this scan if you are at high risk for osteoporosis. Discuss your test results, treatment options, and if necessary, the need for more tests with your health care provider. Vaccines Your health care provider may recommend certain vaccines, such as:  Influenza vaccine. This is recommended every year.  Tetanus, diphtheria, and acellular pertussis (Tdap, Td) vaccine. You may need a Td booster every 10 years.  Varicella vaccine. You may need this if you have not been vaccinated.  Zoster vaccine. You may need this after age 88.  Measles, mumps, and rubella (MMR) vaccine. You may need at least one dose of MMR if you were born in 1957 or later. You may also need a second dose.  Pneumococcal 13-valent conjugate (PCV13) vaccine. You may need this if you have certain conditions and were not previously vaccinated.  Pneumococcal polysaccharide (PPSV23) vaccine. You may need one or two doses if you smoke cigarettes or if you have certain conditions.  Meningococcal vaccine. You may need this if you have certain conditions.  Hepatitis A vaccine. You may need this if you have certain conditions or if you travel or work in places where you may be exposed to hepatitis A.  Hepatitis B vaccine. You may need this if you have certain conditions or if you travel or work in places where you may be exposed to hepatitis B.  Haemophilus influenzae type b (Hib) vaccine. You may need this if you have certain conditions. Talk to your health care provider about which screenings and vaccines you need and how often you need them. This information is not intended to replace  advice given to  you by your health care provider. Make sure you discuss any questions you have with your health care provider. Document Released: 11/09/2015 Document Revised: 12/03/2017 Document Reviewed: 08/14/2015 Elsevier Interactive Patient Education  2019 [employer].          No follow-ups on file.  [name], DO

## 2018-11-01 ENCOUNTER — Encounter: Payer: Self-pay | Admitting: Family Medicine

## 2018-11-01 ENCOUNTER — Ambulatory Visit (INDEPENDENT_AMBULATORY_CARE_PROVIDER_SITE_OTHER): Payer: 59 | Admitting: Family Medicine

## 2018-11-01 VITALS — BP 100/76 | HR 74 | Temp 98.2°F | Ht 65.25 in | Wt 172.3 lb

## 2018-11-01 DIAGNOSIS — Z23 Encounter for immunization: Secondary | ICD-10-CM | POA: Diagnosis not present

## 2018-11-01 DIAGNOSIS — E2839 Other primary ovarian failure: Secondary | ICD-10-CM | POA: Diagnosis not present

## 2018-11-01 DIAGNOSIS — M25562 Pain in left knee: Secondary | ICD-10-CM | POA: Diagnosis not present

## 2018-11-01 DIAGNOSIS — M8580 Other specified disorders of bone density and structure, unspecified site: Secondary | ICD-10-CM | POA: Diagnosis not present

## 2018-11-01 DIAGNOSIS — M25561 Pain in right knee: Secondary | ICD-10-CM | POA: Diagnosis not present

## 2018-11-01 DIAGNOSIS — Z0000 Encounter for general adult medical examination without abnormal findings: Secondary | ICD-10-CM | POA: Diagnosis not present

## 2018-11-01 LAB — LIPID PANEL
Cholesterol: 138 mg/dL (ref 0–200)
HDL: 64.8 mg/dL (ref >39.00)
LDL Cholesterol: 60 mg/dL (ref 0–99)
NonHDL: 73.28
Total CHOL/HDL Ratio: 2
Triglycerides: 67 mg/dL (ref 0.0–149.0)
VLDL: 13.4 mg/dL (ref 0.0–40.0)

## 2018-11-01 LAB — HEMOGLOBIN A1C: Hgb A1c MFr Bld: 4.8 % (ref 4.6–6.5)

## 2018-11-01 NOTE — Patient Instructions (Signed)
BEFORE YOU LEAVE: -shinrix - she wants to get this here -PFS exercises -labs -order DEXA -PHQ9 in epic -follow up: 1-2 months  We ordered your bone density test.  Do the exercises for the knees and see the physical therapist. I hope you are feeling better soon! Seek care promptly if your symptoms worsen, new concerns arise or you are not improving with treatment.  We have ordered labs or studies at this visit. It can take up to 1-2 weeks for results and processing. IF results require follow up or explanation, we will call you with instructions. Clinically stable results will be released to your [facility]. If you have not heard from [place] or cannot find your results in [facility] in 2 weeks please contact our office at [phone].  If you are not yet signed up for [facility], please consider signing up.   Preventive Care 40-64 Years, Female Preventive care refers to lifestyle choices and visits with your health care provider that can promote health and wellness. What does preventive care include?   A yearly physical exam. This is also called an annual well check.  Dental exams once or twice a year.  Routine eye exams. Ask your health care provider how often you should have your eyes checked.  Personal lifestyle choices, including: ? Daily care of your teeth and gums. ? Regular physical activity. ? Eating a healthy diet. ? Avoiding tobacco and drug use. ? Limiting alcohol use. ? Practicing safe sex. ? Taking vitamin and mineral supplements as recommended by your health care provider. What happens during an annual well check? The services and screenings done by your health care provider during your annual well check will depend on your age, overall health, lifestyle risk factors, and family history of disease. Counseling Your health care provider may ask you questions about your:  Alcohol use.  Tobacco use.  Drug use.  Emotional well-being.  Home and relationship  well-being.  Sexual activity.  Eating habits.  Work and work Statistician.  Method of birth control.  Menstrual cycle.  Pregnancy history. Screening You may have the following tests or measurements:  Height, weight, and BMI.  Blood pressure.  Lipid and cholesterol levels. These may be checked every 5 years, or more frequently if you are over 4 years old.  Skin check.  Lung cancer screening. You may have this screening every year starting at age 16 if you have a 30-pack-year history of smoking and currently smoke or have quit within the past 15 years.  Colorectal cancer screening. All adults should have this screening starting at age 40 and continuing until age 41. Your health care provider may recommend screening at age 80. You will have tests every 1-10 years, depending on your results and the type of screening test. People at increased risk should start screening at an earlier age. Screening tests may include: ? Guaiac-based fecal occult blood testing. ? Fecal immunochemical test (FIT). ? Stool DNA test. ? Virtual colonoscopy. ? Sigmoidoscopy. During this test, a flexible tube with a tiny camera (sigmoidoscope) is used to examine your rectum and lower colon. The sigmoidoscope is inserted through your anus into your rectum and lower colon. ? Colonoscopy. During this test, a long, thin, flexible tube with a tiny camera (colonoscope) is used to examine your entire colon and rectum.  Hepatitis C blood test.  Hepatitis B blood test.  Sexually transmitted disease (STD) testing.  Diabetes screening. This is done by checking your blood sugar (glucose) after you have not eaten  for a while (fasting). You may have this done every 1-3 years.  Mammogram. This may be done every 1-2 years. Talk to your health care provider about when you should start having regular mammograms. This may depend on whether you have a family history of breast cancer.  BRCA-related cancer screening. This  may be done if you have a family history of breast, ovarian, tubal, or peritoneal cancers.  Pelvic exam and Pap test. This may be done every 3 years starting at age 52. Starting at age 82, this may be done every 5 years if you have a Pap test in combination with an HPV test.  Bone density scan. This is done to screen for osteoporosis. You may have this scan if you are at high risk for osteoporosis. Discuss your test results, treatment options, and if necessary, the need for more tests with your health care provider. Vaccines Your health care provider may recommend certain vaccines, such as:  Influenza vaccine. This is recommended every year.  Tetanus, diphtheria, and acellular pertussis (Tdap, Td) vaccine. You may need a Td booster every 10 years.  Varicella vaccine. You may need this if you have not been vaccinated.  Zoster vaccine. You may need this after age 60.  Measles, mumps, and rubella (MMR) vaccine. You may need at least one dose of MMR if you were born in 1957 or later. You may also need a second dose.  Pneumococcal 13-valent conjugate (PCV13) vaccine. You may need this if you have certain conditions and were not previously vaccinated.  Pneumococcal polysaccharide (PPSV23) vaccine. You may need one or two doses if you smoke cigarettes or if you have certain conditions.  Meningococcal vaccine. You may need this if you have certain conditions.  Hepatitis A vaccine. You may need this if you have certain conditions or if you travel or work in places where you may be exposed to hepatitis A.  Hepatitis B vaccine. You may need this if you have certain conditions or if you travel or work in places where you may be exposed to hepatitis B.  Haemophilus influenzae type b (Hib) vaccine. You may need this if you have certain conditions. Talk to your health care provider about which screenings and vaccines you need and how often you need them. This information is not intended to replace  advice given to you by your health care provider. Make sure you discuss any questions you have with your health care provider. Document Released: 11/09/2015 Document Revised: 12/03/2017 Document Reviewed: 08/14/2015 Elsevier Interactive Patient Education  2019 [employer].

## 2018-11-05 ENCOUNTER — Ambulatory Visit (INDEPENDENT_AMBULATORY_CARE_PROVIDER_SITE_OTHER)
Admission: RE | Admit: 2018-11-05 | Discharge: 2018-11-05 | Disposition: A | Discharge status: Home / Self Care | Payer: 59 | Source: Ambulatory Visit | Attending: Family Medicine | Admitting: Family Medicine

## 2018-11-05 DIAGNOSIS — M8580 Other specified disorders of bone density and structure, unspecified site: Secondary | ICD-10-CM

## 2018-11-05 DIAGNOSIS — E2839 Other primary ovarian failure: Secondary | ICD-10-CM

## 2018-11-09 ENCOUNTER — Encounter: Payer: Self-pay | Admitting: Family Medicine

## 2018-11-09 DIAGNOSIS — M8580 Other specified disorders of bone density and structure, unspecified site: Secondary | ICD-10-CM | POA: Insufficient documentation

## 2018-12-24 DIAGNOSIS — K7689 Other specified diseases of liver: Secondary | ICD-10-CM | POA: Diagnosis not present

## 2018-12-24 DIAGNOSIS — K760 Fatty (change of) liver, not elsewhere classified: Secondary | ICD-10-CM | POA: Diagnosis not present

## 2018-12-31 ENCOUNTER — Encounter: Payer: Self-pay | Admitting: Family Medicine

## 2019-01-04 ENCOUNTER — Ambulatory Visit: Payer: 59 | Admitting: Family Medicine

## 2019-02-24 IMAGING — MG DIGITAL SCREENING BILATERAL MAMMOGRAM WITH TOMO AND CAD
8 series · 8 of 24 positions shown · non-contrast
Comparison: Previous exam(s).

CLINICAL DATA: Screening.

EXAM:
DIGITAL SCREENING BILATERAL MAMMOGRAM WITH TOMO AND CAD

[L CC synth-2D]
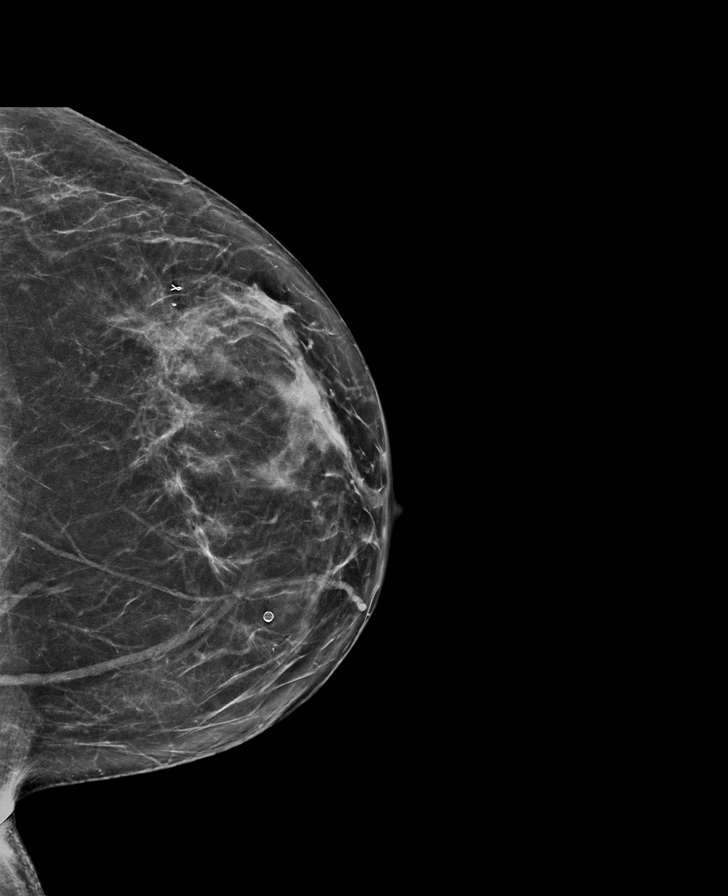

[R CC synth-2D]
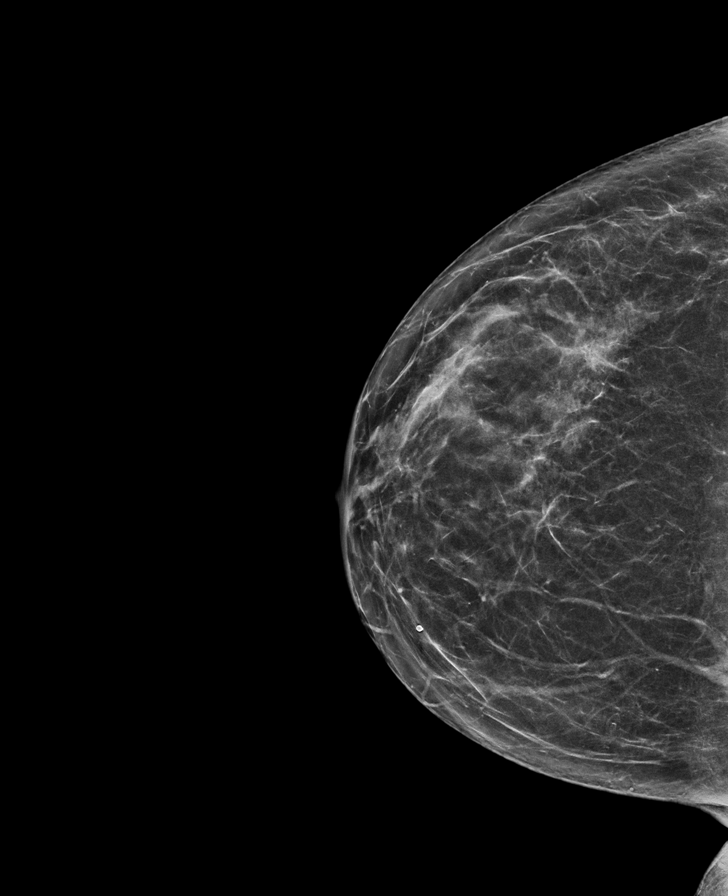

[R MLO synth-2D]
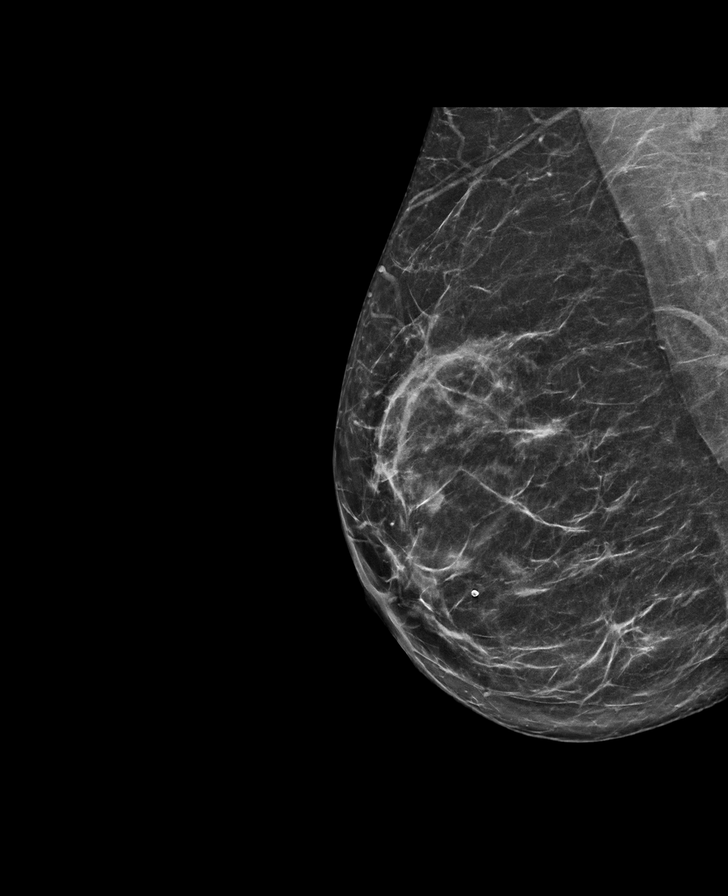

[L MLO synth-2D]
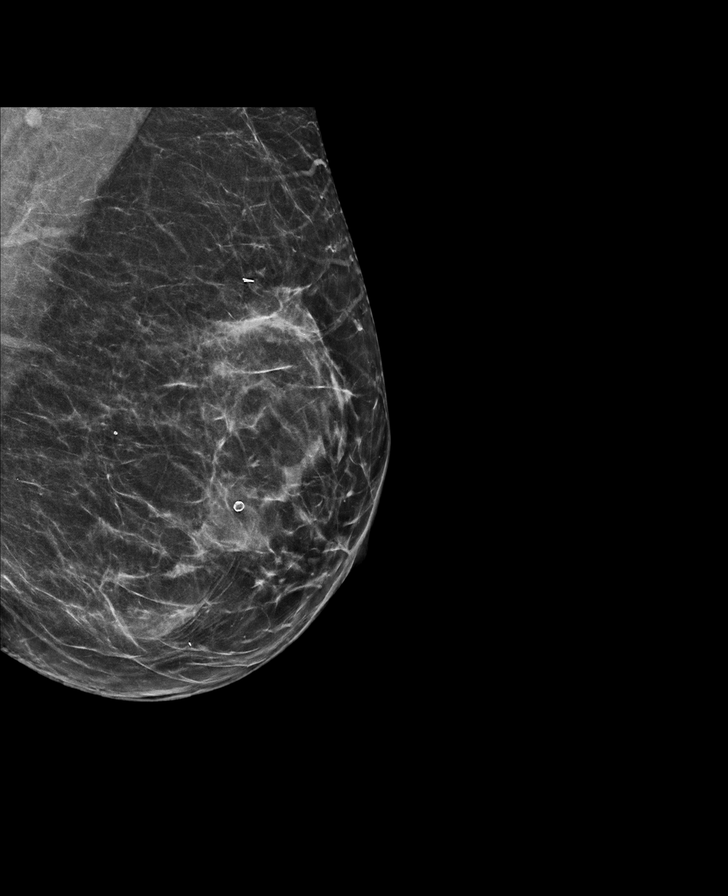

[L MLO tomo · tomo slice 37/73.0]
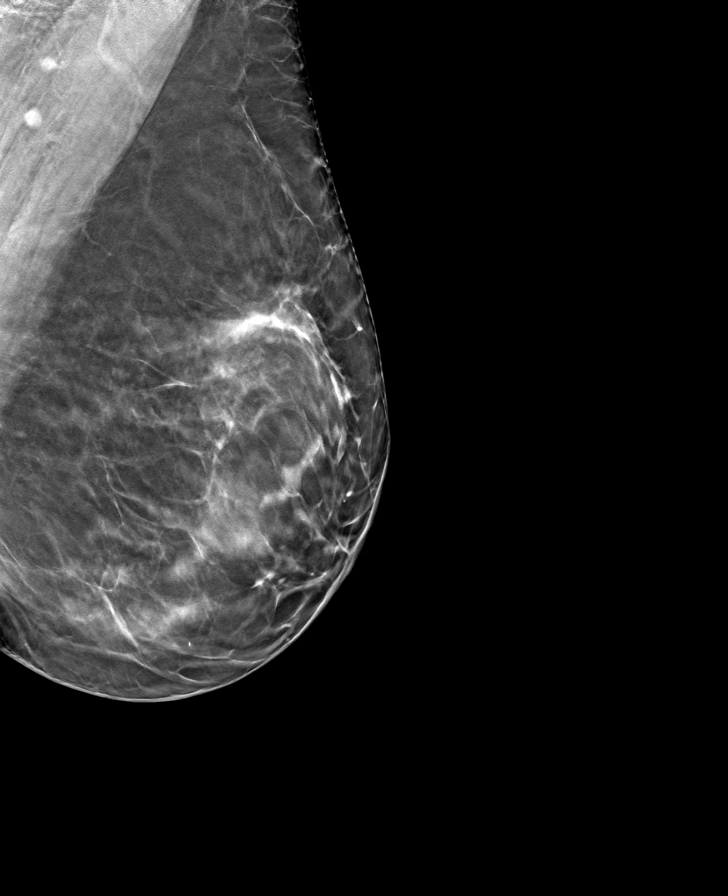

[R MLO tomo · tomo slice 37/72.0]
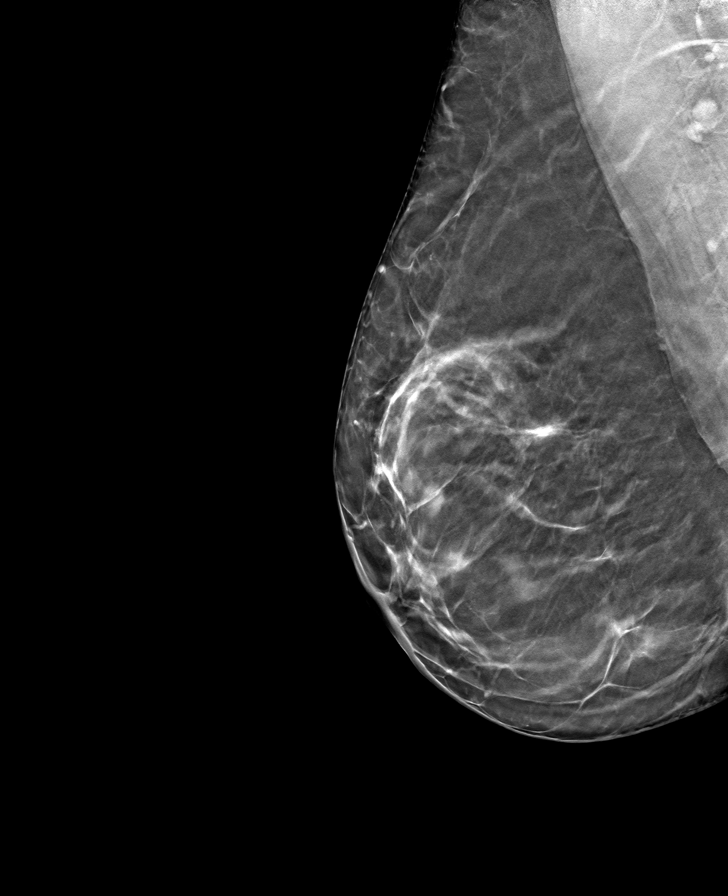

[L CC tomo · tomo slice 37/72.0]
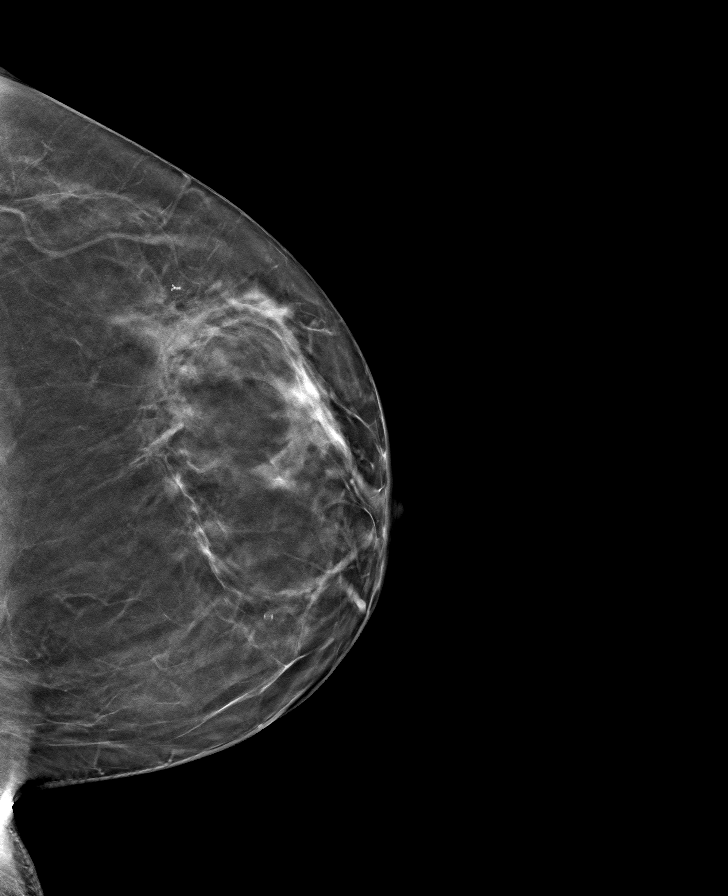

[R CC tomo · tomo slice 33/66.0]
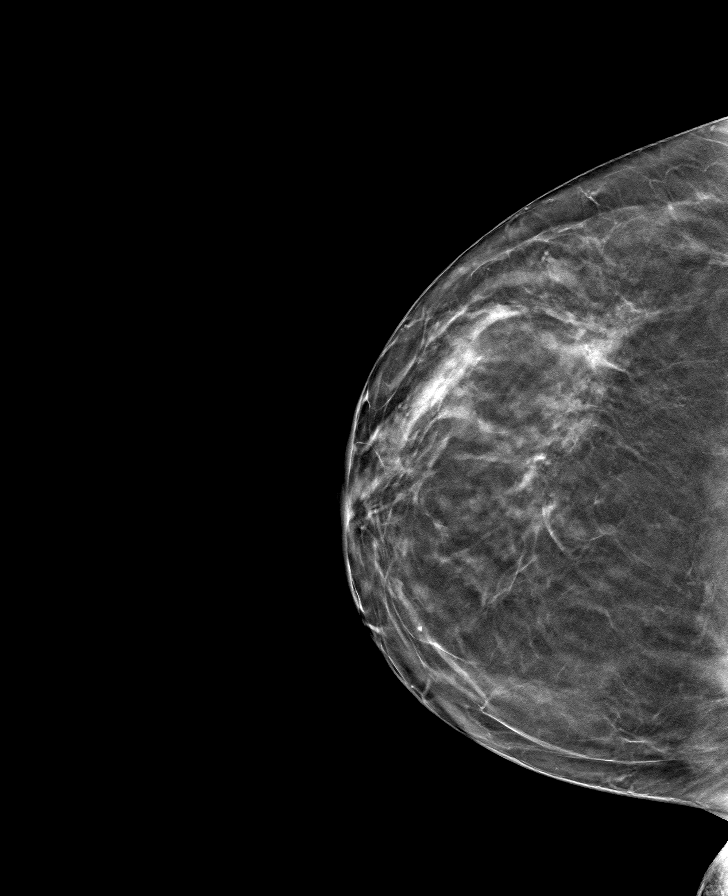

[8 of 24 positions shown; findings below may reference images not displayed]

ACR Breast Density Category c: The breast tissue is heterogeneously
dense, which may obscure small masses.
FINDINGS: In the left breast, possible distortion warrants further evaluation.
In the right breast, no findings suspicious for malignancy. Images
were processed with CAD.
IMPRESSION: Further evaluation is suggested for possible distortion in the left
breast.

RECOMMENDATION:
Diagnostic mammogram and possibly ultrasound of the left breast.
(Code:[id number]-G-HHO)

The patient will be contacted regarding the findings, and additional
imaging will be scheduled.

BI-RADS CATEGORY  0: Incomplete. Need additional imaging evaluation
and/or prior mammograms for comparison.

## 2019-03-14 ENCOUNTER — Ambulatory Visit (INDEPENDENT_AMBULATORY_CARE_PROVIDER_SITE_OTHER): Payer: 59 | Admitting: Family Medicine

## 2019-03-14 ENCOUNTER — Telehealth: Payer: Self-pay | Admitting: *Deleted

## 2019-03-14 ENCOUNTER — Other Ambulatory Visit: Payer: Self-pay

## 2019-03-14 ENCOUNTER — Encounter: Payer: Self-pay | Admitting: Family Medicine

## 2019-03-14 DIAGNOSIS — M8588 Other specified disorders of bone density and structure, other site: Secondary | ICD-10-CM | POA: Diagnosis not present

## 2019-03-14 DIAGNOSIS — K7581 Nonalcoholic steatohepatitis (NASH): Secondary | ICD-10-CM

## 2019-03-14 DIAGNOSIS — G4733 Obstructive sleep apnea (adult) (pediatric): Secondary | ICD-10-CM | POA: Diagnosis not present

## 2019-03-14 NOTE — Assessment & Plan Note (Signed)
Probably has been stable. Continue vitamin D 400 IU daily. Continue following with Dr. Edison Nasuti.

## 2019-03-14 NOTE — Telephone Encounter (Signed)
Copied from [place] [id number]. Topic: General - Other >> Mar 14, 2019  7:47 AM Carolyn Stare wrote:  Pt received her letter about Dr Maudie Mercury and would like to set up a TOC

## 2019-03-14 NOTE — Progress Notes (Signed)
Virtual Visit via Video Note   I connected with Terry Bell on 03/14/19 at  3:30 PM EDT by a video enabled telemedicine application and verified that I am speaking with the correct person using two identifiers.  Location patient: home Location provider:work or home office Persons participating in the virtual visit: patient, provider  I discussed the limitations of evaluation and management by telemedicine and the availability of in person appointments. The patient expressed understanding and agreed to proceed.   HPI: Terry Bell is a 63 yo female with past Hx of depression,OA,NASH,OSA,and osteopenia among some;who is establishing care. Former PCP: Dr Maudie Mercury. Last CPE 11/01/2018.  NASH:She follows with Dr Lawrence Marseilles. She is reporting improvement of liver numbers. Currently she is on Vit E 400 IU bid.  Denies abdominal pain, nausea, vomiting, changes in bowel habits, blood in stool or melena.  Lab Results  Component Value Date   ALT 43 (H) 09/26/2014   AST 29 09/26/2014   ALKPHOS 54 09/26/2014   BILITOT 0.8 09/26/2014   Depression is not longer as active problem.She took Wellbutrin a few years ago. Denies suicidal thoughts.  OSA,she follows with orthodontist.Treated with oral devise,which has helped. She feels rested.   Osteopenia, last DEXA on 11/15/2018 lumbar spine osteopenia with FRAX score 6.5 and 0.2% for major osteoporotic fracture and hip fracture respectively. She is currently on vitamin D supplementation 1000 units daily and recently added calcium 1200 mg daily.  She has no new concerns today.  ROS: See pertinent positives and negatives per HPI.  Past Medical History:  Diagnosis Date  . Arthritis   . Continuous leakage of urine   . Depression   . Diverticulosis   . OSA (obstructive sleep apnea) 07/22/2016  . Steatohepatitis   . Tinnitus   . Varicose veins     Past Surgical History:  Procedure Laterality Date  . arthroscopic left knee sugery    . bilateral  bunionectomy    . BREAST BIOPSY Left   . CHOLECYSTECTOMY    . colonsocopy    . debridement and removal and lateral release of torn cartlidge in rt knee    . gum grating    . rt knee arthroscopy    . torn cartledge in rt knee      Family History  Problem Relation Age of Onset  . Diabetes Mother   . Liver disease Mother   . Diabetes Sister   . Liver disease Sister   . Hypertension Unknown   . Cancer Unknown   . Hemochromatosis Unknown   . Cancer Father        Throat  . Colon cancer Neg Hx   . Breast cancer Neg Hx     Social History   Socioeconomic History  . Marital status: Married    Spouse name: Not on file  . Number of children: Not on file  . Years of education: Not on file  . Highest education level: Not on file  Occupational History  . 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 Financial resource strain: Not on file  . Food insecurity:    Worry: Not on file    Inability: Not on file  . Transportation needs:    Medical: Not on file    Non-medical: Not on file  Tobacco Use  . Smoking status: Never Smoker  . Smokeless tobacco: Never Used  Substance and Sexual Activity  . Alcohol use: No  . Drug use: No  . Sexual activity: Not on  file  Lifestyle  . Physical activity:    Days per week: Not on file    Minutes per session: Not on file  . Stress: Not on file  Relationships  . Social connections:    Talks on phone: Not on file    Gets together: Not on file    Attends religious service: Not on file    Active member of club or organization: Not on file    Attends meetings of clubs or organizations: Not on file    Relationship status: Not on file  . Intimate partner violence:    Fear of current or ex partner: Not on file    Emotionally abused: Not on file    Physically abused: Not on file    Forced sexual activity: Not on file  Other Topics Concern  . 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  Cholecalciferol (VITAMIN D) 1000 UNITS capsule, Take 1,000 Units by mouth 2 (two) times daily. , Disp: , Rfl:  .  Omega-3 Fatty Acids (FISH OIL PO), Take 1,000 mg by mouth daily., Disp: , Rfl:  .  Probiotic Product (ALIGN) 4 MG CAPS, Take 4 mg by mouth daily., Disp: , Rfl:  .  TURMERIC PO, Take 538 mg by mouth daily., Disp: , Rfl:  .  vitamin E 400 UNIT capsule, Take 800 Units by mouth. , Disp: , Rfl:   Current Facility-Administered Medications:  .  0.9 %  sodium chloride infusion, 500 mL, Intravenous, Continuous, Fuller Plan, Pricilla Riffle, MD  EXAM:  VITALS per patient if applicable:N/A  GENERAL: alert, oriented, appears well and in no acute distress  HEENT: atraumatic, conjunttiva clear, no obvious abnormalities on inspection of external nose and ears  NECK: normal movements of the head and neck  LUNGS: on inspection no signs of respiratory distress, breathing rate appears normal, no obvious gross SOB, gasping or wheezing  CV: no obvious cyanosis  Terry: moves all visible extremities without noticeable abnormality  PSYCH/NEURO: pleasant and cooperative, no obvious depression or anxiety, speech and thought processing grossly intact  ASSESSMENT AND PLAN:  Discussed the following assessment and plan:  Osteopenia We discussed recommendations in regard to calcium and vitamin D supplementation. Fall precautions. Follow-up in 2 to 3 years.  OSA (obstructive sleep apnea) Problem is well controlled with oral device. Continue following with orthodontist   NASH (nonalcoholic steatohepatitis) Probably has been stable. Continue vitamin D 400 IU daily. Continue following with Dr. Edison Nasuti.     I discussed the assessment and treatment plan with the patient. She was provided an opportunity to ask questions and all were answered. The patient agreed with the plan and demonstrated an understanding of the instructions.   Return  in about 8 months (around 11/25/2019) for cpe.    Mica Ramdass [place], MD

## 2019-03-14 NOTE — Assessment & Plan Note (Signed)
We discussed recommendations in regard to calcium and vitamin D supplementation. Fall precautions. Follow-up in 2 to 3 years.

## 2019-03-14 NOTE — Assessment & Plan Note (Signed)
Problem is well controlled with oral device. Continue following with orthodontist

## 2019-04-04 ENCOUNTER — Other Ambulatory Visit: Payer: Self-pay

## 2019-04-04 ENCOUNTER — Ambulatory Visit (INDEPENDENT_AMBULATORY_CARE_PROVIDER_SITE_OTHER): Payer: 59 | Admitting: *Deleted

## 2019-04-04 DIAGNOSIS — Z23 Encounter for immunization: Secondary | ICD-10-CM

## 2019-04-04 NOTE — Progress Notes (Signed)
Patient in for Shingirix #2. Immunization administered with no adverse reactions.

## 2019-04-05 ENCOUNTER — Ambulatory Visit: Payer: 59

## 2019-05-27 DIAGNOSIS — K760 Fatty (change of) liver, not elsewhere classified: Secondary | ICD-10-CM | POA: Diagnosis not present

## 2019-06-06 DIAGNOSIS — K760 Fatty (change of) liver, not elsewhere classified: Secondary | ICD-10-CM | POA: Diagnosis not present

## 2019-06-10 ENCOUNTER — Other Ambulatory Visit: Payer: Self-pay | Admitting: Internal Medicine

## 2019-06-10 ENCOUNTER — Other Ambulatory Visit (HOSPITAL_COMMUNITY): Payer: Self-pay | Admitting: Internal Medicine

## 2019-06-10 DIAGNOSIS — K760 Fatty (change of) liver, not elsewhere classified: Secondary | ICD-10-CM

## 2019-06-16 ENCOUNTER — Ambulatory Visit (HOSPITAL_COMMUNITY)
Admission: RE | Admit: 2019-06-16 | Discharge: 2019-06-16 | Disposition: A | Discharge status: Home / Self Care | Payer: 59 | Source: Ambulatory Visit | Attending: Internal Medicine | Admitting: Internal Medicine

## 2019-06-16 ENCOUNTER — Other Ambulatory Visit: Payer: Self-pay

## 2019-06-16 DIAGNOSIS — K7581 Nonalcoholic steatohepatitis (NASH): Secondary | ICD-10-CM | POA: Diagnosis not present

## 2019-06-16 DIAGNOSIS — K760 Fatty (change of) liver, not elsewhere classified: Secondary | ICD-10-CM | POA: Diagnosis not present

## 2019-08-24 ENCOUNTER — Other Ambulatory Visit: Payer: Self-pay | Admitting: Family Medicine

## 2019-08-24 DIAGNOSIS — Z1231 Encounter for screening mammogram for malignant neoplasm of breast: Secondary | ICD-10-CM

## 2019-09-16 DIAGNOSIS — L821 Other seborrheic keratosis: Secondary | ICD-10-CM | POA: Diagnosis not present

## 2019-09-16 DIAGNOSIS — L819 Disorder of pigmentation, unspecified: Secondary | ICD-10-CM | POA: Diagnosis not present

## 2019-09-16 DIAGNOSIS — D224 Melanocytic nevi of scalp and neck: Secondary | ICD-10-CM | POA: Diagnosis not present

## 2019-09-16 DIAGNOSIS — Z419 Encounter for procedure for purposes other than remedying health state, unspecified: Secondary | ICD-10-CM | POA: Diagnosis not present

## 2019-09-16 DIAGNOSIS — D235 Other benign neoplasm of skin of trunk: Secondary | ICD-10-CM | POA: Diagnosis not present

## 2019-09-16 DIAGNOSIS — L814 Other melanin hyperpigmentation: Secondary | ICD-10-CM | POA: Diagnosis not present

## 2019-09-16 DIAGNOSIS — D1801 Hemangioma of skin and subcutaneous tissue: Secondary | ICD-10-CM | POA: Diagnosis not present

## 2019-09-16 DIAGNOSIS — D225 Melanocytic nevi of trunk: Secondary | ICD-10-CM | POA: Diagnosis not present

## 2019-10-17 ENCOUNTER — Other Ambulatory Visit: Payer: Self-pay

## 2019-10-17 ENCOUNTER — Ambulatory Visit
Admission: RE | Admit: 2019-10-17 | Discharge: 2019-10-17 | Disposition: A | Discharge status: Home / Self Care | Payer: 59 | Source: Ambulatory Visit | Attending: Family Medicine | Admitting: Family Medicine

## 2019-10-17 DIAGNOSIS — Z1231 Encounter for screening mammogram for malignant neoplasm of breast: Secondary | ICD-10-CM | POA: Diagnosis not present

## 2019-12-21 DIAGNOSIS — K760 Fatty (change of) liver, not elsewhere classified: Secondary | ICD-10-CM | POA: Diagnosis not present

## 2019-12-21 DIAGNOSIS — K7581 Nonalcoholic steatohepatitis (NASH): Secondary | ICD-10-CM | POA: Diagnosis not present

## 2020-02-22 ENCOUNTER — Telehealth (INDEPENDENT_AMBULATORY_CARE_PROVIDER_SITE_OTHER): Payer: 59 | Admitting: Internal Medicine

## 2020-02-22 DIAGNOSIS — J069 Acute upper respiratory infection, unspecified: Secondary | ICD-10-CM

## 2020-02-22 NOTE — Progress Notes (Signed)
Virtual Visit via Video Note  I connected with Terry Bell on 02/22/20 at  1:30 PM EDT by a video enabled telemedicine application and verified that I am speaking with the correct person using two identifiers.  Location patient: home Location provider: work office Persons participating in the virtual visit: patient, provider  I discussed the limitations of evaluation and management by telemedicine and the availability of in person appointments. The patient expressed understanding and agreed to proceed.   HPI: She has scheduled this visit to discuss some acute respiratory issues.  Yesterday she walked around her neighborhood and within a few hours started having coughing, postnasal drip, runny nose.  She had a low-grade temperature of 99 today.  She has had both Covid vaccines second dose was in January.  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  She masks when she leaves the house routinely.  She denies high-grade temperatures, shortness of breath.   ROS: Constitutional: Denies fever, chills, diaphoresis, appetite change and fatigue.  HEENT: Denies photophobia, eye pain, redness, hearing loss, ear pain, congestion, sore throat, rhinorrhea, sneezing, mouth sores, trouble swallowing, neck pain, neck stiffness and tinnitus.   Respiratory: Denies SOB, DOE, cough, chest tightness,  and wheezing.   Cardiovascular: Denies chest pain, palpitations and leg swelling.  Gastrointestinal: Denies nausea, vomiting, abdominal pain, diarrhea, constipation, blood in stool and abdominal distention.  Genitourinary: Denies dysuria, urgency, frequency, hematuria, flank pain and difficulty urinating.  Endocrine: Denies: hot or cold intolerance, sweats, changes in hair or nails, polyuria, polydipsia. Musculoskeletal: Denies myalgias, back pain, joint swelling, arthralgias and gait problem.  Skin: Denies pallor, rash and wound.  Neurological: Denies dizziness, seizures, syncope, weakness,  light-headedness, numbness and headaches.  Hematological: Denies adenopathy. Easy bruising, personal or family bleeding history  Psychiatric/Behavioral: Denies suicidal ideation, mood changes, confusion, nervousness, sleep disturbance and agitation   Past Medical History:  Diagnosis Date  . Arthritis   . Continuous leakage of urine   . Depression   . Diverticulosis   . OSA (obstructive sleep apnea) 07/22/2016  . Steatohepatitis   . Tinnitus   . Varicose veins     Past Surgical History:  Procedure Laterality Date  . arthroscopic left knee sugery    . bilateral bunionectomy    . BREAST BIOPSY Left   . CHOLECYSTECTOMY    . colonsocopy    . debridement and removal and lateral release of torn cartlidge in rt knee    . gum grating    . rt knee arthroscopy    . torn cartledge in rt knee      Family History  Problem Relation Age of Onset  . Diabetes Mother   . Liver disease Mother   . Diabetes Sister   . Liver disease Sister   . Hypertension Other   . Cancer Other   . Hemochromatosis Other   . Cancer Father        Throat  . Colon cancer Neg Hx   . Breast cancer Neg Hx     SOCIAL HX:   reports that she has never smoked. She has never used smokeless tobacco. She reports that she does not drink alcohol or use drugs.   Current Outpatient Medications:  .  Cholecalciferol (VITAMIN D) 1000 UNITS capsule, Take 1,000 Units by mouth 2 (two) times daily. , Disp: , Rfl:  .  Omega-3 Fatty Acids (FISH OIL PO), Take 1,000 mg by mouth daily., Disp: , Rfl:  .  Probiotic Product (ALIGN) 4  MG CAPS, Take 4 mg by mouth daily., Disp: , Rfl:  .  TURMERIC PO, Take 538 mg by mouth daily., Disp: , Rfl:  .  vitamin E 400 UNIT capsule, Take 800 Units by mouth. , Disp: , Rfl:   Current Facility-Administered Medications:  .  0.9 %  sodium chloride infusion, 500 mL, Intravenous, Continuous, Fuller Plan, Pricilla Riffle, MD  EXAM:   VITALS per patient if applicable: None reported  GENERAL: alert, oriented,  appears well and in no acute distress  HEENT: atraumatic, conjunttiva clear, no obvious abnormalities on inspection of external nose and ears  NECK: normal movements of the head and neck  LUNGS: on inspection no signs of respiratory distress, breathing rate appears normal, no obvious gross increased work of breathing, gasping or wheezing  CV: no obvious cyanosis  MS: moves all visible extremities without noticeable abnormality  PSYCH/NEURO: pleasant and cooperative, no obvious depression or anxiety, speech and thought processing grossly intact  ASSESSMENT AND PLAN:   Upper respiratory tract infection, unspecified type -For now I have advised antihistamines, Mucinex and decongestants. -She is to contact us if she develops any worsening signs for instructions on what to do. -At this point in time, less than 24 hours into her illness, do not believe antibiotics are warranted.     I discussed the assessment and treatment plan with the patient. The patient was provided an opportunity to ask questions and all were answered. The patient agreed with the plan and demonstrated an understanding of the instructions.   The patient was advised to call back or seek an in-person evaluation if the symptoms worsen or if the condition fails to improve as anticipated.    Lelon Frohlich, MD  [facility]

## 2020-04-13 ENCOUNTER — Other Ambulatory Visit: Payer: Self-pay

## 2020-04-13 ENCOUNTER — Emergency Department (HOSPITAL_COMMUNITY): Payer: 59

## 2020-04-13 ENCOUNTER — Encounter (HOSPITAL_COMMUNITY): Payer: Self-pay

## 2020-04-13 ENCOUNTER — Observation Stay (HOSPITAL_COMMUNITY)
Admission: EM | Admit: 2020-04-13 | Discharge: 2020-04-14 | Disposition: A | Discharge status: Home / Self Care | Payer: 59 | Attending: Internal Medicine | Admitting: Internal Medicine

## 2020-04-13 ENCOUNTER — Observation Stay (HOSPITAL_COMMUNITY): Payer: 59

## 2020-04-13 DIAGNOSIS — R110 Nausea: Secondary | ICD-10-CM | POA: Insufficient documentation

## 2020-04-13 DIAGNOSIS — R519 Headache, unspecified: Secondary | ICD-10-CM

## 2020-04-13 DIAGNOSIS — C712 Malignant neoplasm of temporal lobe: Secondary | ICD-10-CM

## 2020-04-13 DIAGNOSIS — G9389 Other specified disorders of brain: Secondary | ICD-10-CM | POA: Diagnosis not present

## 2020-04-13 DIAGNOSIS — G4489 Other headache syndrome: Secondary | ICD-10-CM

## 2020-04-13 DIAGNOSIS — Z20822 Contact with and (suspected) exposure to covid-19: Secondary | ICD-10-CM | POA: Insufficient documentation

## 2020-04-13 DIAGNOSIS — G936 Cerebral edema: Secondary | ICD-10-CM | POA: Diagnosis not present

## 2020-04-13 DIAGNOSIS — R930 Abnormal findings on diagnostic imaging of skull and head, not elsewhere classified: Secondary | ICD-10-CM | POA: Diagnosis not present

## 2020-04-13 DIAGNOSIS — R439 Unspecified disturbances of smell and taste: Secondary | ICD-10-CM | POA: Insufficient documentation

## 2020-04-13 DIAGNOSIS — G939 Disorder of brain, unspecified: Secondary | ICD-10-CM | POA: Diagnosis not present

## 2020-04-13 LAB — BASIC METABOLIC PANEL
Anion gap: 9 (ref 5–15)
BUN: 18 mg/dL (ref 8–23)
CO2: 22 mmol/L (ref 22–32)
Calcium: 9.3 mg/dL (ref 8.9–10.3)
Chloride: 106 mmol/L (ref 98–111)
Creatinine, Ser: 0.84 mg/dL (ref 0.44–1.00)
GFR calc Af Amer: >60 mL/min (ref >60)
GFR calc non Af Amer: >60 mL/min (ref >60)
Glucose, Bld: 107 mg/dL — ABNORMAL HIGH (ref 70–99)
Potassium: 4 mmol/L (ref 3.5–5.1)
Sodium: 137 mmol/L (ref 135–145)

## 2020-04-13 LAB — CBC WITH DIFFERENTIAL/PLATELET
Abs Immature Granulocytes: 0.01 10*3/uL (ref 0.00–0.07)
Basophils Absolute: 0 10*3/uL (ref 0.0–0.1)
Basophils Relative: 1 %
Eosinophils Absolute: 0.1 10*3/uL (ref 0.0–0.5)
Eosinophils Relative: 1 %
HCT: 44.2 % (ref 36.0–46.0)
Hemoglobin: 15.1 g/dL — ABNORMAL HIGH (ref 12.0–15.0)
Immature Granulocytes: 0 %
Lymphocytes Relative: 35 %
Lymphs Abs: 1.9 10*3/uL (ref 0.7–4.0)
MCH: 31.1 pg (ref 26.0–34.0)
MCHC: 34.2 g/dL (ref 30.0–36.0)
MCV: 90.9 fL (ref 80.0–100.0)
Monocytes Absolute: 0.6 10*3/uL (ref 0.1–1.0)
Monocytes Relative: 11 %
Neutro Abs: 2.7 10*3/uL (ref 1.7–7.7)
Neutrophils Relative %: 52 %
Platelets: 158 10*3/uL (ref 150–400)
RBC: 4.86 MIL/uL (ref 3.87–5.11)
RDW: 11.9 % (ref 11.5–15.5)
WBC: 5.3 10*3/uL (ref 4.0–10.5)
nRBC: 0 % (ref 0.0–0.2)

## 2020-04-13 LAB — PROTIME-INR
INR: 1 (ref 0.8–1.2)
Prothrombin Time: 12.4 seconds (ref 11.4–15.2)

## 2020-04-13 MED ORDER — OXYCODONE HCL 5 MG PO TABS: 5.0000 mg | ORAL_TABLET | Freq: Once | ORAL | Status: DC

## 2020-04-13 MED ORDER — LORAZEPAM 2 MG/ML IJ SOLN
0.5000 mg | Freq: Once | INTRAMUSCULAR | Status: AC
  Administered 2020-04-13: 0.5 mg via INTRAVENOUS
  Filled 2020-04-13: qty 1

## 2020-04-13 MED ORDER — OXYCODONE HCL 5 MG PO TABS
5.0000 mg | ORAL_TABLET | ORAL | Status: AC
  Administered 2020-04-13: 5 mg via ORAL
  Filled 2020-04-13: qty 1

## 2020-04-13 MED ORDER — ONDANSETRON HCL 4 MG/2ML IJ SOLN: 4.0000 mg | Freq: Four times a day (QID) | INTRAMUSCULAR | Status: DC | PRN

## 2020-04-13 MED ORDER — HYDROCODONE-ACETAMINOPHEN 5-325 MG PO TABS
1.0000 | ORAL_TABLET | ORAL | Status: DC | PRN
  Administered 2020-04-14: 2 via ORAL
  Filled 2020-04-13: qty 2

## 2020-04-13 MED ORDER — DEXAMETHASONE 4 MG PO TABS
6.0000 mg | ORAL_TABLET | Freq: Four times a day (QID) | ORAL | Status: DC
  Administered 2020-04-14 (×2): 6 mg via ORAL
  Filled 2020-04-13 (×2): qty 1

## 2020-04-13 MED ORDER — DEXAMETHASONE SODIUM PHOSPHATE 10 MG/ML IJ SOLN
6.0000 mg | Freq: Once | INTRAMUSCULAR | Status: AC
  Administered 2020-04-13: 6 mg via INTRAVENOUS
  Filled 2020-04-13: qty 1

## 2020-04-13 MED ORDER — MORPHINE SULFATE (PF) 4 MG/ML IV SOLN: 4.0000 mg | INTRAVENOUS | Status: DC | PRN

## 2020-04-13 MED ORDER — FENTANYL CITRATE (PF) 100 MCG/2ML IJ SOLN
50.0000 ug | Freq: Once | INTRAMUSCULAR | Status: AC
  Administered 2020-04-13: 50 ug via INTRAVENOUS
  Filled 2020-04-13: qty 2

## 2020-04-13 NOTE — ED Notes (Signed)
Pt transported to MRI 

## 2020-04-13 NOTE — Consult Note (Signed)
Reason for Consult:brain tumor Referring Physician: Regenia Skeeter, scott  [facility] is an 64 y.o. female.  HPI: whom has noticed new headaches described as severe, worst being felt today and a change in how food has tasted. Had planned on dinner out with there husband, but her headache was so severe she felt she should come to the ED. No seizures, no trauma, no other symptoms. Has a history of [name].  Past Medical History:  Diagnosis Date  . Arthritis   . Continuous leakage of urine   . Depression   . Diverticulosis   . OSA (obstructive sleep apnea) 07/22/2016  . Steatohepatitis   . Tinnitus   . Varicose veins     Past Surgical History:  Procedure Laterality Date  . arthroscopic left knee sugery    . bilateral bunionectomy    . BREAST BIOPSY Left   . CHOLECYSTECTOMY    . colonsocopy    . debridement and removal and lateral release of torn cartlidge in rt knee    . gum grating    . rt knee arthroscopy    . torn cartledge in rt knee      Family History  Problem Relation Age of Onset  . Diabetes Mother   . Liver disease Mother   . Diabetes Sister   . Liver disease Sister   . Hypertension Other   . Cancer Other   . Hemochromatosis Other   . Cancer Father        Throat  . Colon cancer Neg Hx   . Breast cancer Neg Hx     Social History:  reports that she has never smoked. She has never used smokeless tobacco. She reports that she does not drink alcohol and does not use drugs.  Allergies:  Allergies  Allergen Reactions  . Niacin Hives    REACTION: rash    Medications: I have reviewed the patient's current medications.  Results for orders placed or performed during the hospital encounter of 04/13/20 (from the past 48 hour(s))  Basic metabolic panel     Status: Abnormal   Collection Time: 04/13/20  9:48 PM  Result Value Ref Range   Sodium 137 135 - 145 mmol/L   Potassium 4.0 3.5 - 5.1 mmol/L   Chloride 106 98 - 111 mmol/L   CO2 22 22 - 32 mmol/L   Glucose,  Bld 107 (H) 70 - 99 mg/dL    Comment: Glucose reference range applies only to samples taken after fasting for at least 8 hours.   BUN 18 8 - 23 mg/dL   Creatinine, Ser 0.84 0.44 - 1.00 mg/dL   Calcium 9.3 8.9 - 10.3 mg/dL   GFR calc non Af Amer >60 >60 mL/min   GFR calc Af Amer >60 >60 mL/min   Anion gap 9 5 - 15    Comment: Performed at [facility] [address], [place], [place] [address]  CBC with Differential     Status: Abnormal   Collection Time: 04/13/20  9:48 PM  Result Value Ref Range   WBC 5.3 4.0 - 10.5 K/uL   RBC 4.86 3.87 - 5.11 MIL/uL   Hemoglobin 15.1 (H) 12.0 - 15.0 g/dL   HCT 44.2 36 - 46 %   MCV 90.9 80.0 - 100.0 fL   MCH 31.1 26.0 - 34.0 pg   MCHC 34.2 30.0 - 36.0 g/dL   RDW 11.9 11.5 - 15.5 %   Platelets 158 150 - 400 K/uL   nRBC  0.0 0.0 - 0.2 %   Neutrophils Relative % 52 %   Neutro Abs 2.7 1.7 - 7.7 K/uL   Lymphocytes Relative 35 %   Lymphs Abs 1.9 0.7 - 4.0 K/uL   Monocytes Relative 11 %   Monocytes Absolute 0.6 0 - 1 K/uL   Eosinophils Relative 1 %   Eosinophils Absolute 0.1 0 - 0 K/uL   Basophils Relative 1 %   Basophils Absolute 0.0 0 - 0 K/uL   Immature Granulocytes 0 %   Abs Immature Granulocytes 0.01 0.00 - 0.07 K/uL    Comment: Performed at [facility] [address], [place], [place] [address]  Protime-INR     Status: None   Collection Time: 04/13/20  9:48 PM  Result Value Ref Range   Prothrombin Time 12.4 11.4 - 15.2 seconds   INR 1.0 0.8 - 1.2    Comment: (NOTE) INR goal varies based on device and disease states. Performed at [facility], [place] [address] [place], [place] [address]     CT Head Wo Contrast  Result Date: 04/13/2020 CLINICAL DATA:  Headache EXAM: CT HEAD WITHOUT CONTRAST TECHNIQUE: Contiguous axial images were obtained from the base of the skull through the vertex without intravenous contrast. COMPARISON:  MRI 02/20/2012 FINDINGS: Brain: No acute intracranial hemorrhage. Large hypodense mass with  peripheral soft tissue density within the right temporoparietal region, measures approximately 5.2 x 4.7 x 3.7 cm. Surrounding hypodense edema within the white matter of the right temporal and parietal lobes with involvement of right basal ganglia and thalamus. Compression of the right lateral ventricle with possible mild trapped appearance of the right temporal horn. Approximately 6 mm midline shift to the left. Generalized sulcal edema on the right consistent with brain swelling. Vascular: No hyperdense vessels. Scattered carotid vascular calcification Skull: Normal. Negative for fracture or focal lesion. Sinuses/Orbits: No acute finding. Other: None IMPRESSION: 1. Large mass within the right temporoparietal region with significant surrounding edema and localized mass effect, including compression of right lateral ventricle and about 6 mm midline shift to the left. Generalized sulcal effacement in the right hemisphere consistent with edema. Mild asymmetric enlargement of the right temporal horn of the lateral ventricle. Critical Value/emergent results were called by telephone at the time of interpretation on 04/13/2020 at 9:15 pm to provider [facility] , who verbally acknowledged these results. Electronically Signed   By: Donavan Foil M.D.   On: 04/13/2020 21:15    Review of Systems  Constitutional: Negative.   HENT:       Change in taste  Eyes: Negative.   Respiratory: Negative.   Cardiovascular: Negative.   Gastrointestinal: Negative.   Endocrine: Negative.   Genitourinary: Negative.   Musculoskeletal: Negative.   Skin: Negative.   Allergic/Immunologic: Negative.   Neurological: Negative.   Hematological: Negative.   Psychiatric/Behavioral: Negative.    Blood pressure (!) 148/93, pulse 74, temperature 98.3 F (36.8 C), temperature source Oral, resp. rate 11, height 5' 5"  (1.651 m), weight 79.4 kg, SpO2 97 %. Physical Exam  Constitutional: She is oriented to person, place, and time.  She appears well-developed. She does not appear ill.  HENT:  Head: Normocephalic and atraumatic.  Mouth/Throat: Mucous membranes are moist. Oropharynx is clear.  Eyes: Pupils are equal, round, and reactive to light. Right eye exhibits normal extraocular motion. Left eye exhibits normal extraocular motion.  Cardiovascular: Normal rate and regular rhythm.  Respiratory: Effort normal.  GI: Soft. Bowel sounds are normal.  Musculoskeletal:  General: Normal range of motion.     Cervical back: Normal range of motion.  Neurological: She is alert and oriented to person, place, and time. She has normal motor skills, normal sensation, normal reflexes and intact cranial nerves. She shows no pronator drift. Gait and coordination normal.  Normal porprioception Normal coordination   Skin: Skin is warm and dry.    Assessment/Plan: Terry Bell is a 64 y.o. female Long time [employer] employee with new headaches and change in her taste. Head CT shows lesion in temporal lobe on the right with significant edema causing some mass effect. Will need an MRI with and without contrast. Has been started on decadron 28m q6 which should improve headaches. Once MRI is completed and headache improved she would be ok to discharge. I will follow here, and will be able to see in the office on Monday.   KAshok Pall6/18/2021, 10:43 PM

## 2020-04-13 NOTE — ED Triage Notes (Signed)
Pt arrives POV for eval of HA sudden onset this evening. Pt reports +nausea, otherwise neuro intact in triage. Pt states she does not get headaches, and this is abnormal for.Pt reports +photophobia.

## 2020-04-13 NOTE — H&P (View-Only) (Signed)
Reason for Consult:brain tumor Referring Physician: Regenia Bell, [facility] Bell  Terry Bell is an 64 y.o. female.  HPI: whom has noticed new headaches described as severe, worst being felt today and a change in how food has tasted. Had planned on dinner out with there husband, but her headache was so severe she felt she should come to the ED. No seizures, no trauma, no other symptoms. Has a history of Terry Bell.  Past Medical History:  Diagnosis Date  . Arthritis   . Continuous leakage of urine   . Depression   . Diverticulosis   . OSA (obstructive sleep apnea) 07/22/2016  . Steatohepatitis   . Tinnitus   . Varicose veins     Past Surgical History:  Procedure Laterality Date  . arthroscopic left knee sugery    . bilateral bunionectomy    . BREAST BIOPSY Left   . CHOLECYSTECTOMY    . colonsocopy    . debridement and removal and lateral release of torn cartlidge in rt knee    . gum grating    . rt knee arthroscopy    . torn cartledge in rt knee      Family History  Problem Relation Age of Onset  . Diabetes Mother   . Liver disease Mother   . Diabetes Sister   . Liver disease Sister   . Hypertension Other   . Cancer Other   . Hemochromatosis Other   . Cancer Father        Throat  . Colon cancer Neg Hx   . Breast cancer Neg Hx     Social History:  reports that she has never smoked. She has never used smokeless tobacco. She reports that she does not drink alcohol and does not use drugs.  Allergies:  Allergies  Allergen Reactions  . Niacin Hives    REACTION: rash    Medications: I have reviewed the patient's current medications.  Results for orders placed or performed during the hospital encounter of 04/13/20 (from the past 48 hour(s))  Basic metabolic panel     Status: Abnormal   Collection Time: 04/13/20  9:48 PM  Result Value Ref Range   Sodium 137 135 - 145 mmol/L   Potassium 4.0 3.5 - 5.1 mmol/L   Chloride 106 98 - 111 mmol/L   CO2 22 22 - 32 mmol/L   Glucose,  Bld 107 (H) 70 - 99 mg/dL    Comment: Glucose reference range applies only to samples taken after fasting for at least 8 hours.   BUN 18 8 - 23 mg/dL   Creatinine, Ser 0.84 0.44 - 1.00 mg/dL   Calcium 9.3 8.9 - 10.3 mg/dL   GFR calc non Af Amer >60 >60 mL/min   GFR calc Af Amer >60 >60 mL/min   Anion gap 9 5 - 15    Comment: Performed at [facility] [address] [place], [place] [address]  CBC with Differential     Status: Abnormal   Collection Time: 04/13/20  9:48 PM  Result Value Ref Range   WBC 5.3 4.0 - 10.5 K/uL   RBC 4.86 3.87 - 5.11 MIL/uL   Hemoglobin 15.1 (H) 12.0 - 15.0 g/dL   HCT 44.2 36 - 46 %   MCV 90.9 80.0 - 100.0 fL   MCH 31.1 26.0 - 34.0 pg   MCHC 34.2 30.0 - 36.0 g/dL   RDW 11.9 11.5 - 15.5 %   Platelets 158 150 - 400 K/uL   nRBC  0.0 0.0 - 0.2 %   Neutrophils Relative % 52 %   Neutro Abs 2.7 1.7 - 7.7 K/uL   Lymphocytes Relative 35 %   Lymphs Abs 1.9 0.7 - 4.0 K/uL   Monocytes Relative 11 %   Monocytes Absolute 0.6 0 - 1 K/uL   Eosinophils Relative 1 %   Eosinophils Absolute 0.1 0 - 0 K/uL   Basophils Relative 1 %   Basophils Absolute 0.0 0 - 0 K/uL   Immature Granulocytes 0 %   Abs Immature Granulocytes 0.01 0.00 - 0.07 K/uL    Comment: Performed at [facility] [address], [place], [place] [address]  Protime-INR     Status: None   Collection Time: 04/13/20  9:48 PM  Result Value Ref Range   Prothrombin Time 12.4 11.4 - 15.2 seconds   INR 1.0 0.8 - 1.2    Comment: (NOTE) INR goal varies based on device and disease states. Performed at [facility], [place] [facility] [address] [place], [place] [address]     CT Head Wo Contrast  Result Date: 04/13/2020 CLINICAL DATA:  Headache EXAM: CT HEAD WITHOUT CONTRAST TECHNIQUE: Contiguous axial images were obtained from the base of the skull through the vertex without intravenous contrast. COMPARISON:  MRI 02/20/2012 FINDINGS: Brain: No acute intracranial hemorrhage. Large hypodense mass with  peripheral soft tissue density within the right temporoparietal region, measures approximately 5.2 x 4.7 x 3.7 cm. Surrounding hypodense edema within the white matter of the right temporal and parietal lobes with involvement of right basal ganglia and thalamus. Compression of the right lateral ventricle with possible mild trapped appearance of the right temporal horn. Approximately 6 mm midline shift to the left. Generalized sulcal edema on the right consistent with brain swelling. Vascular: No hyperdense vessels. Scattered carotid vascular calcification Skull: Normal. Negative for fracture or focal lesion. Sinuses/Orbits: No acute finding. Other: None IMPRESSION: 1. Large mass within the right temporoparietal region with significant surrounding edema and localized mass effect, including compression of right lateral ventricle and about 6 mm midline shift to the left. Generalized sulcal effacement in the right hemisphere consistent with edema. Mild asymmetric enlargement of the right temporal horn of the lateral ventricle. Critical Value/emergent results were called by telephone at the time of interpretation on 04/13/2020 at 9:15 pm to provider [facility] , who verbally acknowledged these results. Electronically Signed   By: Donavan Foil M.D.   On: 04/13/2020 21:15    Review of Systems  Constitutional: Negative.   HENT:       Change in taste  Eyes: Negative.   Respiratory: Negative.   Cardiovascular: Negative.   Gastrointestinal: Negative.   Endocrine: Negative.   Genitourinary: Negative.   Musculoskeletal: Negative.   Skin: Negative.   Allergic/Immunologic: Negative.   Neurological: Negative.   Hematological: Negative.   Psychiatric/Behavioral: Negative.    Blood pressure (!) 148/93, pulse 74, temperature 98.3 F (36.8 C), temperature source Oral, resp. rate 11, height 5' 5"  (1.651 m), weight 79.4 kg, SpO2 97 %. Physical Exam  Constitutional: She is oriented to person, place, and time.  She appears well-developed. She does not appear ill.  HENT:  Head: Normocephalic and atraumatic.  Mouth/Throat: Mucous membranes are moist. Oropharynx is clear.  Eyes: Pupils are equal, round, and reactive to light. Right eye exhibits normal extraocular motion. Left eye exhibits normal extraocular motion.  Cardiovascular: Normal rate and regular rhythm.  Respiratory: Effort normal.  GI: Soft. Bowel sounds are normal.  Musculoskeletal:  General: Normal range of motion.     Cervical back: Normal range of motion.  Neurological: She is alert and oriented to person, place, and time. She has normal motor skills, normal sensation, normal reflexes and intact cranial nerves. She shows no pronator drift. Gait and coordination normal.  Normal porprioception Normal coordination   Skin: Skin is warm and dry.    Assessment/Plan: Terry Bell is a 64 y.o. female Long time [employer] employee with new headaches and change in her taste. Head CT shows lesion in temporal lobe on the right with significant edema causing some mass effect. Will need an MRI with and without contrast. Has been started on decadron 62m q6 which should improve headaches. Once MRI is completed and headache improved she would be ok to discharge. I will follow here, and will be able to see in the office on Monday.   KAshok Pall6/18/2021, 10:43 PM

## 2020-04-13 NOTE — ED Provider Notes (Signed)
[facility] EMERGENCY DEPARTMENT Provider Note   CSN: [id number] Arrival date & time: 04/13/20  2023     History Chief Complaint  Patient presents with  . Headache    Terry Bell is a 64 y.o. female w PMHx NASH, presenting to the ED with complaint of sudden onset of progressively worsening frontal headache that began this evening at 5pm. Pain is described as a pressure or tightness pain. She reports it was severe, improving with the oxycodone given in triage. Reports some mild imbalance today after sx onset, though no other neuro symptoms. Some mild brief nausea.  She also reports over the last few weeks she has had changes in her taste, described as food tasting putrid to her, though normal to her husband. Not on anticoagulation.  The history is provided by the patient.       Past Medical History:  Diagnosis Date  . Arthritis   . Continuous leakage of urine   . Depression   . Diverticulosis   . OSA (obstructive sleep apnea) 07/22/2016  . Steatohepatitis   . Tinnitus   . Varicose veins     Patient Active Problem List   Diagnosis Date Noted  . Brain mass 04/13/2020  . Headache 04/13/2020  . Osteopenia 11/09/2018  . OSA (obstructive sleep apnea) 07/22/2016  . NASH (nonalcoholic steatohepatitis) 07/18/2016  . Fibrosis of liver 12/20/2015  . ACNE ROSACEA 01/01/2010  . 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 OSTEOARTHRITIS 12/16/2007  . VARICOSE VEIN, LWR EXTREMITIES W/INFLAMMATION 07/07/2007    Past Surgical History:  Procedure Laterality Date  . arthroscopic left knee sugery    . bilateral bunionectomy    . BREAST BIOPSY Left   . CHOLECYSTECTOMY    . colonsocopy    . debridement and removal and lateral release of torn cartlidge in rt knee    . gum grating    . rt knee arthroscopy    . torn cartledge in rt knee       OB History   No obstetric history on file.     Family History  Problem Relation Age of Onset  .  Diabetes Mother   . Liver disease Mother   . Diabetes Sister   . Liver disease Sister   . Hypertension Other   . Cancer Other   . Hemochromatosis Other   . Cancer Father        Throat  . Colon cancer Neg Hx   . Breast cancer Neg Hx     Social History   Tobacco Use  . Smoking status: Never Smoker  . Smokeless tobacco: Never Used  Substance Use Topics  . Alcohol use: No  . Drug use: No    Home Medications Prior to Admission medications   Medication Sig Start Date End Date Taking? Authorizing Provider  Cholecalciferol (VITAMIN D) 1000 UNITS capsule Take 1,000 Units by mouth 2 (two) times daily.     [provider]  Omega-3 Fatty Acids (FISH OIL PO) Take 1,000 mg by mouth daily.    [provider]  Probiotic Product (ALIGN) 4 MG CAPS Take 4 mg by mouth daily.    [provider]  TURMERIC PO Take 538 mg by mouth daily.    [provider]  vitamin E 400 UNIT capsule Take 800 Units by mouth.     [provider]    Allergies    Niacin  Review of Systems   Review of Systems  All other  systems reviewed and are negative.   Physical Exam Updated Vital Signs BP 139/87   Pulse 83   Temp 98.3 F (36.8 C) (Oral)   Resp 16   Ht 5' 5"  (1.651 m)   Wt 79.4 kg   SpO2 98%   BMI 29.12 kg/m   Physical Exam Vitals and nursing note reviewed.  Constitutional:      General: She is not in acute distress.    Appearance: She is well-developed.  HENT:     Head: Normocephalic and atraumatic.  Eyes:     Conjunctiva/sclera: Conjunctivae normal.  Cardiovascular:     Rate and Rhythm: Normal rate and regular rhythm.  Pulmonary:     Effort: Pulmonary effort is normal. No respiratory distress.     Breath sounds: Normal breath sounds.  Abdominal:     Palpations: Abdomen is soft.  Skin:    General: Skin is warm.  Neurological:     Mental Status: She is alert.     Comments: Mental Status:  Alert, oriented, thought content appropriate, able  to give a coherent history. Speech fluent without evidence of aphasia. Able to follow 2 step commands without difficulty.  Cranial Nerves:  II:  Peripheral visual fields grossly normal, pupils equal, round, reactive to light III,IV, VI: ptosis not present, extra-ocular motions intact bilaterally  V,VII: smile symmetric, facial light touch sensation equal VIII: hearing grossly normal to voice  X: uvula elevates symmetrically  XI: bilateral shoulder shrug symmetric and strong XII: midline tongue extension without fassiculations Motor:  Normal tone. 5/5 strength in upper and lower extremities bilaterally including strong and equal grip strength and dorsiflexion/plantar flexion Sensory: grossly normal in all extremities.  Cerebellar: normal finger-to-nose with bilateral upper extremities CV: distal pulses palpable throughout    Psychiatric:        Behavior: Behavior normal.     ED Results / Procedures / Treatments   Labs (all labs ordered are listed, but only abnormal results are displayed) Labs Reviewed  BASIC METABOLIC PANEL - Abnormal; Notable for the following components:      Result Value   Glucose, Bld 107 (*)    All other components within normal limits  CBC WITH DIFFERENTIAL/PLATELET - Abnormal; Notable for the following components:   Hemoglobin 15.1 (*)    All other components within normal limits  SARS CORONAVIRUS 2 BY RT PCR (HOSPITAL ORDER, [facility])  PROTIME-INR    EKG None  Radiology CT Head Wo Contrast  Result Date: 04/13/2020 CLINICAL DATA:  Headache EXAM: CT HEAD WITHOUT CONTRAST TECHNIQUE: Contiguous axial images were obtained from the base of the skull through the vertex without intravenous contrast. COMPARISON:  MRI 02/20/2012 FINDINGS: Brain: No acute intracranial hemorrhage. Large hypodense mass with peripheral soft tissue density within the right temporoparietal region, measures approximately 5.2 x 4.7 x 3.7 cm. Surrounding  hypodense edema within the white matter of the right temporal and parietal lobes with involvement of right basal ganglia and thalamus. Compression of the right lateral ventricle with possible mild trapped appearance of the right temporal horn. Approximately 6 mm midline shift to the left. Generalized sulcal edema on the right consistent with brain swelling. Vascular: No hyperdense vessels. Scattered carotid vascular calcification Skull: Normal. Negative for fracture or focal lesion. Sinuses/Orbits: No acute finding. Other: None IMPRESSION: 1. Large mass within the right temporoparietal region with significant surrounding edema and localized mass effect, including compression of right lateral ventricle and about 6 mm midline shift to the left.  Generalized sulcal effacement in the right hemisphere consistent with edema. Mild asymmetric enlargement of the right temporal horn of the lateral ventricle. Critical Value/emergent results were called by telephone at the time of interpretation on 04/13/2020 at 9:15 pm to provider [facility] , who verbally acknowledged these results. Electronically Signed   By: Donavan Foil M.D.   On: 04/13/2020 21:15    Procedures Procedures (including critical care time)  Medications Ordered in ED Medications  dexamethasone (DECADRON) tablet 6 mg (has no administration in time range)  HYDROcodone-acetaminophen (NORCO/VICODIN) 5-325 MG per tablet 1-2 tablet (has no administration in time range)  ondansetron (ZOFRAN) injection 4 mg (has no administration in time range)  morphine 4 MG/ML injection 4 mg (has no administration in time range)  oxyCODONE (Oxy IR/ROXICODONE) immediate release tablet 5 mg (5 mg Oral Given 04/13/20 2048)  fentaNYL (SUBLIMAZE) injection 50 mcg (50 mcg Intravenous Given 04/13/20 2201)  dexamethasone (DECADRON) injection 6 mg (6 mg Intravenous Given 04/13/20 2225)  LORazepam (ATIVAN) injection 0.5 mg (0.5 mg Intravenous Given 04/13/20 2307)    ED Course   I have reviewed the triage vital signs and the nursing notes.  Pertinent labs & imaging results that were available during my care of the patient were reviewed by me and considered in my medical decision making (see chart for details).  Clinical Course as of Apr 13 2320  Fri Apr 13, 2020  2201 Dr. Cyndy Freeze with neurosurgery evaluated pt. Recommends MRI brain w/wo, admit overnight.   [JR]  2257 Dr. Myna Hidalgo accepting admission   [JR]    Clinical Course User Index [JR] Kenlee Vogt, [place] N, PA-C   MDM Rules/Calculators/A&P                          Pt presenting with gradual onset of rapidly progressing frontal headache began this evening at 5 PM.  She reports some mild imbalance this evening though otherwise no acute neuro symptoms.  No visual changes.  She does endorse odd changes in her taste over the last 3 weeks.  She has had some improvement in her headache after oxycodone given in triage.  On exam today there are no focal neuro deficits.  CT scan obtained in triage shows large mass within the right temporal parietal region with significant surrounding edema, localized mass-effect, compression of the right lateral ventricle about 6 mm in line shift to the left.  Labs pending at this time.  Pain medication ordered.  Dr. Regenia Skeeter made aware.  Consulted with Dr. Christella Noa with neurosurgery.  Recommends 6 mg of Decadron, he will evaluate patient in the ED.  Final Clinical Impression(s) / ED Diagnoses Final diagnoses:  Brain mass  Bad headache    Rx / DC Orders ED Discharge Orders    None       Dailyn Kempner, [place] N, PA-C 04/13/20 2321    Sherwood Gambler, MD 04/14/20 1512

## 2020-04-13 NOTE — H&P (Signed)
History and Physical    Terry Bell IRW:[id number] DOB: August 15, 1956 DOA: 04/13/2020  PCP: [place], Betty G, MD   Patient coming from: Home   Chief Complaint: Headache   HPI: Terry Bell is a 64 y.o. female with medical history significant for NASH, presented to emergency department for evaluation of headache.  Patient reported noticing a change in taste in recent weeks but had otherwise been in her usual state until this evening when she developed a headache and some nausea.  She does not usually get headaches, denies any recent fall or trauma, but developed a pressure sensation in the front of her head that has progressively worsened and become severe.  There has not been any change in vision or any focal weakness.  She denies any seizures.   ED Course: Upon arrival to the ED, patient is found to be afebrile, saturating well on room air, and with stable blood pressure.  Chemistry panel and CBC are unremarkable.  CT head is concerning for large mucous in the right temporoparietal region with significant surrounding edema and localized mass-effect.  Neurosurgery was consulted, evaluated the patient in the emergency department, recommends that she be observed on the medical service with MRI brain, Decadron, and then once her headache is controlled and MRI completed, she can be discharged to follow-up with him on 04/16/2020.  Review of Systems:  All other systems reviewed and apart from HPI, are negative.  Past Medical History:  Diagnosis Date  . Arthritis   . Continuous leakage of urine   . Depression   . Diverticulosis   . OSA (obstructive sleep apnea) 07/22/2016  . Steatohepatitis   . Tinnitus   . Varicose veins     Past Surgical History:  Procedure Laterality Date  . arthroscopic left knee sugery    . bilateral bunionectomy    . BREAST BIOPSY Left   . CHOLECYSTECTOMY    . colonsocopy    . debridement and removal and lateral release of torn cartlidge in rt knee    .  gum grating    . rt knee arthroscopy    . torn cartledge in rt knee       reports that she has never smoked. She has never used smokeless tobacco. She reports that she does not drink alcohol and does not use drugs.  Allergies  Allergen Reactions  . Niacin Hives    REACTION: rash    Family History  Problem Relation Age of Onset  . Diabetes Mother   . Liver disease Mother   . Diabetes Sister   . Liver disease Sister   . Hypertension Other   . Cancer Other   . Hemochromatosis Other   . Cancer Father        Throat  . Colon cancer Neg Hx   . Breast cancer Neg Hx      Prior to Admission medications   Medication Sig Start Date End Date Taking? Authorizing Provider  Cholecalciferol (VITAMIN D) 1000 UNITS capsule Take 1,000 Units by mouth 2 (two) times daily.     [provider]  Omega-3 Fatty Acids (FISH OIL PO) Take 1,000 mg by mouth daily.    [provider]  Probiotic Product (ALIGN) 4 MG CAPS Take 4 mg by mouth daily.    [provider]  TURMERIC PO Take 538 mg by mouth daily.    [provider]  vitamin E 400 UNIT capsule Take 800 Units by mouth.     [provider]    Physical Exam: Vitals:   04/13/20 2029 04/13/20 2130 04/13/20 2245  BP: (!) 127/91 (!) 148/93 139/87  Pulse: 96 74 83  Resp: 20 11 16   Temp: 98.3 F (36.8 C)    TempSrc: Oral    SpO2: 99% 97% 98%  Weight: 79.4 kg    Height: 5' 5"  (1.651 m)      Constitutional: NAD, calm  Eyes: PERTLA, lids and conjunctivae normal ENMT: Mucous membranes are moist. Posterior pharynx clear of any exudate or lesions.   Neck: normal, supple, no masses, no thyromegaly Respiratory: no wheezing, no crackles. No accessory muscle use.  Cardiovascular: S1 & S2 heard, regular rate and rhythm. No extremity edema.   Abdomen: No distension, no tenderness, soft. Bowel sounds active.  Musculoskeletal: no clubbing / cyanosis. No joint deformity upper and lower extremities.   Skin: no  significant rashes, lesions, ulcers. Warm, dry, well-perfused. Neurologic: no gross facial asymmetry. Sensation intact. Moving all extremities.  Psychiatric: Alert and oriented to person, place, and situation. Pleasant and cooperative.    Labs and Imaging on Admission: I have personally reviewed following labs and imaging studies  CBC: Recent Labs  Lab 04/13/20 2148  WBC 5.3  NEUTROABS 2.7  HGB 15.1*  HCT 44.2  MCV 90.9  PLT 662   Basic Metabolic Panel: Recent Labs  Lab 04/13/20 2148  NA 137  K 4.0  CL 106  CO2 22  GLUCOSE 107*  BUN 18  CREATININE 0.84  CALCIUM 9.3   GFR: Estimated Creatinine Clearance: 71.4 mL/min (by C-G formula based on SCr of 0.84 mg/dL). Liver Function Tests: No results for input(s): AST, ALT, ALKPHOS, BILITOT, PROT, ALBUMIN in the last 168 hours. No results for input(s): LIPASE, AMYLASE in the last 168 hours. No results for input(s): AMMONIA in the last 168 hours. Coagulation Profile: Recent Labs  Lab 04/13/20 2148  INR 1.0   Cardiac Enzymes: No results for input(s): CKTOTAL, CKMB, CKMBINDEX, TROPONINI in the last 168 hours. BNP (last 3 results) No results for input(s): PROBNP in the last 8760 hours. HbA1C: No results for input(s): HGBA1C in the last 72 hours. CBG: No results for input(s): GLUCAP in the last 168 hours. Lipid Profile: No results for input(s): CHOL, HDL, LDLCALC, TRIG, CHOLHDL, LDLDIRECT in the last 72 hours. Thyroid Function Tests: No results for input(s): TSH, T4TOTAL, FREET4, T3FREE, THYROIDAB in the last 72 hours. Anemia Panel: No results for input(s): VITAMINB12, FOLATE, FERRITIN, TIBC, IRON, RETICCTPCT in the last 72 hours. Urine analysis:    Component Value Date/Time   COLORURINE yellow 10/01/2010 0936   APPEARANCEUR Clear 10/01/2010 0936   LABSPEC 1.020 10/01/2010 0936   PHURINE 7.0 10/01/2010 0936   HGBUR negative 10/01/2010 0936   BILIRUBINUR negative 12/12/2016 1037   PROTEINUR 30+ 12/12/2016 1037    UROBILINOGEN 0.2 12/12/2016 1037   UROBILINOGEN 0.2 10/01/2010 0936   NITRITE positive 12/12/2016 1037   NITRITE negative 10/01/2010 0936   LEUKOCYTESUR moderate (2+) (A) 12/12/2016 1037   Sepsis Labs: @LABRCNTIP (procalcitonin:4,lacticidven:4) )No results found for this or any previous visit (from the past 240 hour(s)).   Radiological Exams on Admission: CT Head Wo Contrast  Result Date: 04/13/2020 CLINICAL DATA:  Headache EXAM: CT HEAD WITHOUT CONTRAST TECHNIQUE: Contiguous axial images were obtained from the base of the skull through the vertex without intravenous contrast. COMPARISON:  MRI 02/20/2012 FINDINGS: Brain: No acute intracranial hemorrhage. Large hypodense mass with peripheral soft tissue density within the right temporoparietal region, measures approximately 5.2 x 4.7  x 3.7 cm. Surrounding hypodense edema within the white matter of the right temporal and parietal lobes with involvement of right basal ganglia and thalamus. Compression of the right lateral ventricle with possible mild trapped appearance of the right temporal horn. Approximately 6 mm midline shift to the left. Generalized sulcal edema on the right consistent with brain swelling. Vascular: No hyperdense vessels. Scattered carotid vascular calcification Skull: Normal. Negative for fracture or focal lesion. Sinuses/Orbits: No acute finding. Other: None IMPRESSION: 1. Large mass within the right temporoparietal region with significant surrounding edema and localized mass effect, including compression of right lateral ventricle and about 6 mm midline shift to the left. Generalized sulcal effacement in the right hemisphere consistent with edema. Mild asymmetric enlargement of the right temporal horn of the lateral ventricle. Critical Value/emergent results were called by telephone at the time of interpretation on 04/13/2020 at 9:15 pm to provider [facility] , who verbally acknowledged these results. Electronically Signed   By:  Donavan Foil M.D.   On: 04/13/2020 21:15    Assessment/Plan   1. Brain mass  - Presents with headache and nausea and is found to have large right temporoparietal mass on CT  - Neurosurgery is consulting and much appreciated, recommending MRI brain, Decadron 6 mg q6h, and then outpatient follow-up on 04/16/20 - MRI is ordered and Decadron has been started, will continue supportive care and monitoring    DVT prophylaxis: SCDs Code Status: Full   Family Communication: Husband was updated in ED   Disposition Plan:  Patient is from: Home  Anticipated d/c is to: Home  Anticipated d/c date is: 04/14/20 Patient currently: Pending MRI brain and improvement in headache  Consults called: Neurosurgery  Admission status: Observation     Vianne Bulls, MD [facility] Pager: See [email or web address]  If 7AM-7PM, please contact the daytime attending [email or web address]  04/13/2020, 11:09 PM

## 2020-04-14 ENCOUNTER — Encounter (HOSPITAL_COMMUNITY): Payer: Self-pay | Admitting: Family Medicine

## 2020-04-14 DIAGNOSIS — G939 Disorder of brain, unspecified: Secondary | ICD-10-CM | POA: Diagnosis not present

## 2020-04-14 DIAGNOSIS — R110 Nausea: Secondary | ICD-10-CM | POA: Diagnosis not present

## 2020-04-14 DIAGNOSIS — G936 Cerebral edema: Secondary | ICD-10-CM | POA: Diagnosis not present

## 2020-04-14 DIAGNOSIS — R439 Unspecified disturbances of smell and taste: Secondary | ICD-10-CM | POA: Diagnosis not present

## 2020-04-14 DIAGNOSIS — G9389 Other specified disorders of brain: Secondary | ICD-10-CM | POA: Diagnosis not present

## 2020-04-14 DIAGNOSIS — R930 Abnormal findings on diagnostic imaging of skull and head, not elsewhere classified: Secondary | ICD-10-CM | POA: Diagnosis not present

## 2020-04-14 DIAGNOSIS — Z20822 Contact with and (suspected) exposure to covid-19: Secondary | ICD-10-CM | POA: Diagnosis not present

## 2020-04-14 LAB — BASIC METABOLIC PANEL
Anion gap: 10 (ref 5–15)
BUN: 14 mg/dL (ref 8–23)
CO2: 21 mmol/L — ABNORMAL LOW (ref 22–32)
Calcium: 9.4 mg/dL (ref 8.9–10.3)
Chloride: 107 mmol/L (ref 98–111)
Creatinine, Ser: 0.75 mg/dL (ref 0.44–1.00)
GFR calc Af Amer: >60 mL/min (ref >60)
GFR calc non Af Amer: >60 mL/min (ref >60)
Glucose, Bld: 145 mg/dL — ABNORMAL HIGH (ref 70–99)
Potassium: 4.2 mmol/L (ref 3.5–5.1)
Sodium: 138 mmol/L (ref 135–145)

## 2020-04-14 LAB — CBC
HCT: 44 % (ref 36.0–46.0)
Hemoglobin: 15.2 g/dL — ABNORMAL HIGH (ref 12.0–15.0)
MCH: 31 pg (ref 26.0–34.0)
MCHC: 34.5 g/dL (ref 30.0–36.0)
MCV: 89.6 fL (ref 80.0–100.0)
Platelets: 142 10*3/uL — ABNORMAL LOW (ref 150–400)
RBC: 4.91 MIL/uL (ref 3.87–5.11)
RDW: 11.7 % (ref 11.5–15.5)
WBC: 3.3 10*3/uL — ABNORMAL LOW (ref 4.0–10.5)
nRBC: 0 % (ref 0.0–0.2)

## 2020-04-14 LAB — HIV ANTIBODY (ROUTINE TESTING W REFLEX): HIV Screen 4th Generation wRfx: NONREACTIVE

## 2020-04-14 LAB — SARS CORONAVIRUS 2 BY RT PCR (HOSPITAL ORDER, PERFORMED IN ~~LOC~~ HOSPITAL LAB): SARS Coronavirus 2: NEGATIVE

## 2020-04-14 MED ORDER — SENNOSIDES-DOCUSATE SODIUM 8.6-50 MG PO TABS: 1.0000 | ORAL_TABLET | Freq: Every evening | ORAL | Status: DC | PRN

## 2020-04-14 MED ORDER — GADOBUTROL 1 MMOL/ML IV SOLN
7.5000 mL | Freq: Once | INTRAVENOUS | Status: AC | PRN
  Administered 2020-04-14: 7.5 mL via INTRAVENOUS

## 2020-04-14 MED ORDER — MELATONIN 3 MG PO TABS: 3.0000 mg | ORAL_TABLET | Freq: Every evening | ORAL | 0 refills | Status: AC | PRN

## 2020-04-14 MED ORDER — SODIUM CHLORIDE 0.9% FLUSH: 3.0000 mL | INTRAVENOUS | Status: DC | PRN

## 2020-04-14 MED ORDER — PANTOPRAZOLE SODIUM 40 MG PO TBEC: 40.0000 mg | DELAYED_RELEASE_TABLET | Freq: Every day | ORAL | 0 refills | Status: DC

## 2020-04-14 MED ORDER — ACETAMINOPHEN 325 MG PO TABS: 650.0000 mg | ORAL_TABLET | Freq: Four times a day (QID) | ORAL | Status: DC | PRN

## 2020-04-14 MED ORDER — TRAMADOL HCL 50 MG PO TABS: 50.0000 mg | ORAL_TABLET | Freq: Three times a day (TID) | ORAL | 0 refills | Status: DC | PRN

## 2020-04-14 MED ORDER — PROMETHAZINE HCL 25 MG PO TABS: 12.5000 mg | ORAL_TABLET | Freq: Four times a day (QID) | ORAL | Status: DC | PRN

## 2020-04-14 MED ORDER — SODIUM CHLORIDE 0.9% FLUSH
3.0000 mL | Freq: Two times a day (BID) | INTRAVENOUS | Status: DC
  Administered 2020-04-14: 3 mL via INTRAVENOUS

## 2020-04-14 MED ORDER — SODIUM CHLORIDE 0.9 % IV SOLN: 250.0000 mL | INTRAVENOUS | Status: DC | PRN

## 2020-04-14 MED ORDER — ACETAMINOPHEN 325 MG PO TABS: 650.0000 mg | ORAL_TABLET | Freq: Four times a day (QID) | ORAL | Status: AC | PRN

## 2020-04-14 MED ORDER — DEXAMETHASONE 4 MG PO TABS
4.0000 mg | ORAL_TABLET | Freq: Four times a day (QID) | ORAL | 0 refills | Status: DC
Start: 2020-04-14 — End: 2020-04-21

## 2020-04-14 MED ORDER — ACETAMINOPHEN 650 MG RE SUPP: 650.0000 mg | Freq: Four times a day (QID) | RECTAL | Status: DC | PRN

## 2020-04-14 MED ORDER — BISACODYL 5 MG PO TBEC: 5.0000 mg | DELAYED_RELEASE_TABLET | Freq: Every day | ORAL | Status: DC | PRN

## 2020-04-14 NOTE — Progress Notes (Signed)
Patient ID: Terry Bell, female   DOB: 24-Jan-1956, 64 y.o.   MRN: [id number] BP 119/82 (BP Location: Left Arm)   Pulse 81   Temp 98.8 F (37.1 C) (Oral)   Resp 18   Ht 5' 6"  (1.676 m)   Wt 80.4 kg   SpO2 97%   BMI 28.61 kg/m  I spoke with [name] and her family. The MRI which strongly suggests a GBM, was described and explained to the family. I will schedule surgery for next week and inform her of the date. I went over the risks and benefits of surgery the expected post op treatment plan, she understands and wishes to proceed.

## 2020-04-14 NOTE — Plan of Care (Signed)

## 2020-04-14 NOTE — Discharge Summary (Signed)
Physician Discharge Summary  Terry Bell ALP:[id number] DOB: October 22, 1956 DOA: 04/13/2020  PCP: [place], Betty G, MD  Admit date: 04/13/2020 Discharge date: 04/14/2020  Admitted From: Home Discharge disposition: Home   Code Status: Full Code  Diet Recommendation: Regular diet  Recommendations for Outpatient Follow-Up:   1. Follow-up with neurosurgeon as an outpatient  Discharge Diagnosis:   Principal Problem:   Brain mass Active Problems:   Headache  History of Present Illness / Brief narrative:  Terry Bell a 64 y.o.female, longtime [employer] employeewith PMH significant forNASH. Patient presented to the ED on 6/18 for evaluation of new headaches and change in her taste.  Headache progressively worsened in severity described as pressure sensation in the front of her head.  In the ED, vital signs stable, CBC and chemistry unremarkable. CT head showed large mass in the right temporoparietal region with significant surrounding edema and localized mass-effect.  Neurosurgery was consulted and patient was started on Decadron 6 mg every 6 hours. Patient was admitted to hospitalist service for further evaluation management. 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 Surrounding T2/FLAIR hyperintensity compatible with vasogenic edema and/or infiltrating nonenhancing tumor. Associated regional mass effect with 8 mm of right-to-left midline shift. Dilatation of the right temporal horn without transependymal flow of CSF.  Hospital Course:  Brain mass -Presented with headache and loss of taste.   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 -Neurosurgery consultation was obtained.  Currently on Decadron 6 mg every 6 hours. -Patient states her headache is controlled now.  -We will discharge the patient on  Decadron per neurosurgery recommendation. -We will also keep patient on Protonix daily and PRN melatonin nightly while on Decadron.  As needed Tylenol and as needed tramadol advised.   -Patient will follow up with neurosurgeon as an outpatient early next week.  Decadron dose may need to be adjusted at that time.  NASH -Very strong family history of NASH, cirrhosis and primary liver cancer.   -Follows up with PCP on regular intervals.  Stable for discharge today.  Subjective:  Seen and examined this morning.  Very pleasant middle-aged Caucasian female. Slightly tearful and emotional because of unfortunate diagnosis. Family at bedside.  Very supportive. States her headache is improved today.  Discharge Exam:   Vitals:   04/14/20 0145 04/14/20 0212 04/14/20 0356 04/14/20 1001  BP: 102/66 133/85 95/60 119/82  Pulse: 65 66 64 81  Resp: 17 17 17 18   Temp: 98.1 F (36.7 C) 98.6 F (37 C) 98.6 F (37 C) 98.8 F (37.1 C)  TempSrc: Axillary Axillary Oral Oral  SpO2: 95% 95% 95% 97%  Weight:  80.4 kg    Height:  5' 6"  (1.676 m)      Body mass index is 28.61 kg/m.  General exam: Appears calm and comfortable.  Not in physical distress Skin: No rashes, lesions or ulcers. HEENT: Atraumatic, normocephalic, supple neck, no obvious bleeding Lungs: Clear to auscultation bilaterally CVS: Regular rate and rhythm, no murmur GI/Abd soft, nontender, nondistended, bowel sound present CNS: Alert, awake, oriented x3 Psychiatry: sad Extremities: No pedal edema, no calf tenderness  Discharge Instructions:  Wound care: None  Discharge Instructions    Diet general   Complete by: As directed    Increase activity slowly   Complete by: As directed  Follow-up Information    Ashok Pall, MD. Call in 2 day(s).   Specialty: Neurosurgery Contact information: [address] [address] [phone]        [place], Betty G, MD Follow up.   Specialty: Family  Medicine Contact information: [place] [address] [phone]              Allergies as of 04/14/2020      Reactions   Niacin Hives, Rash      Medication List    TAKE these medications   acetaminophen 325 MG tablet Commonly known as: TYLENOL Take 2 tablets (650 mg total) by mouth every 6 (six) hours as needed for up to 14 days for mild pain (or Fever >/= 101).   Align 4 MG Caps Take 4 mg by mouth daily.   dexamethasone 4 MG tablet Commonly known as: DECADRON Take 1 tablet (4 mg total) by mouth 4 (four) times daily for 21 days.   FISH OIL PO Take 1,000 mg by mouth daily.   melatonin 3 MG Tabs tablet Take 1 tablet (3 mg total) by mouth at bedtime as needed for up to 14 days.   pantoprazole 40 MG tablet Commonly known as: Protonix Take 1 tablet (40 mg total) by mouth daily for 14 days.   traMADol 50 MG tablet Commonly known as: Ultram Take 1 tablet (50 mg total) by mouth every 8 (eight) hours as needed for up to 5 days.   TURMERIC PO Take 538 mg by mouth daily.   Vitamin D 1000 units capsule Take 1,000 Units by mouth 2 (two) times daily.   vitamin E 180 MG (400 UNITS) capsule Take 800 Units by mouth.       Time coordinating discharge: 35 minutes  The results of significant diagnostics from this hospitalization (including imaging, microbiology, ancillary and laboratory) are listed below for reference.    Procedures and Diagnostic Studies:   CT Head Wo Contrast  Result Date: 04/13/2020 CLINICAL DATA:  Headache EXAM: CT HEAD WITHOUT CONTRAST TECHNIQUE: Contiguous axial images were obtained from the base of the skull through the vertex without intravenous contrast. COMPARISON:  MRI 02/20/2012 FINDINGS: Brain: No acute intracranial hemorrhage. Large hypodense mass with peripheral soft tissue density within the right temporoparietal region, measures approximately 5.2 x 4.7 x 3.7 cm. Surrounding hypodense edema within the white matter of  the right temporal and parietal lobes with involvement of right basal ganglia and thalamus. Compression of the right lateral ventricle with possible mild trapped appearance of the right temporal horn. Approximately 6 mm midline shift to the left. Generalized sulcal edema on the right consistent with brain swelling. Vascular: No hyperdense vessels. Scattered carotid vascular calcification Skull: Normal. Negative for fracture or focal lesion. Sinuses/Orbits: No acute finding. Other: None IMPRESSION: 1. Large mass within the right temporoparietal region with significant surrounding edema and localized mass effect, including compression of right lateral ventricle and about 6 mm midline shift to the left. Generalized sulcal effacement in the right hemisphere consistent with edema. Mild asymmetric enlargement of the right temporal horn of the lateral ventricle. Critical Value/emergent results were called by telephone at the time of interpretation on 04/13/2020 at 9:15 pm to provider [place] , who verbally acknowledged these results. Electronically Signed   By: Donavan Foil M.D.   On: 04/13/2020 21:15   MR Brain W and Wo Contrast  Result Date: 04/14/2020 CLINICAL DATA:  Initial evaluation for brain mass, headache. 6.6 x  4.9 x 6.0 EXAM: MRI HEAD WITHOUT AND WITH CONTRAST TECHNIQUE: Multiplanar, multiecho pulse sequences of the brain and surrounding structures were obtained without and with intravenous contrast. CONTRAST:  7.75m GADAVIST GADOBUTROL 1 MMOL/ML IV SOLN COMPARISON:  Prior CT from 04/13/2020. FINDINGS: Brain: Cerebral volume within normal limits for age. No significant cerebral white matter disease. Heterogeneous predominant T2 hyperintense mass centered at the right periatrial region is seen. Lesion measures 6.6 x 4.9 x 6.0 cm in greatest dimensions (AP by transverse by craniocaudad). Lesion demonstrates heterogeneous T2 hyperintense signal intensity with avid irregular in predominant peripheral  postcontrast enhancement. Scattered areas of internal restricted diffusion and susceptibility artifact consistent with necrosis. Polypoid extension of the lesion towards the overlying dura seen at the lateral margin of the lesion (series 12, image 24). Surrounding T2/FLAIR hyperintensity compatible with vasogenic edema and/or infiltrating nonenhancing tumor. Mass effect on the adjacent right lateral ventricle which is partially effaced and compressed. No definite ependymal extension, although difficult to be certain given mass effect an adjacent enhancing choroid plexus. Associated right-to-left midline shift measures up to 8 mm. Focal dilatation of the temporal horn of the right lateral ventricle without transependymal flow of CSF. No hydrocephalus. No other abnormal enhancement within the brain. Small. Callosum lipoma noted. No other discrete mass lesion or mass effect. No evidence for acute or subacute infarct. Gray-white matter differentiation maintained elsewhere throughout the brain. No other areas of chronic infarction. No other foci of susceptibility artifact to suggest acute or chronic intracranial hemorrhage. Pituitary gland within normal limits. Vascular: Major intracranial vascular flow voids are maintained. Skull and upper cervical spine: Craniocervical junction within normal limits. Upper cervical spine normal. Bone marrow signal intensity within normal limits. No scalp soft tissue abnormality. Sinuses/Orbits: Globes and orbital soft tissues within normal limits. Paranasal sinuses are largely clear. No significant mastoid effusion. Inner ear structures grossly normal. Other: None. IMPRESSION: 1. 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 Surrounding T2/FLAIR hyperintensity compatible with vasogenic edema and/or infiltrating nonenhancing tumor. Associated regional mass effect with 8 mm of right-to-left midline shift. Dilatation of the  right temporal horn without transependymal flow of CSF. 2. Otherwise unremarkable brain MRI. No other acute intracranial abnormality identified. Electronically Signed   By: BJeannine BogaM.D.   On: 04/14/2020 00:52     Labs:   Basic Metabolic Panel: Recent Labs  Lab 04/13/20 2148 04/14/20 0444  NA 137 138  K 4.0 4.2  CL 106 107  CO2 22 21*  GLUCOSE 107* 145*  BUN 18 14  CREATININE 0.84 0.75  CALCIUM 9.3 9.4   GFR Estimated Creatinine Clearance: 76.9 mL/min (by C-G formula based on SCr of 0.75 mg/dL). Liver Function Tests: No results for input(s): AST, ALT, ALKPHOS, BILITOT, PROT, ALBUMIN in the last 168 hours. No results for input(s): LIPASE, AMYLASE in the last 168 hours. No results for input(s): AMMONIA in the last 168 hours. Coagulation profile Recent Labs  Lab 04/13/20 2148  INR 1.0    CBC: Recent Labs  Lab 04/13/20 2148 04/14/20 0444  WBC 5.3 3.3*  NEUTROABS 2.7  --   HGB 15.1* 15.2*  HCT 44.2 44.0  MCV 90.9 89.6  PLT 158 142*   Cardiac Enzymes: No results for input(s): CKTOTAL, CKMB, CKMBINDEX, TROPONINI in the last 168 hours. BNP: Invalid input(s): POCBNP CBG: No results for input(s): GLUCAP in the last 168 hours. D-Dimer No results for input(s): DDIMER in the last 72 hours. Hgb  A1c No results for input(s): HGBA1C in the last 72 hours. Lipid Profile No results for input(s): CHOL, HDL, LDLCALC, TRIG, CHOLHDL, LDLDIRECT in the last 72 hours. Thyroid function studies No results for input(s): TSH, T4TOTAL, T3FREE, THYROIDAB in the last 72 hours.  Invalid input(s): FREET3 Anemia work up No results for input(s): VITAMINB12, FOLATE, FERRITIN, TIBC, IRON, RETICCTPCT in the last 72 hours. Microbiology Recent Results (from the past 240 hour(s))  SARS Coronavirus 2 by RT PCR (hospital order, performed in [facility] hospital lab) Nasopharyngeal Nasopharyngeal Swab     Status: None   Collection Time: 04/13/20 10:04 PM   Specimen: Nasopharyngeal  Swab  Result Value Ref Range Status   SARS Coronavirus 2 NEGATIVE NEGATIVE Final    Comment: (NOTE) SARS-CoV-2 target nucleic acids are NOT DETECTED.  The SARS-CoV-2 RNA is generally detectable in upper and lower respiratory specimens during the acute phase of infection. The lowest concentration of SARS-CoV-2 viral copies this assay can detect is 250 copies / mL. A negative result does not preclude SARS-CoV-2 infection and should not be used as the sole basis for treatment or other patient management decisions.  A negative result may occur with improper specimen collection / handling, submission of specimen other than nasopharyngeal swab, presence of viral mutation(s) within the areas targeted by this assay, and inadequate number of viral copies (<250 copies / mL). A negative result must be combined with clinical observations, patient history, and epidemiological information.  Fact Sheet for Patients:   StrictlyIdeas.no  Fact Sheet for Healthcare Providers: BankingDealers.co.za  This test is not yet approved or  cleared by the [place] FDA and has been authorized for detection and/or diagnosis of SARS-CoV-2 by FDA under an Emergency Use Authorization (EUA).  This EUA will remain in effect (meaning this test can be used) for the duration of the COVID-19 declaration under Section 564(b)(1) of the Act, 21 U.S.C. section 360bbb-3(b)(1), unless the authorization is terminated or revoked sooner.  Performed at [facility], [place] [address] [place], [place] [address]     Please note: You were cared for by a hospitalist during your hospital stay. Once you are discharged, your primary care physician will handle any further medical issues. Please note that NO REFILLS for any discharge medications will be authorized once you are discharged, as it is imperative that you return to your primary care physician (or establish a relationship  with a primary care physician if you do not have one) for your post hospital discharge needs so that they can reassess your need for medications and monitor your lab values.  Signed: Terrilee Croak  [facility] 04/14/2020, 3:36 PM

## 2020-04-14 NOTE — Progress Notes (Signed)
Discharge instructions reviewed with patient and husband.  These included, but were not limited to, the following:  When to call the MD, follow-up appointments, plan of action, scheduled medications and next due dose.  Questions were answered to their satisfaction AEB use of "teach-back" technique.  Emotional support was given.  Patient stated has excellent support system with family, friends, and church.

## 2020-04-16 ENCOUNTER — Encounter (HOSPITAL_COMMUNITY): Payer: Self-pay | Admitting: Neurosurgery

## 2020-04-16 ENCOUNTER — Other Ambulatory Visit: Payer: Self-pay | Admitting: Neurosurgery

## 2020-04-17 ENCOUNTER — Encounter (HOSPITAL_COMMUNITY): Payer: Self-pay | Admitting: Neurosurgery

## 2020-04-17 ENCOUNTER — Encounter (HOSPITAL_COMMUNITY)
Admission: RE | Admit: 2020-04-17 | Discharge: 2020-04-17 | Disposition: A | Discharge status: Home / Self Care | Payer: 59 | Source: Ambulatory Visit | Attending: Neurosurgery | Admitting: Neurosurgery

## 2020-04-17 ENCOUNTER — Other Ambulatory Visit (HOSPITAL_COMMUNITY): Payer: Self-pay | Admitting: Neurosurgery

## 2020-04-17 ENCOUNTER — Other Ambulatory Visit: Payer: Self-pay

## 2020-04-17 ENCOUNTER — Encounter (HOSPITAL_COMMUNITY): Payer: Self-pay

## 2020-04-17 DIAGNOSIS — D496 Neoplasm of unspecified behavior of brain: Secondary | ICD-10-CM

## 2020-04-17 DIAGNOSIS — Z20822 Contact with and (suspected) exposure to covid-19: Secondary | ICD-10-CM | POA: Diagnosis not present

## 2020-04-17 DIAGNOSIS — Z79899 Other long term (current) drug therapy: Secondary | ICD-10-CM | POA: Diagnosis not present

## 2020-04-17 DIAGNOSIS — Z808 Family history of malignant neoplasm of other organs or systems: Secondary | ICD-10-CM | POA: Diagnosis not present

## 2020-04-17 DIAGNOSIS — Z01812 Encounter for preprocedural laboratory examination: Secondary | ICD-10-CM | POA: Insufficient documentation

## 2020-04-17 DIAGNOSIS — H5347 Heteronymous bilateral field defects: Secondary | ICD-10-CM | POA: Diagnosis not present

## 2020-04-17 DIAGNOSIS — C719 Malignant neoplasm of brain, unspecified: Secondary | ICD-10-CM | POA: Diagnosis not present

## 2020-04-17 DIAGNOSIS — R519 Headache, unspecified: Secondary | ICD-10-CM | POA: Diagnosis not present

## 2020-04-17 DIAGNOSIS — C713 Malignant neoplasm of parietal lobe: Secondary | ICD-10-CM | POA: Diagnosis not present

## 2020-04-17 DIAGNOSIS — G4730 Sleep apnea, unspecified: Secondary | ICD-10-CM | POA: Diagnosis not present

## 2020-04-17 DIAGNOSIS — Z833 Family history of diabetes mellitus: Secondary | ICD-10-CM | POA: Diagnosis not present

## 2020-04-17 DIAGNOSIS — C712 Malignant neoplasm of temporal lobe: Secondary | ICD-10-CM | POA: Diagnosis not present

## 2020-04-17 DIAGNOSIS — Z7952 Long term (current) use of systemic steroids: Secondary | ICD-10-CM | POA: Diagnosis not present

## 2020-04-17 HISTORY — DX: Liver disease, unspecified: K76.9

## 2020-04-17 LAB — HEPATIC FUNCTION PANEL
ALT: 47 U/L — ABNORMAL HIGH (ref 0–44)
AST: 26 U/L (ref 15–41)
Albumin: 4.2 g/dL (ref 3.5–5.0)
Alkaline Phosphatase: 45 U/L (ref 38–126)
Bilirubin, Direct: 0.2 mg/dL (ref 0.0–0.2)
Indirect Bilirubin: 1.1 mg/dL — ABNORMAL HIGH (ref 0.3–0.9)
Total Bilirubin: 1.3 mg/dL — ABNORMAL HIGH (ref 0.3–1.2)
Total Protein: 7 g/dL (ref 6.5–8.1)

## 2020-04-17 LAB — ABO/RH: ABO/RH(D): A POS

## 2020-04-17 LAB — TYPE AND SCREEN
ABO/RH(D): A POS
Antibody Screen: NEGATIVE

## 2020-04-17 LAB — PROTIME-INR
INR: 1.1 (ref 0.8–1.2)
Prothrombin Time: 13.7 seconds (ref 11.4–15.2)

## 2020-04-17 NOTE — Progress Notes (Addendum)
Aubrea Meixner denies chest pain or shortness of breath. Patient denies s/s of Covid and has not been in contact with anyone with s/s.  Jalyiah will have covid test on 04/18/20 in the afternoon and then quarantine with family.  PCP - Dr.Betty Jordon  Gastroenterologist: Dr Eddie Dibbles Schmeltzer  Cardiologist - none  Chest x-ray - na  EKG - na  Stress Test - na  ECHO - na  Cardiac Cath - na  Sleep Study - Yes, 2017  CPAP - no- has a mouth appliance- not using  LABS-CBC, BMP- 04/14/2020  ASA-no  ERAS-no  HA1C-na Fasting Blood Sugar - na Checks Blood Sugar ___na__ times a day  Anesthesia-  Pt denies having chest pain, sob, or fever at this time. All instructions explained to the pt, with a verbal understanding of the material. Pt agrees to go over the instructions while at home for a better understanding. Pt also instructed to self quarantine after being tested for COVID-19. The opportunity to ask questions was provided.

## 2020-04-17 NOTE — Pre-Procedure Instructions (Signed)
Terry Bell  04/17/2020    Your procedure is scheduled on Thursday, June 24..  Report to [facility], Main Entrance or Entrance "A" at 10 AM                  Your surgery or procedure is scheduled for 1200 noon - 2:55 PM   Call this number if you have problems the morning of surgery: [phone]  This is the number for the Pre- Surgical Desk.                    For any other questions, please call [phone], Monday - Friday 8 AM - 4 PM.     Remember:  Do not eat or drink after midnight Wednesday, June 23.   Take these medicines the morning of surgery with A SIP OF WATER :  dexamethasone (DECADRON)             pantoprazole (PROTONIX)   Take if needed: cetirizine (ZYRTEC)     acetaminophen (TYLENOL)      traMADol Terry Bell)  Special instructions:   [place]- Preparing For Surgery  Before surgery, you can play an important role. Because skin is not sterile, your skin needs to be as free of germs as possible. You can reduce the number of germs on your skin by washing with CHG (chlorahexidine gluconate) Soap before surgery.  CHG is an antiseptic cleaner which kills germs and bonds with the skin to continue killing germs even after washing.    Oral Hygiene is also important to reduce your risk of infection.  Remember - BRUSH YOUR TEETH THE MORNING OF SURGERY WITH YOUR REGULAR TOOTHPASTE  Please do not use if you have an allergy to CHG or antibacterial soaps. If your skin becomes reddened/irritated stop using the CHG.  Do not shave (including legs and underarms) for at least 48 hours prior to first CHG shower. It is OK to shave your face.  Please follow these instructions carefully.   1. Shower the NIGHT BEFORE SURGERY and the MORNING OF SURGERY with CHG.   2. If you chose to wash your hair, wash your hair first as usual with your normal shampoo.  3. After you shampoo, wash your face and private area with the soap you use at home, then rinse your hair and  body thoroughly to remove the shampoo and soap.   4. Use CHG as you would any other liquid soap. You can apply CHG directly to the skin and wash gently with a scrungie or a clean washcloth.   5. Apply the CHG Soap to your body ONLY FROM THE NECK DOWN.  Do not use on open wounds or open sores. Avoid contact with your eyes, ears, mouth and genitals (private parts).   6. Wash thoroughly, paying special attention to the area where your surgery will be performed.  7. Thoroughly rinse your body with warm water from the neck down.  8. DO NOT shower/wash with your normal soap after using and rinsing off the CHG Soap.  9. Pat yourself dry with a CLEAN TOWEL.  10. Wear CLEAN PAJAMAS to bed the night before surgery, wear comfortable clothes the morning of surgery  11. Place CLEAN SHEETS on your bed the night of your first shower and DO NOT SLEEP WITH PETS.   Day of Surgery:  Do not wear lotions, powders, or perfumes, or deodorant.  Please wear clean clothes to the hospital/surgery center.  Remember to brush your teeth WITH YOUR REGULAR TOOTHPASTE.    Do not wear jewelry, make-up or nail polish.  Do not shave 48 hours prior to surgery.    Do not bring valuables to the hospital.  [facility] is not responsible for any belongings or valuables.  Contacts, dentures or bridgework may not be worn into surgery.  Leave your suitcase in the car.  After surgery it may be brought to your room.  For patients admitted to the hospital, discharge time will be determined by your treatment team.  Patients discharged the day of surgery will not be allowed to drive home.   Please read over the following fact sheets that you were given: Coughing and Deep Breathing, Pain Booklet,and Surgical Site Infections

## 2020-04-18 ENCOUNTER — Other Ambulatory Visit: Payer: 59

## 2020-04-18 ENCOUNTER — Other Ambulatory Visit (HOSPITAL_COMMUNITY)
Admission: RE | Admit: 2020-04-18 | Discharge: 2020-04-18 | Disposition: A | Discharge status: Home / Self Care | Payer: 59 | Source: Ambulatory Visit | Attending: Neurosurgery | Admitting: Neurosurgery

## 2020-04-18 ENCOUNTER — Other Ambulatory Visit: Payer: Self-pay

## 2020-04-18 ENCOUNTER — Ambulatory Visit (HOSPITAL_COMMUNITY)
Admission: RE | Admit: 2020-04-18 | Discharge: 2020-04-18 | Disposition: A | Discharge status: Home / Self Care | Payer: 59 | Source: Ambulatory Visit | Attending: Neurosurgery | Admitting: Neurosurgery

## 2020-04-18 DIAGNOSIS — Z20822 Contact with and (suspected) exposure to covid-19: Secondary | ICD-10-CM | POA: Insufficient documentation

## 2020-04-18 DIAGNOSIS — D496 Neoplasm of unspecified behavior of brain: Secondary | ICD-10-CM

## 2020-04-18 DIAGNOSIS — C719 Malignant neoplasm of brain, unspecified: Secondary | ICD-10-CM | POA: Diagnosis not present

## 2020-04-18 DIAGNOSIS — Z01812 Encounter for preprocedural laboratory examination: Secondary | ICD-10-CM | POA: Insufficient documentation

## 2020-04-18 DIAGNOSIS — R519 Headache, unspecified: Secondary | ICD-10-CM | POA: Diagnosis not present

## 2020-04-18 LAB — SARS CORONAVIRUS 2 (TAT 6-24 HRS): SARS Coronavirus 2: NEGATIVE

## 2020-04-18 MED ORDER — GADOBUTROL 1 MMOL/ML IV SOLN
7.6000 mL | Freq: Once | INTRAVENOUS | Status: AC | PRN
  Administered 2020-04-18: 7.6 mL via INTRAVENOUS

## 2020-04-19 ENCOUNTER — Inpatient Hospital Stay (HOSPITAL_COMMUNITY)
Admission: RE | Admit: 2020-04-19 | Discharge: 2020-04-21 | DRG: 027 | Disposition: A | Discharge status: Home / Self Care | Payer: 59 | Attending: Neurosurgery | Admitting: Neurosurgery

## 2020-04-19 ENCOUNTER — Encounter (HOSPITAL_COMMUNITY): Payer: Self-pay | Admitting: Neurosurgery

## 2020-04-19 ENCOUNTER — Inpatient Hospital Stay (HOSPITAL_COMMUNITY): Payer: 59 | Admitting: Certified Registered"

## 2020-04-19 ENCOUNTER — Other Ambulatory Visit: Payer: Self-pay

## 2020-04-19 ENCOUNTER — Inpatient Hospital Stay (HOSPITAL_COMMUNITY)
Admission: RE | Disposition: A | Discharge status: Home / Self Care | Payer: Self-pay | Source: Home / Self Care | Attending: Neurosurgery

## 2020-04-19 DIAGNOSIS — Z833 Family history of diabetes mellitus: Secondary | ICD-10-CM

## 2020-04-19 DIAGNOSIS — C713 Malignant neoplasm of parietal lobe: Secondary | ICD-10-CM | POA: Diagnosis present

## 2020-04-19 DIAGNOSIS — Z7952 Long term (current) use of systemic steroids: Secondary | ICD-10-CM

## 2020-04-19 DIAGNOSIS — Z20822 Contact with and (suspected) exposure to covid-19: Secondary | ICD-10-CM | POA: Diagnosis present

## 2020-04-19 DIAGNOSIS — H5347 Heteronymous bilateral field defects: Secondary | ICD-10-CM | POA: Diagnosis present

## 2020-04-19 DIAGNOSIS — G4730 Sleep apnea, unspecified: Secondary | ICD-10-CM | POA: Diagnosis present

## 2020-04-19 DIAGNOSIS — Z8249 Family history of ischemic heart disease and other diseases of the circulatory system: Secondary | ICD-10-CM

## 2020-04-19 DIAGNOSIS — Z79899 Other long term (current) drug therapy: Secondary | ICD-10-CM

## 2020-04-19 DIAGNOSIS — G4733 Obstructive sleep apnea (adult) (pediatric): Secondary | ICD-10-CM | POA: Diagnosis not present

## 2020-04-19 DIAGNOSIS — Z808 Family history of malignant neoplasm of other organs or systems: Secondary | ICD-10-CM | POA: Diagnosis not present

## 2020-04-19 DIAGNOSIS — D496 Neoplasm of unspecified behavior of brain: Secondary | ICD-10-CM | POA: Diagnosis not present

## 2020-04-19 DIAGNOSIS — C718 Malignant neoplasm of overlapping sites of brain: Secondary | ICD-10-CM | POA: Diagnosis not present

## 2020-04-19 DIAGNOSIS — C719 Malignant neoplasm of brain, unspecified: Secondary | ICD-10-CM | POA: Diagnosis present

## 2020-04-19 DIAGNOSIS — C7931 Secondary malignant neoplasm of brain: Secondary | ICD-10-CM | POA: Diagnosis not present

## 2020-04-19 DIAGNOSIS — G936 Cerebral edema: Secondary | ICD-10-CM | POA: Diagnosis not present

## 2020-04-19 DIAGNOSIS — K7581 Nonalcoholic steatohepatitis (NASH): Secondary | ICD-10-CM | POA: Diagnosis not present

## 2020-04-19 DIAGNOSIS — C712 Malignant neoplasm of temporal lobe: Principal | ICD-10-CM | POA: Diagnosis present

## 2020-04-19 DIAGNOSIS — C711 Malignant neoplasm of frontal lobe: Secondary | ICD-10-CM | POA: Diagnosis not present

## 2020-04-19 HISTORY — DX: Other specified postprocedural states: Z98.890

## 2020-04-19 HISTORY — PX: CRANIOTOMY: SHX93

## 2020-04-19 HISTORY — PX: APPLICATION OF CRANIAL NAVIGATION: SHX6578

## 2020-04-19 HISTORY — DX: Other complications of anesthesia, initial encounter: T88.59XA

## 2020-04-19 HISTORY — DX: Other specified postprocedural states: R11.2

## 2020-04-19 LAB — POCT I-STAT 7, (LYTES, BLD GAS, ICA,H+H)
Acid-base deficit: 11 mmol/L — ABNORMAL HIGH (ref 0.0–2.0)
Acid-base deficit: 11 mmol/L — ABNORMAL HIGH (ref 0.0–2.0)
Acid-base deficit: 5 mmol/L — ABNORMAL HIGH (ref 0.0–2.0)
Bicarbonate: 11.7 mmol/L — ABNORMAL LOW (ref 20.0–28.0)
Bicarbonate: 12.8 mmol/L — ABNORMAL LOW (ref 20.0–28.0)
Bicarbonate: 20.4 mmol/L (ref 20.0–28.0)
Calcium, Ion: 0.81 mmol/L — CL (ref 1.15–1.40)
Calcium, Ion: 0.84 mmol/L — CL (ref 1.15–1.40)
Calcium, Ion: 1.07 mmol/L — ABNORMAL LOW (ref 1.15–1.40)
HCT: 24 % — ABNORMAL LOW (ref 36.0–46.0)
HCT: 25 % — ABNORMAL LOW (ref 36.0–46.0)
HCT: 35 % — ABNORMAL LOW (ref 36.0–46.0)
Hemoglobin: 8.2 g/dL — ABNORMAL LOW (ref 12.0–15.0)
Hemoglobin: 8.5 g/dL — ABNORMAL LOW (ref 12.0–15.0)
Hemoglobin: 11.9 g/dL — ABNORMAL LOW (ref 12.0–15.0)
O2 Saturation: 100 %
O2 Saturation: 100 %
O2 Saturation: 100 %
Patient temperature: 35
Patient temperature: 35
Potassium: 2.2 mmol/L — CL (ref 3.5–5.1)
Potassium: 2.3 mmol/L — CL (ref 3.5–5.1)
Potassium: 3.4 mmol/L — ABNORMAL LOW (ref 3.5–5.1)
Sodium: 147 mmol/L — ABNORMAL HIGH (ref 135–145)
Sodium: 148 mmol/L — ABNORMAL HIGH (ref 135–145)
Sodium: 143 mmol/L (ref 135–145)
TCO2: 12 mmol/L — ABNORMAL LOW (ref 22–32)
TCO2: 13 mmol/L — ABNORMAL LOW (ref 22–32)
TCO2: 22 mmol/L (ref 22–32)
pCO2 arterial: 17.7 mmHg — CL (ref 32.0–48.0)
pCO2 arterial: 19.6 mmHg — CL (ref 32.0–48.0)
pCO2 arterial: 34.7 mmHg (ref 32.0–48.0)
pH, Arterial: 7.413 (ref 7.350–7.450)
pH, Arterial: 7.427 (ref 7.350–7.450)
pH, Arterial: 7.367 (ref 7.350–7.450)
pO2, Arterial: 199 mmHg — ABNORMAL HIGH (ref 83.0–108.0)
pO2, Arterial: 225 mmHg — ABNORMAL HIGH (ref 83.0–108.0)
pO2, Arterial: 329 mmHg — ABNORMAL HIGH (ref 83.0–108.0)

## 2020-04-19 LAB — CBC
HCT: 42 % (ref 36.0–46.0)
Hemoglobin: 14.8 g/dL (ref 12.0–15.0)
MCH: 31.4 pg (ref 26.0–34.0)
MCHC: 35.2 g/dL (ref 30.0–36.0)
MCV: 89 fL (ref 80.0–100.0)
Platelets: 159 10*3/uL (ref 150–400)
RBC: 4.72 MIL/uL (ref 3.87–5.11)
RDW: 11.8 % (ref 11.5–15.5)
WBC: 10.2 10*3/uL (ref 4.0–10.5)
nRBC: 0 % (ref 0.0–0.2)

## 2020-04-19 LAB — CREATININE, SERUM
Creatinine, Ser: 0.67 mg/dL (ref 0.44–1.00)
GFR calc Af Amer: >60 mL/min (ref >60)
GFR calc non Af Amer: >60 mL/min (ref >60)

## 2020-04-19 LAB — POCT I-STAT, CHEM 8
BUN: 13 mg/dL (ref 8–23)
Calcium, Ion: 0.86 mmol/L — CL (ref 1.15–1.40)
Chloride: 116 mmol/L — ABNORMAL HIGH (ref 98–111)
Creatinine, Ser: <0.2 mg/dL — ABNORMAL LOW (ref 0.44–1.00)
Glucose, Bld: 79 mg/dL (ref 70–99)
HCT: 28 % — ABNORMAL LOW (ref 36.0–46.0)
Hemoglobin: 9.5 g/dL — ABNORMAL LOW (ref 12.0–15.0)
Potassium: 2.3 mmol/L — CL (ref 3.5–5.1)
Sodium: 146 mmol/L — ABNORMAL HIGH (ref 135–145)
TCO2: 13 mmol/L — ABNORMAL LOW (ref 22–32)

## 2020-04-19 LAB — MRSA PCR SCREENING: MRSA by PCR: NEGATIVE

## 2020-04-19 SURGERY — CRANIOTOMY TUMOR EXCISION
Anesthesia: General | Site: Head | Laterality: Right

## 2020-04-19 MED ORDER — PROMETHAZINE HCL 12.5 MG PO TABS
12.5000 mg | ORAL_TABLET | ORAL | Status: DC | PRN
  Filled 2020-04-19: qty 2

## 2020-04-19 MED ORDER — PROPOFOL 10 MG/ML IV BOLUS
INTRAVENOUS | Status: DC | PRN
  Administered 2020-04-19: 150 mg via INTRAVENOUS
  Administered 2020-04-19 (×3): 10 mg via INTRAVENOUS
  Administered 2020-04-19: 50 mg via INTRAVENOUS

## 2020-04-19 MED ORDER — ROCURONIUM BROMIDE 10 MG/ML (PF) SYRINGE
PREFILLED_SYRINGE | INTRAVENOUS | Status: DC | PRN
  Administered 2020-04-19 (×2): 20 mg via INTRAVENOUS
  Administered 2020-04-19: 60 mg via INTRAVENOUS

## 2020-04-19 MED ORDER — LEVETIRACETAM IN NACL 500 MG/100ML IV SOLN
500.0000 mg | Freq: Two times a day (BID) | INTRAVENOUS | Status: DC
  Administered 2020-04-19 – 2020-04-20 (×3): 500 mg via INTRAVENOUS
  Filled 2020-04-19 (×3): qty 100

## 2020-04-19 MED ORDER — DOCUSATE SODIUM 100 MG PO CAPS
100.0000 mg | ORAL_CAPSULE | Freq: Two times a day (BID) | ORAL | Status: DC
  Administered 2020-04-20 – 2020-04-21 (×2): 100 mg via ORAL
  Filled 2020-04-19 (×2): qty 1

## 2020-04-19 MED ORDER — HYDRALAZINE HCL 20 MG/ML IJ SOLN
10.0000 mg | INTRAMUSCULAR | Status: DC | PRN
  Filled 2020-04-19: qty 1

## 2020-04-19 MED ORDER — MIDAZOLAM HCL 2 MG/2ML IJ SOLN
INTRAMUSCULAR | Status: AC
  Filled 2020-04-19: qty 2

## 2020-04-19 MED ORDER — HYDROMORPHONE HCL 1 MG/ML IJ SOLN
INTRAMUSCULAR | Status: AC
  Filled 2020-04-19: qty 1

## 2020-04-19 MED ORDER — HYDROMORPHONE HCL 1 MG/ML IJ SOLN
0.2500 mg | INTRAMUSCULAR | Status: DC | PRN
  Administered 2020-04-19: 0.25 mg via INTRAVENOUS

## 2020-04-19 MED ORDER — LORATADINE 10 MG PO TABS
10.0000 mg | ORAL_TABLET | Freq: Every day | ORAL | Status: DC
  Administered 2020-04-21: 10 mg via ORAL
  Filled 2020-04-19 (×2): qty 1

## 2020-04-19 MED ORDER — POTASSIUM CHLORIDE 10 MEQ/100ML IV SOLN
INTRAVENOUS | Status: DC | PRN
Start: 2020-04-19 — End: 2020-04-19
  Administered 2020-04-19: 10 meq via INTRAVENOUS

## 2020-04-19 MED ORDER — 0.9 % SODIUM CHLORIDE (POUR BTL) OPTIME
TOPICAL | Status: DC | PRN
  Administered 2020-04-19 (×3): 1000 mL

## 2020-04-19 MED ORDER — HYDROCODONE-ACETAMINOPHEN 5-325 MG PO TABS
1.0000 | ORAL_TABLET | ORAL | Status: DC | PRN
  Administered 2020-04-19 – 2020-04-20 (×4): 1 via ORAL
  Filled 2020-04-19 (×4): qty 1

## 2020-04-19 MED ORDER — MORPHINE SULFATE (PF) 2 MG/ML IV SOLN
1.0000 mg | INTRAVENOUS | Status: DC | PRN
  Administered 2020-04-19 – 2020-04-20 (×4): 1 mg via INTRAVENOUS
  Filled 2020-04-19 (×4): qty 1

## 2020-04-19 MED ORDER — OXYCODONE HCL ER 10 MG PO T12A
10.0000 mg | EXTENDED_RELEASE_TABLET | Freq: Two times a day (BID) | ORAL | Status: DC
  Administered 2020-04-19 – 2020-04-21 (×4): 10 mg via ORAL
  Filled 2020-04-19 (×4): qty 1

## 2020-04-19 MED ORDER — THROMBIN 20000 UNITS EX KIT
PACK | CUTANEOUS | Status: AC
  Filled 2020-04-19: qty 1

## 2020-04-19 MED ORDER — ONDANSETRON HCL 4 MG/2ML IJ SOLN
INTRAMUSCULAR | Status: AC
  Administered 2020-04-19: 4 mg via INTRAVENOUS
  Filled 2020-04-19: qty 2

## 2020-04-19 MED ORDER — CEFAZOLIN SODIUM-DEXTROSE 2-3 GM-%(50ML) IV SOLR
INTRAVENOUS | Status: DC | PRN
  Administered 2020-04-19: 2 g via INTRAVENOUS

## 2020-04-19 MED ORDER — FENTANYL CITRATE (PF) 100 MCG/2ML IJ SOLN
INTRAMUSCULAR | Status: DC | PRN
  Administered 2020-04-19: 50 ug via INTRAVENOUS
  Administered 2020-04-19: 150 ug via INTRAVENOUS
  Administered 2020-04-19 (×2): 50 ug via INTRAVENOUS

## 2020-04-19 MED ORDER — NALOXONE HCL 0.4 MG/ML IJ SOLN: 0.0800 mg | INTRAMUSCULAR | Status: DC | PRN

## 2020-04-19 MED ORDER — MAGNESIUM CITRATE PO SOLN: 1.0000 | Freq: Once | ORAL | Status: DC | PRN

## 2020-04-19 MED ORDER — MANNITOL 25 % IV SOLN
INTRAVENOUS | Status: DC | PRN
Start: 2020-04-19 — End: 2020-04-19
  Administered 2020-04-19: 37.5 g via INTRAVENOUS

## 2020-04-19 MED ORDER — ORAL CARE MOUTH RINSE: 15.0000 mL | Freq: Once | OROMUCOSAL | Status: AC

## 2020-04-19 MED ORDER — PROPOFOL 10 MG/ML IV BOLUS
INTRAVENOUS | Status: AC
  Filled 2020-04-19: qty 20

## 2020-04-19 MED ORDER — LABETALOL HCL 5 MG/ML IV SOLN: 10.0000 mg | INTRAVENOUS | Status: DC | PRN

## 2020-04-19 MED ORDER — PHENYLEPHRINE 40 MCG/ML (10ML) SYRINGE FOR IV PUSH (FOR BLOOD PRESSURE SUPPORT)
PREFILLED_SYRINGE | INTRAVENOUS | Status: AC
  Filled 2020-04-19: qty 10

## 2020-04-19 MED ORDER — ALIGN 4 MG PO CAPS: 4.0000 mg | ORAL_CAPSULE | Freq: Every day | ORAL | Status: DC

## 2020-04-19 MED ORDER — PANTOPRAZOLE SODIUM 40 MG PO TBEC
40.0000 mg | DELAYED_RELEASE_TABLET | Freq: Every day | ORAL | Status: DC
  Administered 2020-04-21: 40 mg via ORAL
  Filled 2020-04-19 (×2): qty 1

## 2020-04-19 MED ORDER — LIDOCAINE-EPINEPHRINE 0.5 %-1:200000 IJ SOLN
INTRAMUSCULAR | Status: DC | PRN
  Administered 2020-04-19: 10 mL

## 2020-04-19 MED ORDER — OXYCODONE HCL 5 MG/5ML PO SOLN: 5.0000 mg | Freq: Once | ORAL | Status: DC | PRN

## 2020-04-19 MED ORDER — ONDANSETRON HCL 4 MG PO TABS
4.0000 mg | ORAL_TABLET | ORAL | Status: DC | PRN
  Administered 2020-04-21: 4 mg via ORAL
  Filled 2020-04-19: qty 1

## 2020-04-19 MED ORDER — OXYCODONE HCL 5 MG PO TABS: 5.0000 mg | ORAL_TABLET | Freq: Once | ORAL | Status: DC | PRN

## 2020-04-19 MED ORDER — CEFAZOLIN SODIUM-DEXTROSE 2-4 GM/100ML-% IV SOLN
INTRAVENOUS | Status: AC
  Filled 2020-04-19: qty 100

## 2020-04-19 MED ORDER — VITAMIN D 25 MCG (1000 UNIT) PO TABS
1000.0000 [IU] | ORAL_TABLET | Freq: Two times a day (BID) | ORAL | Status: DC
  Administered 2020-04-20 – 2020-04-21 (×2): 1000 [IU] via ORAL
  Filled 2020-04-19 (×3): qty 1

## 2020-04-19 MED ORDER — LIDOCAINE-EPINEPHRINE 0.5 %-1:200000 IJ SOLN
INTRAMUSCULAR | Status: AC
  Filled 2020-04-19: qty 1

## 2020-04-19 MED ORDER — SUGAMMADEX SODIUM 200 MG/2ML IV SOLN
INTRAVENOUS | Status: DC | PRN
  Administered 2020-04-19: 200 mg via INTRAVENOUS

## 2020-04-19 MED ORDER — SODIUM CHLORIDE 0.9 % IV SOLN
INTRAVENOUS | Status: DC | PRN
  Administered 2020-04-19: 1000 mg via INTRAVENOUS

## 2020-04-19 MED ORDER — ONDANSETRON HCL 4 MG/2ML IJ SOLN
4.0000 mg | INTRAMUSCULAR | Status: DC | PRN
  Administered 2020-04-19 – 2020-04-20 (×5): 4 mg via INTRAVENOUS
  Filled 2020-04-19 (×6): qty 2

## 2020-04-19 MED ORDER — DEXAMETHASONE 4 MG PO TABS
4.0000 mg | ORAL_TABLET | Freq: Four times a day (QID) | ORAL | Status: AC
  Administered 2020-04-20 – 2020-04-21 (×4): 4 mg via ORAL
  Filled 2020-04-19 (×4): qty 1

## 2020-04-19 MED ORDER — ACETAMINOPHEN 650 MG RE SUPP: 650.0000 mg | RECTAL | Status: DC | PRN

## 2020-04-19 MED ORDER — FENTANYL CITRATE (PF) 100 MCG/2ML IJ SOLN
INTRAMUSCULAR | Status: AC
  Filled 2020-04-19: qty 2

## 2020-04-19 MED ORDER — THROMBIN 5000 UNITS EX SOLR
OROMUCOSAL | Status: DC | PRN
  Administered 2020-04-19: 5 mL via TOPICAL

## 2020-04-19 MED ORDER — THROMBIN 5000 UNITS EX SOLR
CUTANEOUS | Status: AC
  Filled 2020-04-19: qty 10000

## 2020-04-19 MED ORDER — ONDANSETRON HCL 4 MG/2ML IJ SOLN
INTRAMUSCULAR | Status: DC | PRN
  Administered 2020-04-19: 4 mg via INTRAVENOUS

## 2020-04-19 MED ORDER — FENTANYL CITRATE (PF) 250 MCG/5ML IJ SOLN
INTRAMUSCULAR | Status: AC
  Filled 2020-04-19: qty 5

## 2020-04-19 MED ORDER — BACITRACIN ZINC 500 UNIT/GM EX OINT
TOPICAL_OINTMENT | CUTANEOUS | Status: AC
  Filled 2020-04-19: qty 28.35

## 2020-04-19 MED ORDER — MIDAZOLAM HCL 5 MG/5ML IJ SOLN
INTRAMUSCULAR | Status: DC | PRN
  Administered 2020-04-19: 1 mg via INTRAVENOUS

## 2020-04-19 MED ORDER — SODIUM CHLORIDE 0.9 % IV SOLN: INTRAVENOUS | Status: DC | PRN

## 2020-04-19 MED ORDER — POTASSIUM CHLORIDE IN NACL 20-0.9 MEQ/L-% IV SOLN
INTRAVENOUS | Status: DC
  Filled 2020-04-19 (×3): qty 1000

## 2020-04-19 MED ORDER — DEXAMETHASONE 6 MG PO TABS
6.0000 mg | ORAL_TABLET | Freq: Four times a day (QID) | ORAL | Status: AC
  Administered 2020-04-19 – 2020-04-20 (×4): 6 mg via ORAL
  Filled 2020-04-19 (×5): qty 1

## 2020-04-19 MED ORDER — DEXAMETHASONE 4 MG PO TABS: 4.0000 mg | ORAL_TABLET | Freq: Three times a day (TID) | ORAL | Status: DC

## 2020-04-19 MED ORDER — ACETAMINOPHEN 325 MG PO TABS: 650.0000 mg | ORAL_TABLET | ORAL | Status: DC | PRN

## 2020-04-19 MED ORDER — HEMOSTATIC AGENTS (NO CHARGE) OPTIME
TOPICAL | Status: DC | PRN
  Administered 2020-04-19: 1 via TOPICAL

## 2020-04-19 MED ORDER — BISACODYL 5 MG PO TBEC: 5.0000 mg | DELAYED_RELEASE_TABLET | Freq: Every day | ORAL | Status: DC | PRN

## 2020-04-19 MED ORDER — BACITRACIN ZINC 500 UNIT/GM EX OINT
TOPICAL_OINTMENT | CUTANEOUS | Status: DC | PRN
  Administered 2020-04-19 (×2): 1 via TOPICAL

## 2020-04-19 MED ORDER — SODIUM CHLORIDE 0.9 % IV SOLN: INTRAVENOUS | Status: DC

## 2020-04-19 MED ORDER — MORPHINE SULFATE (PF) 2 MG/ML IV SOLN
INTRAVENOUS | Status: AC
  Administered 2020-04-19: 1 mg via INTRAVENOUS
  Filled 2020-04-19: qty 1

## 2020-04-19 MED ORDER — FENTANYL CITRATE (PF) 100 MCG/2ML IJ SOLN: 25.0000 ug | INTRAMUSCULAR | Status: DC | PRN

## 2020-04-19 MED ORDER — CHLORHEXIDINE GLUCONATE CLOTH 2 % EX PADS
6.0000 | MEDICATED_PAD | Freq: Every day | CUTANEOUS | Status: DC
  Administered 2020-04-19: 6 via TOPICAL

## 2020-04-19 MED ORDER — MELATONIN 3 MG PO TABS
3.0000 mg | ORAL_TABLET | Freq: Every evening | ORAL | Status: DC | PRN
  Administered 2020-04-19: 3 mg via ORAL
  Filled 2020-04-19 (×2): qty 1

## 2020-04-19 MED ORDER — ONDANSETRON HCL 4 MG/2ML IJ SOLN: 4.0000 mg | Freq: Four times a day (QID) | INTRAMUSCULAR | Status: DC | PRN

## 2020-04-19 MED ORDER — THROMBIN 20000 UNITS EX SOLR: CUTANEOUS | Status: DC | PRN

## 2020-04-19 MED ORDER — LIDOCAINE 2% (20 MG/ML) 5 ML SYRINGE
INTRAMUSCULAR | Status: DC | PRN
  Administered 2020-04-19: 40 mg via INTRAVENOUS

## 2020-04-19 MED ORDER — SENNOSIDES-DOCUSATE SODIUM 8.6-50 MG PO TABS: 1.0000 | ORAL_TABLET | Freq: Every evening | ORAL | Status: DC | PRN

## 2020-04-19 MED ORDER — OXYCODONE HCL 5 MG PO TABS
5.0000 mg | ORAL_TABLET | ORAL | Status: DC | PRN
  Administered 2020-04-20 – 2020-04-21 (×3): 10 mg via ORAL
  Administered 2020-04-21: 5 mg via ORAL
  Filled 2020-04-19: qty 1
  Filled 2020-04-19: qty 2
  Filled 2020-04-19 (×2): qty 1
  Filled 2020-04-19: qty 2

## 2020-04-19 MED ORDER — DEXAMETHASONE SODIUM PHOSPHATE 10 MG/ML IJ SOLN
INTRAMUSCULAR | Status: DC | PRN
  Administered 2020-04-19: 10 mg via INTRAVENOUS

## 2020-04-19 MED ORDER — PHENYLEPHRINE HCL-NACL 10-0.9 MG/250ML-% IV SOLN
INTRAVENOUS | Status: DC | PRN
Start: 2020-04-19 — End: 2020-04-19
  Administered 2020-04-19: 10 ug/min via INTRAVENOUS

## 2020-04-19 MED ORDER — RISAQUAD PO CAPS
1.0000 | ORAL_CAPSULE | Freq: Every day | ORAL | Status: DC
  Administered 2020-04-21: 1 via ORAL
  Filled 2020-04-19 (×3): qty 1

## 2020-04-19 MED ORDER — CHLORHEXIDINE GLUCONATE 0.12 % MT SOLN
15.0000 mL | Freq: Once | OROMUCOSAL | Status: AC
  Administered 2020-04-19: 15 mL via OROMUCOSAL
  Filled 2020-04-19: qty 15

## 2020-04-19 MED ORDER — SODIUM CHLORIDE 0.9 % IR SOLN
Status: DC | PRN
  Administered 2020-04-19: 1000 mL

## 2020-04-19 MED ORDER — HEPARIN SODIUM (PORCINE) 5000 UNIT/ML IJ SOLN
5000.0000 [IU] | Freq: Three times a day (TID) | INTRAMUSCULAR | Status: DC
  Administered 2020-04-19 – 2020-04-21 (×4): 5000 [IU] via SUBCUTANEOUS
  Filled 2020-04-19 (×4): qty 1

## 2020-04-19 SURGICAL SUPPLY — 96 items
BAND RUBBER #18 3X1/16 STRL (MISCELLANEOUS) ×6 IMPLANT
BENZOIN TINCTURE PRP APPL 2/3 (GAUZE/BANDAGES/DRESSINGS) IMPLANT
BLADE CLIPPER SURG (BLADE) ×3 IMPLANT
BLADE SAW GIGLI 16 STRL (MISCELLANEOUS) IMPLANT
BLADE SURG 11 STRL SS (BLADE) ×3 IMPLANT
BLADE SURG 15 STRL LF DISP TIS (BLADE) IMPLANT
BLADE SURG 15 STRL SS (BLADE)
BLADE ULTRA TIP 2M (BLADE) IMPLANT
BNDG GAUZE ELAST 4 BULKY (GAUZE/BANDAGES/DRESSINGS) IMPLANT
BUR ACORN 6.0 PRECISION (BURR) ×3 IMPLANT
BUR MATCHSTICK NEURO 3.0 LAGG (BURR) IMPLANT
BUR SPIRAL ROUTER 2.3 (BUR) ×3 IMPLANT
CANISTER SUCT 3000ML PPV (MISCELLANEOUS) ×6 IMPLANT
CARTRIDGE OIL MAESTRO DRILL (MISCELLANEOUS) ×2 IMPLANT
CATH VENTRIC 35X38 W/TROCAR LG (CATHETERS) IMPLANT
CLIP VESOCCLUDE MED 6/CT (CLIP) IMPLANT
CNTNR URN SCR LID CUP LEK RST (MISCELLANEOUS) ×2 IMPLANT
CONT SPEC 4OZ STRL OR WHT (MISCELLANEOUS) ×3
COVER BURR HOLE UNIV 10 (Orthopedic Implant) ×6 IMPLANT
COVER WAND RF STERILE (DRAPES) IMPLANT
DECANTER SPIKE VIAL GLASS SM (MISCELLANEOUS) ×3 IMPLANT
DIFFUSER DRILL AIR PNEUMATIC (MISCELLANEOUS) ×3 IMPLANT
DRAIN SUBARACHNOID (WOUND CARE) IMPLANT
DRAPE CAMERA VIDEO/LASER (DRAPES) IMPLANT
DRAPE MICROSCOPE LEICA (MISCELLANEOUS) ×3 IMPLANT
DRAPE NEUROLOGICAL W/INCISE (DRAPES) ×3 IMPLANT
DRAPE ORTHO SPLIT 77X108 STRL (DRAPES)
DRAPE SURG 17X23 STRL (DRAPES) IMPLANT
DRAPE SURG ORHT 6 SPLT 77X108 (DRAPES) IMPLANT
DRAPE WARM FLUID 44X44 (DRAPES) ×3 IMPLANT
DRSG OPSITE 4X5.5 SM (GAUZE/BANDAGES/DRESSINGS) ×6 IMPLANT
DRSG TELFA 3X8 NADH (GAUZE/BANDAGES/DRESSINGS) ×3 IMPLANT
DURAPREP 26ML APPLICATOR (WOUND CARE) ×3 IMPLANT
DURAPREP 6ML APPLICATOR 50/CS (WOUND CARE) IMPLANT
ELECT REM PT RETURN 9FT ADLT (ELECTROSURGICAL) ×3
ELECTRODE REM PT RTRN 9FT ADLT (ELECTROSURGICAL) ×2 IMPLANT
EVACUATOR 1/8 PVC DRAIN (DRAIN) IMPLANT
EVACUATOR SILICONE 100CC (DRAIN) IMPLANT
FORCEPS BIPO MALIS IRRIG 9X1.5 (NEUROSURGERY SUPPLIES) ×3 IMPLANT
GAUZE 4X4 16PLY RFD (DISPOSABLE) IMPLANT
GAUZE SPONGE 4X4 12PLY STRL (GAUZE/BANDAGES/DRESSINGS) IMPLANT
GLOVE BIOGEL PI IND STRL 6.5 (GLOVE) ×2 IMPLANT
GLOVE BIOGEL PI IND STRL 7.5 (GLOVE) ×4 IMPLANT
GLOVE BIOGEL PI IND STRL 8 (GLOVE) ×8 IMPLANT
GLOVE BIOGEL PI INDICATOR 6.5 (GLOVE) ×1
GLOVE BIOGEL PI INDICATOR 7.5 (GLOVE) ×2
GLOVE BIOGEL PI INDICATOR 8 (GLOVE) ×4
GLOVE ECLIPSE 6.5 STRL STRAW (GLOVE) ×6 IMPLANT
GLOVE ECLIPSE 7.5 STRL STRAW (GLOVE) ×9 IMPLANT
GLOVE EXAM NITRILE XL STR (GLOVE) IMPLANT
GLOVE SURG SS PI 6.0 STRL IVOR (GLOVE) ×6 IMPLANT
GOWN STRL REUS W/ TWL LRG LVL3 (GOWN DISPOSABLE) ×4 IMPLANT
GOWN STRL REUS W/ TWL XL LVL3 (GOWN DISPOSABLE) ×2 IMPLANT
GOWN STRL REUS W/TWL 2XL LVL3 (GOWN DISPOSABLE) ×6 IMPLANT
GOWN STRL REUS W/TWL LRG LVL3 (GOWN DISPOSABLE) ×6
GOWN STRL REUS W/TWL XL LVL3 (GOWN DISPOSABLE) ×3
HEMOSTAT POWDER KIT SURGIFOAM (HEMOSTASIS) ×6 IMPLANT
HEMOSTAT SURGICEL 2X14 (HEMOSTASIS) ×3 IMPLANT
HOOK DURA 1/2IN (MISCELLANEOUS) ×3 IMPLANT
IV NS 1000ML (IV SOLUTION) ×3
IV NS 1000ML BAXH (IV SOLUTION) ×2 IMPLANT
KIT BASIN OR (CUSTOM PROCEDURE TRAY) ×3 IMPLANT
KIT DRAIN CSF ACCUDRAIN (MISCELLANEOUS) IMPLANT
KIT TURNOVER KIT B (KITS) ×3 IMPLANT
MARKER SPHERE PSV REFLC 13MM (MARKER) ×6 IMPLANT
NEEDLE HYPO 25X1 1.5 SAFETY (NEEDLE) ×3 IMPLANT
NEEDLE SPNL 18GX3.5 QUINCKE PK (NEEDLE) IMPLANT
NS IRRIG 1000ML POUR BTL (IV SOLUTION) ×9 IMPLANT
OIL CARTRIDGE MAESTRO DRILL (MISCELLANEOUS) ×3
PACK BATTERY CMF DISP FOR DVR (ORTHOPEDIC DISPOSABLE SUPPLIES) ×3 IMPLANT
PACK CRANIOTOMY CUSTOM (CUSTOM PROCEDURE TRAY) ×3 IMPLANT
PATTIES SURGICAL .25X.25 (GAUZE/BANDAGES/DRESSINGS) IMPLANT
PATTIES SURGICAL .5 X.5 (GAUZE/BANDAGES/DRESSINGS) IMPLANT
PATTIES SURGICAL .5 X3 (DISPOSABLE) ×3 IMPLANT
PATTIES SURGICAL 1/4 X 3 (GAUZE/BANDAGES/DRESSINGS) IMPLANT
PATTIES SURGICAL 1X1 (DISPOSABLE) IMPLANT
PLATE CRANIAL 12 2H RIGID UNI (Plate) ×6 IMPLANT
SCREW UNIII AXS SD 1.5X4 (Screw) ×36 IMPLANT
SET TUBING IRRIGATION DISP (TUBING) ×3 IMPLANT
SPECIMEN JAR SMALL (MISCELLANEOUS) IMPLANT
SPONGE NEURO XRAY DETECT 1X3 (DISPOSABLE) IMPLANT
SPONGE SURGIFOAM ABS GEL 100 (HEMOSTASIS) ×3 IMPLANT
STAPLER VISISTAT 35W (STAPLE) ×3 IMPLANT
SUT ETHILON 3 0 FSL (SUTURE) IMPLANT
SUT ETHILON 3 0 PS 1 (SUTURE) IMPLANT
SUT NURALON 4 0 TR CR/8 (SUTURE) ×9 IMPLANT
SUT SILK 0 TIES 10X30 (SUTURE) IMPLANT
SUT STEEL 0 (SUTURE)
SUT STEEL 0 18XMFL TIE 17 (SUTURE) IMPLANT
SUT VIC AB 2-0 CT2 18 VCP726D (SUTURE) ×6 IMPLANT
TOWEL GREEN STERILE (TOWEL DISPOSABLE) ×3 IMPLANT
TOWEL GREEN STERILE FF (TOWEL DISPOSABLE) ×3 IMPLANT
TRAY FOLEY MTR SLVR 16FR STAT (SET/KITS/TRAYS/PACK) ×3 IMPLANT
TUBE CONNECTING 12X1/4 (SUCTIONS) IMPLANT
UNDERPAD 30X36 HEAVY ABSORB (UNDERPADS AND DIAPERS) IMPLANT
WATER STERILE IRR 1000ML POUR (IV SOLUTION) ×3 IMPLANT

## 2020-04-19 NOTE — Anesthesia Postprocedure Evaluation (Signed)
Anesthesia Post Note  Patient: Terry Bell  Procedure(s) Performed: Right Temporal craniotomy for tumor resection (Right Head) APPLICATION OF CRANIAL NAVIGATION (N/A )     Patient location during evaluation: PACU Anesthesia Type: General Level of consciousness: awake and alert Pain management: pain level controlled Vital Signs Assessment: post-procedure vital signs reviewed and stable Respiratory status: spontaneous breathing, nonlabored ventilation, respiratory function stable and patient connected to nasal cannula oxygen Cardiovascular status: blood pressure returned to baseline and stable Postop Assessment: no apparent nausea or vomiting Anesthetic complications: no   No complications documented.  Last Vitals:  Vitals:   04/19/20 1545 04/19/20 1600  BP:    Pulse: (!) 51   Resp: 17   Temp:  37.1 C  SpO2: 97%     Last Pain:  Vitals:   04/19/20 1600  TempSrc: Oral  PainSc:                  [place]

## 2020-04-19 NOTE — Op Note (Signed)
04/19/2020  6:12 PM  PATIENT:  Terry Bell  64 y.o. female who presented with a headache and no other symptoms. An MRI scan revealed along with a CT, a rim enhancing lesion which is complex in the right temporal lobe. On exam a field cut was noted also. She is admitted for resection of the mass, both for treatment and diagnosis  PRE-OPERATIVE DIAGNOSIS:  Brain tumor, right temporal/parietal  POST-OPERATIVE DIAGNOSIS:  Brain tumor, right temporal/parietal  PROCEDURE:  Procedure(s): Right Temporal craniotomy for tumor resection APPLICATION OF CRANIAL NAVIGATION  SURGEON: Surgeon(s): Ashok Pall, MD Vallarie Mare, MD  ASSISTANTS:thomas, jonathan  ANESTHESIA:   general  EBL:  Total I/O In: 1995.3 [I.V.:1695.3; IV Piggyback:300] Out: 9201 [EOFHQ:1975; Blood:250]  BLOOD ADMINISTERED:none  CELL SAVER GIVEN:none  COUNT:per nursing  DRAINS: none   SPECIMEN:  Source of Specimen:  right temporal lobe  DICTATION: Terry Bell was taken to the operating room, intubated, and placed under a general anesthetic without difficulty. She was positioned supine with her head turned towards the left, with her head in a three pin [place] head holder.We performed the localization of the head using the brain lab system allowing for stereotactic navigation intracranially. The preoperative plan was scanned into the system prior to the case start.  Her head was shaved was prepped and was draped in a sterile manner. We made a curvilinear incision in the coronal plane, using the navigation to plan the incision. Raney clips were placed on the scalp edges, and cerebellar retractors were placed to expose the skull. A rough outline of the tumor was drawn on the skull to ensure adequate exposure could be accomplished.  Two burr holes were created and the flap turned with the craniotome. The dura was opened and the flap reflected laterally. The tumor was apparent on the surface. The tumor was then  resected. The bipolar was used to score the surface of the brain, a corticotomy was performed and the tumor and brain interface plane was developed with suction and cautery. We worked around the margins of the mass, until the ventricle was entered. We protected the ventricle and continued to remove tumor. Bleeding feeding vessels were identified, and cauterized. The mass was delivered from the brain and sent to the pathology lab.  We continued to work around the margins of the resection bed, and using the navigation removed more tissue that felt like tumor.  We achieved hemostasis, place surgicel on the brain surface then closed. The dura was approximated with sutures. The skull flap approximated with plates and screws. And the scalp was approximated with galeal sutures and staples. A sterile dressing was applied, and she was extubated.   PLAN OF CARE: Admit to inpatient   PATIENT DISPOSITION:  PACU - hemodynamically stable.   Delay start of Pharmacological VTE agent (>24hrs) due to surgical blood loss or risk of bleeding:  no

## 2020-04-19 NOTE — Anesthesia Procedure Notes (Signed)
Procedure Name: Intubation Date/Time: 04/19/2020 10:20 AM Performed by: Lavell Luster, CRNA Pre-anesthesia Checklist: Patient identified, Emergency Drugs available, Suction available, Patient being monitored and Timeout performed Patient Re-evaluated:Patient Re-evaluated prior to induction Oxygen Delivery Method: Circle system utilized Preoxygenation: Pre-oxygenation with 100% oxygen Induction Type: IV induction Ventilation: Mask ventilation without difficulty Laryngoscope Size: Mac and 3 Grade View: Grade I Tube size: 7.0 mm Number of attempts: 1 Airway Equipment and Method: Stylet Placement Confirmation: ETT inserted through vocal cords under direct vision,  positive ETCO2 and breath sounds checked- equal and bilateral Secured at: 22 cm Tube secured with: Tape Dental Injury: Teeth and Oropharynx as per pre-operative assessment

## 2020-04-19 NOTE — Interval H&P Note (Signed)
History and Physical Interval Note:  04/19/2020 9:38 AM  Terry Bell  has presented today for surgery, with the diagnosis of Brain tumor.  The various methods of treatment have been discussed with the patient and family. After consideration of risks, benefits and other options for treatment, the patient has consented to  Procedure(s) with comments: Right Temporal craniotomy for tumor resection (Right) - [facility] APPLICATION OF CRANIAL NAVIGATION (N/A) as a surgical intervention.  The patient's history has been reviewed, patient examined, no change in status, stable for surgery.  I have reviewed the patient's chart and labs.  Questions were answered to the patient's satisfaction.    She has a dense left hemianopsia. This is consistent with the tumor location.  Ashok Pall

## 2020-04-19 NOTE — Anesthesia Procedure Notes (Signed)
Arterial Line Insertion Start/End6/24/2021 10:00 AM, 04/19/2020 10:15 AM Performed by: Lavell Luster, CRNA, CRNA  Preanesthetic checklist: patient identified, IV checked, risks and benefits discussed, surgical consent, monitors and equipment checked, pre-op evaluation and timeout performed Lidocaine 1% used for infiltration and patient sedated Right, radial was placed Catheter size: 20 G Hand hygiene performed , maximum sterile barriers used  and Seldinger technique used Allen's test indicative of satisfactory collateral circulation Attempts: 3 Procedure performed without using ultrasound guided technique. Following insertion, Biopatch and dressing applied. Post procedure assessment: normal  Additional procedure comments: Attempted x 2 on left, artery accessed easily with good flow but wire would not thread.  Dr Marcie Bal aware, moved to right side with success, pt tolerated well.  Henderson Cloud, CRNA.

## 2020-04-19 NOTE — Transfer of Care (Signed)
Immediate Anesthesia Transfer of Care Note  Patient: Terry Bell  Procedure(s) Performed: Right Temporal craniotomy for tumor resection (Right Head) APPLICATION OF CRANIAL NAVIGATION (N/A )  Patient Location: PACU  Anesthesia Type:General  Level of Consciousness: awake, alert , sedated and patient cooperative  Airway & Oxygen Therapy: Patient connected to face mask oxygen  Post-op Assessment: Post -op Vital signs reviewed and stable  Post vital signs: stable  Last Vitals:  Vitals Value Taken Time  BP 139/92 04/19/20 1355  Temp 36.5 C 04/19/20 1355  Pulse 89 04/19/20 1358  Resp 18 04/19/20 1358  SpO2 96 % 04/19/20 1358  Vitals shown include unvalidated device data.  Last Pain:  Vitals:   04/19/20 0846  TempSrc:   PainSc: 0-No pain         Complications: No complications documented.

## 2020-04-19 NOTE — Anesthesia Preprocedure Evaluation (Signed)
Anesthesia Evaluation  Patient identified by MRN, date of birth, ID band Patient awake    Reviewed: Allergy & Precautions, H&P , NPO status , Patient's Chart, lab work & pertinent test results  History of Anesthesia Complications (+) PONV and history of anesthetic complications  Airway Mallampati: II   Neck ROM: full    Dental   Pulmonary sleep apnea ,    breath sounds clear to auscultation       Cardiovascular negative cardio ROS   Rhythm:regular Rate:Normal     Neuro/Psych  Headaches, PSYCHIATRIC DISORDERS Depression Brain mass    GI/Hepatic   Endo/Other    Renal/GU      Musculoskeletal  (+) Arthritis ,   Abdominal   Peds  Hematology   Anesthesia Other Findings   Reproductive/Obstetrics                             Anesthesia Physical Anesthesia Plan  ASA: II  Anesthesia Plan: General   Post-op Pain Management:    Induction: Intravenous  PONV Risk Score and Plan: 4 or greater and Ondansetron, Dexamethasone, Midazolam, Treatment may vary due to age or medical condition and Propofol infusion  Airway Management Planned: Oral ETT  Additional Equipment: Arterial line  Intra-op Plan:   Post-operative Plan: Extubation in OR  Informed Consent: I have reviewed the patients History and Physical, chart, labs and discussed the procedure including the risks, benefits and alternatives for the proposed anesthesia with the patient or authorized representative who has indicated his/her understanding and acceptance.       Plan Discussed with: CRNA, Anesthesiologist and Surgeon  Anesthesia Plan Comments:         Anesthesia Quick Evaluation

## 2020-04-19 NOTE — Progress Notes (Signed)
Pt SBP>160's but HR 50's. Paged Dr. Cabbell as ordered parameters are not met due to low HR. Awaiting call back.  

## 2020-04-20 ENCOUNTER — Other Ambulatory Visit: Payer: Self-pay | Admitting: *Deleted

## 2020-04-20 ENCOUNTER — Inpatient Hospital Stay (HOSPITAL_COMMUNITY): Payer: 59

## 2020-04-20 ENCOUNTER — Encounter (HOSPITAL_COMMUNITY): Payer: Self-pay | Admitting: Neurosurgery

## 2020-04-20 DIAGNOSIS — G4730 Sleep apnea, unspecified: Secondary | ICD-10-CM | POA: Diagnosis not present

## 2020-04-20 DIAGNOSIS — C713 Malignant neoplasm of parietal lobe: Secondary | ICD-10-CM | POA: Diagnosis not present

## 2020-04-20 DIAGNOSIS — Z20822 Contact with and (suspected) exposure to covid-19: Secondary | ICD-10-CM | POA: Diagnosis not present

## 2020-04-20 DIAGNOSIS — Z79899 Other long term (current) drug therapy: Secondary | ICD-10-CM | POA: Diagnosis not present

## 2020-04-20 DIAGNOSIS — C712 Malignant neoplasm of temporal lobe: Secondary | ICD-10-CM | POA: Diagnosis not present

## 2020-04-20 DIAGNOSIS — Z7952 Long term (current) use of systemic steroids: Secondary | ICD-10-CM | POA: Diagnosis not present

## 2020-04-20 DIAGNOSIS — Z808 Family history of malignant neoplasm of other organs or systems: Secondary | ICD-10-CM | POA: Diagnosis not present

## 2020-04-20 DIAGNOSIS — C711 Malignant neoplasm of frontal lobe: Secondary | ICD-10-CM

## 2020-04-20 DIAGNOSIS — Z833 Family history of diabetes mellitus: Secondary | ICD-10-CM | POA: Diagnosis not present

## 2020-04-20 DIAGNOSIS — H5347 Heteronymous bilateral field defects: Secondary | ICD-10-CM | POA: Diagnosis not present

## 2020-04-20 MED ORDER — DEXAMETHASONE 4 MG PO TABS: 4.0000 mg | ORAL_TABLET | Freq: Two times a day (BID) | ORAL | 2 refills | Status: DC

## 2020-04-20 MED ORDER — GADOBUTROL 1 MMOL/ML IV SOLN
7.5000 mL | Freq: Once | INTRAVENOUS | Status: AC | PRN
  Administered 2020-04-20: 7.5 mL via INTRAVENOUS

## 2020-04-20 MED ORDER — LEVETIRACETAM 500 MG PO TABS
500.0000 mg | ORAL_TABLET | Freq: Two times a day (BID) | ORAL | 1 refills | Status: DC
Start: 2020-04-20 — End: 2020-04-26

## 2020-04-20 MED ORDER — OXYCODONE HCL 5 MG PO TABS: 5.0000 mg | ORAL_TABLET | Freq: Four times a day (QID) | ORAL | 0 refills | Status: AC | PRN

## 2020-04-20 MED FILL — Thrombin For Soln 5000 Unit: CUTANEOUS | Qty: 5000 | Status: AC

## 2020-04-20 MED FILL — Thrombin For Soln Kit 20000 Unit: CUTANEOUS | Qty: 1 | Status: AC

## 2020-04-20 NOTE — Consult Note (Signed)
[facility] Consult Note  Patient Care Team: Martinique, Betty G, MD as PCP - General (Family Medicine) Schmeltzer, Dorene Ar, MD as Referring Physician (Gastroenterology) Rolm Bookbinder, MD as Consulting Physician (Dermatology) Alfonzo Feller, RN as [place] Management  CHIEF COMPLAINTS/PURPOSE OF CONSULTATION:  Brain Tumor  HISTORY OF PRESENTING ILLNESS:  Terry Bell 64 y.o. female presented last week with several days of progressive new-onset frontal headache.  This may be have been associated with change in taste, but no other neurologic complaints.  CNS imaging demonstrated a large right temporal mass.  She underwent resection on 04/19/20 with Dr. Christella Noa, currently day #1 post-op.  She has no complaints today aside from some pain at the surgical site.  She is moving left arm and leg well but has not been out of bed yet.  MEDICAL HISTORY:  Past Medical History:  Diagnosis Date  . Arthritis   . Chronic nonalcoholic liver disease   . Complication of anesthesia   . Continuous leakage of urine   . Depression    situational  . Diverticulosis   . OSA (obstructive sleep apnea) 07/22/2016  . PONV (postoperative nausea and vomiting)   . Steatohepatitis   . Tinnitus   . Varicose veins     SURGICAL HISTORY: Past Surgical History:  Procedure Laterality Date  . APPLICATION OF CRANIAL NAVIGATION N/A 04/19/2020   Procedure: APPLICATION OF CRANIAL NAVIGATION;  Surgeon: Ashok Pall, MD;  Location: [facility];  Service: Neurosurgery;  Laterality: N/A;  . arthroscopic left knee sugery    . bilateral bunionectomy    . BREAST BIOPSY Left   . CHOLECYSTECTOMY    . colonsocopy    . CRANIOTOMY Right 04/19/2020   Procedure: Right Temporal craniotomy for tumor resection;  Surgeon: Ashok Pall, MD;  Location: [facility];  Service: Neurosurgery;  Laterality: Right;  . debridement and removal and lateral release of torn cartlidge in rt knee    . gum grating     . rt knee arthroscopy    . torn cartledge in rt knee      SOCIAL HISTORY: Social History   Socioeconomic History  . Marital status: Married    Spouse name: Not on file  . Number of children: Not on file  . Years of education: Not on file  . Highest education level: Not on file  Occupational History  . 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 Smoking status: Never Smoker  . Smokeless tobacco: Never Used  Substance and Sexual Activity  . Alcohol use: No  . Drug use: No  . Sexual activity: Not on file  Other Topics Concern  . 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 Difficulty of Paying Living Expenses:   Food Insecurity:   . Worried About Charity fundraiser in the Last Year:   . Arboriculturist in the Last Year:   Transportation Needs:   . Film/video editor (Medical):   Marland Kitchen Lack of Transportation (Non-Medical):   Physical Activity:   . Days of Exercise per Week:   . Minutes of Exercise per Session:   Stress:   . Feeling of Stress :   Social Connections:   . Frequency of Communication with Friends and Family:   . Frequency of Social Gatherings  with Friends and Family:   . Attends Religious Services:   . Active Member of Clubs or Organizations:   . Attends Archivist Meetings:   Marland Kitchen Marital Status:   Intimate Partner Violence:   . Fear of Current or Ex-Partner:   . Emotionally Abused:   Marland Kitchen Physically Abused:   . Sexually Abused:     FAMILY HISTORY: Family History  Problem Relation Age of Onset  . Diabetes Mother   . Liver disease Mother   . Diabetes Sister   . Liver disease Sister   . Hypertension Other   . Cancer Other   . Hemochromatosis Other   . Cancer Father        Throat  . Hypertension Brother   . Colon cancer Neg Hx   . Breast cancer Neg Hx     ALLERGIES:  is allergic to  niacin.  MEDICATIONS:  Current Facility-Administered Medications  Medication Dose Route Frequency Provider Last Rate Last Admin  . 0.9 % NaCl with KCl 20 mEq/ L  infusion   Intravenous Continuous Ashok Pall, MD 80 mL/hr at 04/20/20 1400 Rate Verify at 04/20/20 1400  . acetaminophen (TYLENOL) tablet 650 mg  650 mg Oral Q4H PRN Ashok Pall, MD       Or  . acetaminophen (TYLENOL) suppository 650 mg  650 mg Rectal Q4H PRN Ashok Pall, MD      . acidophilus (RISAQUAD) capsule 1 capsule  1 capsule Oral Daily Blenda Nicely, [facility]      . bisacodyl (DULCOLAX) EC tablet 5 mg  5 mg Oral Daily PRN Ashok Pall, MD      . Chlorhexidine Gluconate Cloth 2 % PADS 6 each  6 each Topical Daily Ashok Pall, MD   6 each at 04/19/20 1516  . cholecalciferol (VITAMIN D3) tablet 1,000 Units  1,000 Units Oral BID Ashok Pall, MD      . dexamethasone (DECADRON) tablet 4 mg  4 mg Oral Q6H Ashok Pall, MD       Followed by  . [START ON 04/21/2020] dexamethasone (DECADRON) tablet 4 mg  4 mg Oral Q8H Cabbell, Marylyn Ishihara, MD      . docusate sodium (COLACE) capsule 100 mg  100 mg Oral BID Ashok Pall, MD      . heparin injection 5,000 Units  5,000 Units Subcutaneous Q8H Ashok Pall, MD   5,000 Units at 04/20/20 1516  . hydrALAZINE (APRESOLINE) injection 10 mg  10 mg Intravenous Q4H PRN Ashok Pall, MD      . HYDROcodone-acetaminophen (NORCO/VICODIN) 5-325 MG per tablet 1 tablet  1 tablet Oral Q4H PRN Ashok Pall, MD   1 tablet at 04/20/20 1517  . labetalol (NORMODYNE) injection 10-40 mg  10-40 mg Intravenous Q10 min PRN Ashok Pall, MD      . levETIRAcetam (KEPPRA) IVPB 500 mg/100 mL premix  500 mg Intravenous Q12H Ashok Pall, MD   Stopped at 04/20/20 1015  . loratadine (CLARITIN) tablet 10 mg  10 mg Oral Daily Ashok Pall, MD      . magnesium citrate solution 1 Bottle  1 Bottle Oral Once PRN Ashok Pall, MD      . melatonin tablet 3 mg  3 mg Oral QHS PRN Ashok Pall, MD   3 mg at 04/19/20 2215  .  morphine 2 MG/ML injection 1-2 mg  1-2 mg Intravenous Q2H PRN Ashok Pall, MD   1 mg at 04/20/20 0804  . naloxone (NARCAN) injection 0.08 mg  0.08 mg Intravenous PRN  Ashok Pall, MD      . ondansetron [facility]) tablet 4 mg  4 mg Oral Q4H PRN Ashok Pall, MD       Or  . ondansetron (ZOFRAN) injection 4 mg  4 mg Intravenous Q4H PRN Ashok Pall, MD   4 mg at 04/20/20 1205  . oxyCODONE (Oxy IR/ROXICODONE) immediate release tablet 5-10 mg  5-10 mg Oral Q4H PRN Ashok Pall, MD   10 mg at 04/20/20 1205  . oxyCODONE (OXYCONTIN) 12 hr tablet 10 mg  10 mg Oral Q12H Ashok Pall, MD   10 mg at 04/20/20 0957  . pantoprazole (PROTONIX) EC tablet 40 mg  40 mg Oral Daily Ashok Pall, MD      . promethazine (PHENERGAN) tablet 12.5-25 mg  12.5-25 mg Oral Q4H PRN Ashok Pall, MD      . senna-docusate (Senokot-S) tablet 1 tablet  1 tablet Oral QHS PRN Ashok Pall, MD        REVIEW OF SYSTEMS:   Constitutional: Denies fevers, chills or abnormal weight loss Eyes: Denies blurriness of vision Ears, nose, mouth, throat, and face: Denies mucositis or sore throat Respiratory: Denies cough, dyspnea or wheezes Cardiovascular: Denies palpitation, chest discomfort or lower extremity swelling Gastrointestinal:  Denies nausea, constipation, diarrhea GU: Denies dysuria or incontinence Skin: +pain Neurological: Per HPI Musculoskeletal: Denies joint pain, back or neck discomfort. No decrease in ROM Behavioral/Psych: Denies anxiety, disturbance in thought content, and mood instability   PHYSICAL EXAMINATION: Vitals:   04/20/20 1300 04/20/20 1400  BP: 109/68 132/79  Pulse: (!) 55 (!) 57  Resp: 14 13  Temp:    SpO2: 96% 97%   KPS: 70. General: Alert, cooperative, pleasant, in no acute distress Head: Craniotomy scar noted, dry and intact. EENT: No conjunctival injection or scleral icterus. Oral mucosa moist Lungs: Resp effort normal Cardiac: Regular rate and rhythm Abdomen: Soft, non-distended  abdomen Skin: No rashes cyanosis or petechiae. Extremities: No clubbing or edema  NEUROLOGIC EXAM: Mental Status: Awake, alert, attentive to examiner. Oriented to self and environment. Language is fluent with intact comprehension.  Cranial Nerves: Visual acuity is grossly normal. Visual fields are full. Extra-ocular movements intact. No ptosis. Face is symmetric, tongue midline. Motor: Tone and bulk are normal. Power is full in both arms and legs. Reflexes are symmetric, no pathologic reflexes present. Intact finger to nose bilaterally Sensory: Intact to light touch and temperature Gait: Deferred   LABORATORY DATA:  I have reviewed the data as listed Lab Results  Component Value Date   WBC 10.2 04/19/2020   HGB 14.8 04/19/2020   HCT 42.0 04/19/2020   MCV 89.0 04/19/2020   PLT 159 04/19/2020   Recent Labs    04/13/20 2148 04/13/20 2148 04/14/20 0444 04/17/20 1049 04/19/20 1206 04/19/20 1220 04/19/20 1225 04/19/20 1307 04/19/20 1559  NA 137   < > 138  --    < > 147* 146* 143  --   K 4.0   < > 4.2  --    < > 2.3* 2.3* 3.4*  --   CL 106  --  107  --   --   --  116*  --   --   CO2 22  --  21*  --   --   --   --   --   --   GLUCOSE 107*  --  145*  --   --   --  79  --   --   BUN 18  --  14  --   --   --  13  --   --   CREATININE 0.84   < > 0.75  --   --   --  <0.20*  --  0.67  CALCIUM 9.3  --  9.4  --   --   --   --   --   --   GFRNONAA >60  --  >60  --   --   --   --   --  >60  GFRAA >60  --  >60  --   --   --   --   --  >60  PROT  --   --   --  7.0  --   --   --   --   --   ALBUMIN  --   --   --  4.2  --   --   --   --   --   AST  --   --   --  26  --   --   --   --   --   ALT  --   --   --  47*  --   --   --   --   --   ALKPHOS  --   --   --  45  --   --   --   --   --   BILITOT  --   --   --  1.3*  --   --   --   --   --   BILIDIR  --   --   --  0.2  --   --   --   --   --   IBILI  --   --   --  1.1*  --   --   --   --   --    < > = values in this interval not  displayed.    RADIOGRAPHIC STUDIES: I have personally reviewed the radiological images as listed and agreed with the findings in the report. CT HEAD WO CONTRAST  Result Date: 04/18/2020 CLINICAL DATA:  Brain tumor.  Worsening headaches EXAM: CT HEAD WITHOUT CONTRAST TECHNIQUE: Contiguous axial images were obtained from the base of the skull through the vertex without intravenous contrast. COMPARISON:  MRI head 04/14/2020.  CT head 04/13/2020 FINDINGS: Brain: Large hypodense mass in the right temporoparietal lobe is unchanged. Surrounding low-density edema also unchanged. 4 mm midline shift to the left slightly improved likely due to steroid treatment. There is mass-effect on the right lateral ventricle which is compressed. The mass abuts choroid calcification in the atrium of the right lateral ventricle. There is trapping of the right temporal horn which is unchanged. Pericallosal lipoma noted incidentally. No acute hemorrhage or infarction. Vascular: Negative for hyperdense vessel Skull:  No focal skeletal lesion. Sinuses/Orbits:Negative Other: None IMPRESSION: Large mass in the right temporoparietal lobe is unchanged. Surrounding edema unchanged. There is decreased mass-effect and midline shift which is now 4 mm. Electronically Signed   By: Franchot Gallo M.D.   On: 04/18/2020 09:34   CT Head Wo Contrast  Result Date: 04/13/2020 CLINICAL DATA:  Headache EXAM: CT HEAD WITHOUT CONTRAST TECHNIQUE: Contiguous axial images were obtained from the base of the skull through the vertex without intravenous contrast. COMPARISON:  MRI 02/20/2012 FINDINGS: Brain: No acute intracranial hemorrhage. Large hypodense mass with peripheral soft tissue density within the right temporoparietal region, measures approximately 5.2 x  4.7 x 3.7 cm. Surrounding hypodense edema within the white matter of the right temporal and parietal lobes with involvement of right basal ganglia and thalamus. Compression of the right lateral  ventricle with possible mild trapped appearance of the right temporal horn. Approximately 6 mm midline shift to the left. Generalized sulcal edema on the right consistent with brain swelling. Vascular: No hyperdense vessels. Scattered carotid vascular calcification Skull: Normal. Negative for fracture or focal lesion. Sinuses/Orbits: No acute finding. Other: None IMPRESSION: 1. Large mass within the right temporoparietal region with significant surrounding edema and localized mass effect, including compression of right lateral ventricle and about 6 mm midline shift to the left. Generalized sulcal effacement in the right hemisphere consistent with edema. Mild asymmetric enlargement of the right temporal horn of the lateral ventricle. Critical Value/emergent results were called by telephone at the time of interpretation on 04/13/2020 at 9:15 pm to provider [facility] , who verbally acknowledged these results. Electronically Signed   By: Donavan Foil M.D.   On: 04/13/2020 21:15   MR BRAIN W WO CONTRAST  Result Date: 04/20/2020 CLINICAL DATA:  Right temporal craniotomy for resection of tumor. EXAM: MRI HEAD WITHOUT AND WITH CONTRAST TECHNIQUE: Multiplanar, multiecho pulse sequences of the brain and surrounding structures were obtained without and with intravenous contrast. CONTRAST:  7.4m GADAVIST GADOBUTROL 1 MMOL/ML IV SOLN COMPARISON:  MR head without and with contrast 04/18/2020. FINDINGS: Brain: The patient is status post right temporoparietal craniotomy for resection of tumor. There is significant debulking of the tumor. Some residual enhancement is noted along the medial and anterior inferior aspects of the resection cavity. T1 hyperintense blood products are noted posteriorly within the surgical cavity. Additional mixed intensity blood products are as expected. Surrounding edematous changes in the anterior right temporal tip and internal capsule bilaterally are stable. Dural enhancement is present  along the surgical margin and extending anteriorly to the temporal lobe. No distal metastatic lesions are present Residual midline shift measures 7 mm. Restricted diffusion is associated with the resection cavity and blood products. Restricted diffusion is noted in the peripheral areas of residual enhancement. Vascular: Flow is present in the major intracranial arteries. Skull and upper cervical spine: The craniocervical junction is normal. Upper cervical spine is within normal limits. Marrow signal is unremarkable. Sinuses/Orbits: The paranasal sinuses and mastoid air cells are clear. The globes and orbits are within normal limits. IMPRESSION: 1. Status post right temporoparietal craniotomy for resection of tumor. 2. Residual enhancement along the medial and anterior inferior aspects of the resection cavity. This may represent residual tumor. 3. Stable vasogenic edematous changes in the anterior right temporal tip and internal capsule bilaterally. 4. Residual midline shift measures 7 mm. 5. No distal lesions are present. Electronically Signed   By: CSan MorelleM.D.   On: 04/20/2020 07:50   MR BRAIN W WO CONTRAST  Result Date: 04/18/2020 CLINICAL DATA:  Brain tumor. Brain lab protocol. Additional history provided: Severe headaches. EXAM: MRI HEAD WITHOUT AND WITH CONTRAST TECHNIQUE: Multiplanar, multiecho pulse sequences of the brain and surrounding structures were obtained without and with intravenous contrast. CONTRAST:  7.6101mGADAVIST GADOBUTROL 1 MMOL/ML IV SOLN COMPARISON:  Noncontrast head CT 04/18/2020, brain MRI 04/14/2020 FINDINGS: Brain: Again demonstrated is a heterogeneous predominantly T2 hyperintense mass centered within the right periatrial region. The mass measures slightly smaller as compared to prior MRI 04/14/2020, 6.2 x 4.5 x 6.0 cm on the current examination (previously measured at 6.6 x 4.9 x 6.0 cm). As before, the  mass demonstrates predominantly peripheral enhancement.  Redemonstrated internal regions of restricted diffusion and SWI signal loss consistent with necrosis. Redemonstrated polypoid extension of the mass laterally, possibly contacting the adjacent dura. Similar appearance of surrounding T2/FLAIR parenchymal hyperintensity compatible with vasogenic edema and/or nonenhancing infiltrative tumor. Persistent although decreased mass effect with persistent partial effacement of the right lateral ventricle. Unchanged focal dilation of the right temporal horn without definite transependymal flow of CSF. As before, ependymal extension is difficult to exclude given mass effect upon the right lateral ventricle and adjacent enhancing choroid plexus. There is no acute infarct. No extra-axial fluid collection. Redemonstrated small pericallosal lipoma. Vascular: Expected proximal arterial flow voids. Skull and upper cervical spine: No focal marrow lesion. Sinuses/Orbits: Visualized orbits show no acute finding. Mild scattered paranasal sinus mucosal thickening, most notably within the ethmoid air cells. No significant mastoid effusion. IMPRESSION: A 6.2 x 4.5 x 6.0 cm mass centered within the right periatrial region measures similar to slightly smaller as compared to the MRI of 04/14/2020. Please see detailed description within the findings section above. Findings are highly concerning for high-grade glioma/GBM. Similar appearance of surrounding T2/FLAIR hyperintensity compatible with vasogenic edema and/or nonenhancing infiltrative tumor. Persistent although decreased mass effect with partial effacement of the right lateral ventricle and now 4 mm leftward midline shift. Unchanged dilation of the right temporal horn without transependymal flow of CSF. Electronically Signed   By: Kellie Simmering DO   On: 04/18/2020 14:09   MR Brain W and Wo Contrast  Result Date: 04/14/2020 CLINICAL DATA:  Initial evaluation for brain mass, headache. 6.6 x 4.9 x 6.0 EXAM: MRI HEAD WITHOUT AND WITH  CONTRAST TECHNIQUE: Multiplanar, multiecho pulse sequences of the brain and surrounding structures were obtained without and with intravenous contrast. CONTRAST:  7.48m GADAVIST GADOBUTROL 1 MMOL/ML IV SOLN COMPARISON:  Prior CT from 04/13/2020. FINDINGS: Brain: Cerebral volume within normal limits for age. No significant cerebral white matter disease. Heterogeneous predominant T2 hyperintense mass centered at the right periatrial region is seen. Lesion measures 6.6 x 4.9 x 6.0 cm in greatest dimensions (AP by transverse by craniocaudad). Lesion demonstrates heterogeneous T2 hyperintense signal intensity with avid irregular in predominant peripheral postcontrast enhancement. Scattered areas of internal restricted diffusion and susceptibility artifact consistent with necrosis. Polypoid extension of the lesion towards the overlying dura seen at the lateral margin of the lesion (series 12, image 24). Surrounding T2/FLAIR hyperintensity compatible with vasogenic edema and/or infiltrating nonenhancing tumor. Mass effect on the adjacent right lateral ventricle which is partially effaced and compressed. No definite ependymal extension, although difficult to be certain given mass effect an adjacent enhancing choroid plexus. Associated right-to-left midline shift measures up to 8 mm. Focal dilatation of the temporal horn of the right lateral ventricle without transependymal flow of CSF. No hydrocephalus. No other abnormal enhancement within the brain. Small. Callosum lipoma noted. No other discrete mass lesion or mass effect. No evidence for acute or subacute infarct. Gray-white matter differentiation maintained elsewhere throughout the brain. No other areas of chronic infarction. No other foci of susceptibility artifact to suggest acute or chronic intracranial hemorrhage. Pituitary gland within normal limits. Vascular: Major intracranial vascular flow voids are maintained. Skull and upper cervical spine: Craniocervical  junction within normal limits. Upper cervical spine normal. Bone marrow signal intensity within normal limits. No scalp soft tissue abnormality. Sinuses/Orbits: Globes and orbital soft tissues within normal limits. Paranasal sinuses are largely clear. No significant mastoid effusion. Inner ear structures grossly normal. Other: None. IMPRESSION: 1. 6.6 x  4.9 x 6.0 cm mass centered at the right periatrial region, highly concerning for high-grade glioma/GBM. Surrounding T2/FLAIR hyperintensity compatible with vasogenic edema and/or infiltrating nonenhancing tumor. Associated regional mass effect with 8 mm of right-to-left midline shift. Dilatation of the right temporal horn without transependymal flow of CSF. 2. Otherwise unremarkable brain MRI. No other acute intracranial abnormality identified. Electronically Signed   By: Jeannine Boga M.D.   On: 04/14/2020 00:52    ASSESSMENT & PLAN:  Right Temporal Mass  Terry Bell presents with clinical syndrome suggestive of right temporal primary brain tumor.  She underwent successful craniotomy and resection yesterday.  Post-operative imaging is suggestive of gross total resection with some residual blood products.    We expect she will continue to regain strength and functional status over the coming days with the help of physical and occupational therapy.    Her case will be reviewed in brain/spine tumor board this week, including pathology if available by Monday.   We will continue to follow along as she recovers and transitions care.  All questions were answered. The patient knows to call the clinic with any problems, questions or concerns.  The total time spent in the encounter was 55 minutes and more than 50% was on counseling and review of test results     Ventura Sellers, MD 04/20/2020 3:19 PM

## 2020-04-20 NOTE — Plan of Care (Signed)
  Problem: Clinical Measurements: Goal: Ability to maintain clinical measurements within normal limits will improve Outcome: Progressing   Problem: Activity: Goal: Risk for activity intolerance will decrease Outcome: Progressing   Problem: Pain Managment: Goal: General experience of comfort will improve Outcome: Progressing   

## 2020-04-20 NOTE — Discharge Summary (Signed)
Physician Discharge Summary  Patient ID: Terry Bell MRN: [id number] DOB/AGE: [birth year] 64 y.o.  Admit date: 04/19/2020 Discharge date: 04/20/2020  Admission Diagnoses:Brain tumor  Discharge Diagnoses: Glioblastoma Active Problems:   Brain tumor, glioma [facility])   Discharged Condition: good  Hospital Course: Terry Bell was admitted and taken to the operating room for resection of a brain tumor. Preliminary pathology states this is a grade lV glioma. Post op she is ambulating, voiding, and tolerating a regular diet. Her wound is clean, dry, and without signs of infection.   Treatments: surgery: right craniotomy for tumor resection  Discharge Exam: Blood pressure 113/73, pulse (!) 57, temperature 99.9 F (37.7 C), temperature source Oral, resp. rate 16, height 5' 6.5" (1.689 m), weight 79.2 kg, SpO2 96 %. General appearance: alert, cooperative, appears stated age and mild distress Neurologic: Cranial nerves: II: visual field normal, left hemianopsia  Disposition: Discharge disposition: 01-Home or Self Care      Brain tumor  Allergies as of 04/20/2020      Reactions   Niacin Hives, Rash      Medication List    STOP taking these medications   traMADol 50 MG tablet Commonly known as: Ultram     TAKE these medications   acetaminophen 325 MG tablet Commonly known as: TYLENOL Take 2 tablets (650 mg total) by mouth every 6 (six) hours as needed for up to 14 days for mild pain (or Fever >/= 101).   Align 4 MG Caps Take 4 mg by mouth daily.   cetirizine 10 MG tablet Commonly known as: ZYRTEC Take 10 mg by mouth daily as needed for allergies.   dexamethasone 4 MG tablet Commonly known as: DECADRON Take 1 tablet (4 mg total) by mouth 2 (two) times daily. What changed: when to take this   Fish Oil 1000 MG Caps Take 1,000 mg by mouth daily.   levETIRAcetam 500 MG tablet Commonly known as: Keppra Take 1 tablet (500 mg total) by mouth every 12 (twelve) hours.    melatonin 3 MG Tabs tablet Take 1 tablet (3 mg total) by mouth at bedtime as needed for up to 14 days. What changed: reasons to take this   oxyCODONE 5 MG immediate release tablet Commonly known as: Oxy IR/ROXICODONE Take 1 tablet (5 mg total) by mouth every 6 (six) hours as needed for up to 7 days for severe pain.   pantoprazole 40 MG tablet Commonly known as: Protonix Take 1 tablet (40 mg total) by mouth daily for 14 days.   TURMERIC PO Take 538 mg by mouth daily.   Vitamin D 1000 units capsule Take 1,000 Units by mouth 2 (two) times daily.   vitamin E 180 MG (400 UNITS) capsule Take 800 Units by mouth daily.        SignedAshok Pall 04/20/2020, 8:35 PM

## 2020-04-20 NOTE — Discharge Instructions (Signed)
Craniotomy Care After Please read the instructions outlined below and refer to this sheet in the next few weeks. These discharge instructions provide you with general information on caring for yourself after you leave the hospital. Your surgeon may also give you specific instructions. While your treatment has been planned according to the most current medical practices available, unavoidable complications occasionally occur. If you have any problems or questions after discharge, please call your surgeon. Although there are many types of brain surgery, recovery following craniotomy (surgical opening of the skull) is much the same for each. However, recovery depends on many factors. These include the type and severity of brain injury and the type of surgery. It also depends on any nervous system function problems (neurological deficits) before surgery. If the craniotomy was done for cancer, chemotherapy and radiation could follow. You could be in the hospital from 5 days to a couple weeks. This depends on the type of surgery, findings, and whether there are complications. HOME CARE INSTRUCTIONS   It is not unusual to hear a clicking noise after a craniotomy, the plates and screws used to attach the bone flap can sometimes cause this. It is a normal occurrence if this does happen  Do not drive for 10 days after the operation  Your scalp may feel spongy for a while, because of fluid under it. This will gradually get better. Occasionally, the surgeon will not replace the bone that was removed to access the brain. If there is a bony defect, the surgeon will ask you to wear a helmet for protection. This is a discussion you should have with your surgeon prior to leaving the hospital (discharge).  Numbness may persist in some areas of your scalp.  Take all medications as directed. Sometimes steroids to control swelling are prescribed. Anticonvulsants to prevent seizures may also be given. Do not use alcohol,  other drugs, or medications unless your surgeon says it is OK.  Keep the wound dry and clean. The wound may be washed gently with soap and water. Then, you may gently blot or dab it dry, without rubbing. Do not take baths, use swimming pools or hot tubs for 10 days, or as instructed by your caregiver. It is best to wait to see you surgeon at your first postoperative visit, and to get directions at that time.  Only take over-the-counter or prescription medicines for pain, discomfort, or fever as directed by your caregiver.  You may continue your normal diet, as directed.  Walking is OK for exercise. Wait at least 3 months before you return to mild, non-contact sports or as your surgeon suggests. Contact sports should be avoided for at least 1 year, unless your surgeon says it is OK.  If you are prescribed steroids, take them exactly as prescribed. If you start having a decrease in nervous system functions (neurological deficits) and headaches as the dose of steroids is reduced, tell your surgeon right away.  When the anticonvulsant prescription is finished you no longer need to take it. SEEK IMMEDIATE MEDICAL CARE IF:   You develop nausea, vomiting, severe headaches, confusion, or you have a seizure.  You develop chest pain, a stiff neck, or difficulty breathing.  There is redness, swelling, or increasing pain in the wound or pin insertion sites.  You have an increase in swelling or bruising around the eyes.  There is drainage or pus coming from the wound.  You have an oral temperature above 102 F (38.9 C), not controlled by medicine.  You notice a foul smell coming from the wound or dressing.  The wound breaks open (edges not staying together) after the stitches have been removed.  You develop dizziness or fainting while standing.  You develop a rash.  You develop any reaction or side effects to the medications given. Document Released: 01/13/2006 Document Revised: 01/05/2012  Document Reviewed: 10/22/2009 [facility] Patient Information 2013 [place].

## 2020-04-21 ENCOUNTER — Other Ambulatory Visit (HOSPITAL_COMMUNITY): Payer: Self-pay | Admitting: Neurological Surgery

## 2020-04-21 LAB — BASIC METABOLIC PANEL
Anion gap: 13 (ref 5–15)
BUN: 13 mg/dL (ref 8–23)
CO2: 21 mmol/L — ABNORMAL LOW (ref 22–32)
Calcium: 8.8 mg/dL — ABNORMAL LOW (ref 8.9–10.3)
Chloride: 101 mmol/L (ref 98–111)
Creatinine, Ser: 0.74 mg/dL (ref 0.44–1.00)
GFR calc Af Amer: >60 mL/min (ref >60)
GFR calc non Af Amer: >60 mL/min (ref >60)
Glucose, Bld: 102 mg/dL — ABNORMAL HIGH (ref 70–99)
Potassium: 4.2 mmol/L (ref 3.5–5.1)
Sodium: 135 mmol/L (ref 135–145)

## 2020-04-21 MED ORDER — LEVETIRACETAM 500 MG PO TABS
500.0000 mg | ORAL_TABLET | Freq: Two times a day (BID) | ORAL | Status: DC
  Administered 2020-04-21: 500 mg via ORAL
  Filled 2020-04-21: qty 1

## 2020-04-21 MED ORDER — LEVETIRACETAM 500 MG PO TABS: 500.0000 mg | ORAL_TABLET | Freq: Two times a day (BID) | ORAL | 3 refills | Status: DC

## 2020-04-21 MED ORDER — LEVETIRACETAM 100 MG/ML PO SOLN: 500.0000 mg | Freq: Two times a day (BID) | ORAL | Status: DC

## 2020-04-21 MED ORDER — ONDANSETRON HCL 4 MG PO TABS: 4.0000 mg | ORAL_TABLET | Freq: Three times a day (TID) | ORAL | 1 refills | Status: DC | PRN

## 2020-04-21 MED ORDER — ONDANSETRON HCL 4 MG PO TABS: 4.0000 mg | ORAL_TABLET | Freq: Three times a day (TID) | ORAL | 0 refills | Status: DC | PRN

## 2020-04-21 NOTE — Evaluation (Signed)
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 PMH includes arthritis, depression, OSA, R knee arthroscopy.   Clinical Impression   PTA pt living with family, fully independent and working as Therapist, sports. At time of eval, pt very limited by pain. She was able to complete bed mobility at min guard- min A with cues for safety. She completed functional mobility to bathroom with steadying assist (pt tends to rush to sit down due to pain). She required min A for UB dressing. While standing in bathroom, pt became very overwhelmed and nauseous by pain. She then began to rush back to bed with min A and cues to maintain safe transfer back to bed. No emesis. Noted L visual field impairment with functional assessment (pt could not tolerate formal assessment due to pain and nausea). Pt could not tolerate further OT assessment due to pain, RN was informed to check on pain medicine for pt. Recommend she follow up with OP OT to continue to progress visual assessment and BADL. Will continue to follow per POC listed below.     Follow Up Recommendations  Outpatient OT;Other (comment) (neuro outpatient)    Equipment Recommendations  Tub/shower seat    Recommendations for Other Services       Precautions / Restrictions Precautions Precautions: Fall Precaution Comments: scalp incision Restrictions Weight Bearing Restrictions: No      Mobility Bed Mobility Overal bed mobility: Needs Assistance Bed Mobility: Supine to Sit;Sit to Supine     Supine to sit: Min guard Sit to supine: Min assist   General bed mobility comments: pt able to come to EOB Without physical assist, cues to pace self to adjust to position change. Min A needed to return to bed due to unsteadiness and safety from internal distraction to pain  Transfers Overall  transfer level: Needs assistance Equipment used: 1 person hand held assist Transfers: Sit to/from Stand Sit to Stand: Min guard;Min assist         General transfer comment: fluctuating min guard-min A to maintain steadying    Balance Overall balance assessment: Needs assistance Sitting-balance support: Feet supported Sitting balance-Leahy Scale: Fair     Standing balance support: During functional activity;Single extremity supported   Standing balance comment: single UE support from OT to maintain standing balance                           ADL either performed or assessed with clinical judgement   ADL Overall ADL's : Needs assistance/impaired Eating/Feeding: Set up;Sitting   Grooming: Set up;Sitting;Standing Grooming Details (indicate cue type and reason): limited standing tolerance 2/2 pain Upper Body Bathing: Minimal assistance;Sitting;Standing   Lower Body Bathing: Minimal assistance;Sit to/from stand;Sitting/lateral leans   Upper Body Dressing : Minimal assistance;Sitting Upper Body Dressing Details (indicate cue type and reason): to don her personal night gown. Pt keeping eyes clothes and internally distracted by pain Lower Body Dressing: Minimal assistance;Sit to/from stand Lower Body Dressing Details (indicate cue type and reason): to don underwear and pad Toilet Transfer: Min guard;Regular Toilet;Grab bars Toilet Transfer Details (indicate cue type and reason): pt reaching for grab bars to steady self. Min guard for safety and steadying Toileting- Clothing Manipulation and Hygiene: Set up;Sitting/lateral lean       Functional mobility during ADLs: Min guard;Cueing for safety General ADL Comments:  noted L peripheral visual impairment impacting BADLs     Vision Baseline Vision/History: No visual deficits Patient Visual Report: Peripheral vision impairment;Other (comment) (pt was not aware of this without cueing) Vision Assessment?: Yes Eye Alignment:  Within Functional Limits Ocular Range of Motion: Within Functional Limits Tracking/Visual Pursuits: Decreased smoothness of eye movement to LEFT superior field;Decreased smoothness of eye movement to LEFT inferior field Visual Fields: Left visual field deficit;Impaired-to be further tested in functional context Additional Comments: noted L peripheral vision impairement. Pt reaching for objects with poor targeting on L side. Cues needed to turn head. Pt internally distracted by pain and keeping eyes closed frequently in session preventing more thorough assessment.     Perception     Praxis      Pertinent Vitals/Pain Pain Assessment: 0-10 Pain Score: 6  Pain Location: HA Pain Descriptors / Indicators: Aching;Sore Pain Intervention(s): Limited activity within patient's tolerance;Monitored during session;Repositioned     Hand Dominance Right   Extremity/Trunk Assessment Upper Extremity Assessment Upper Extremity Assessment: Generalized weakness (no significant unilateral deficits noted)   Lower Extremity Assessment Lower Extremity Assessment: Defer to PT evaluation       Communication Communication Communication: No difficulties   Cognition Arousal/Alertness: Awake/alert Behavior During Therapy: WFL for tasks assessed/performed Overall Cognitive Status: Impaired/Different from baseline Area of Impairment: Safety/judgement;Problem solving                         Safety/Judgement: Decreased awareness of safety;Decreased awareness of deficits   Problem Solving: Requires verbal cues General Comments: pt very internally distracted by pain, with limited awareness to safety during this time. Pt rushing to complete movements and lie back down due to experiencing increased pain/nausea   General Comments  VSS, poorly controlled pain    Exercises     Shoulder Instructions      Home Living Family/patient expects to be discharged to:: Private residence Living  Arrangements: Spouse/significant other;Children;Other relatives (2 daughters, grandfather) Available Help at Discharge: Family;Available 24 hours/day Type of Home: House Home Access: Stairs to enter [employer] of Steps: 5   Home Layout: Two level;Able to live on main level with bedroom/bathroom;Bed/bath upstairs Alternate Level Stairs-Number of Steps: about 20 steps, oversized foyer   Bathroom Shower/Tub: Tub/shower unit;Walk-in shower   Bathroom Toilet: Standard     Home Equipment: Hand held shower head          Prior Functioning/Environment Level of Independence: Independent        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, 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 [employer]        OT Problem List: Impaired vision/perception;Decreased knowledge of use of DME or AE;Decreased knowledge of precautions;Decreased activity tolerance;Decreased cognition;Impaired balance (sitting and/or standing);Pain;Decreased safety awareness      OT Treatment/Interventions: Self-care/ADL training;Therapeutic exercise;Patient/family education;Balance training;Neuromuscular education;Therapeutic activities;Energy conservation;DME and/or AE instruction;Cognitive remediation/compensation    OT Goals(Current goals can be found in the care plan section) Acute Rehab OT Goals Patient Stated Goal: return to independence OT Goal Formulation: With patient Time For Goal Achievement: 05/05/20 Potential to Achieve Goals: Good  OT Frequency: Min 2X/week   Barriers to D/C:            Co-evaluation              AM-PAC OT "6 Clicks" Daily Activity     Outcome Measure Help from another person eating meals?: A Little Help from another person taking care of personal grooming?: A Little Help from another person toileting,  which includes using toliet, bedpan, or urinal?: A Little Help from another person bathing (including washing, rinsing, drying)?: A Little Help from another person to put on and taking off regular upper body  clothing?: A Little Help from another person to put on and taking off regular lower body clothing?: A Little 6 Click Score: 18   End of Session Nurse Communication: Mobility status  Activity Tolerance: Patient limited by pain Patient left: in bed;with call bell/phone within reach;with family/visitor present  OT Visit Diagnosis: Unsteadiness on feet (R26.81);Other symptoms and signs involving the nervous system (R29.898);Pain Pain - part of body:  (HA)                Time: 5697-9480 OT Time Calculation (min): 26 min Charges:  OT General Charges $OT Visit: 1 Visit OT Evaluation $OT Eval Moderate Complexity: 1 Mod OT Treatments $Self Care/Home Management : 8-22 mins  Zenovia Jarred, MSOT, OTR/L Acute Rehabilitation Services [facility] Office Number: [phone] Pager: [phone]  Zenovia Jarred 04/21/2020, 9:47 AM

## 2020-04-21 NOTE — TOC Transition Note (Signed)
Transition of Care [facility]) - CM/SW Discharge Note   Patient Details  Name: Terry Bell MRN: [id number] Date of Birth: Apr 13, 1956  Transition of Care [facility]) CM/SW Contact:  Claudie Leach, RN 04/21/2020, 12:16 PM   Clinical Narrative:    Patient to d/c home with family supervision and OPPT.  Discussed with husband.  Referral sent to OPPT.  Husband declines shower seat as they already have one.     Final next level of care: OP Rehab Barriers to Discharge: No Barriers Identified

## 2020-04-21 NOTE — Plan of Care (Signed)

## 2020-04-21 NOTE — Evaluation (Signed)
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 PMH includes arthritis, depression, OSA, R knee arthroscopy.  Clinical Impression   Patient evaluated by Physical Therapy with no further acute PT needs identified. All education has been completed and the patient has no further questions. Husband and daughter present during session for education on min-guard assist whenever pt is up due to visual deficit on her left putting her at incr risk for falling. Educated she may progress to not needing OPPT by the time she would be seen at OP. Recommend she have both PT and OT evaluations at OP and they can determine best discipline to address her deficits). See below for any follow-up Physical Therapy or equipment needs. PT is signing off. Thank you for this referral.     Follow Up Recommendations Outpatient PT;Supervision/Assistance - 24 hour    Equipment Recommendations  None recommended by PT    Recommendations for Other Services       Precautions / Restrictions Precautions Precautions: Fall Precaution Comments: scalp incision Restrictions Weight Bearing Restrictions: No      Mobility  Bed Mobility Overal bed mobility: Needs Assistance Bed Mobility: Supine to Sit;Sit to Supine     Supine to sit: Supervision Sit to supine: Min assist;Supervision   General bed mobility comments: supervision for safety due to earlier rushing and decr vision on her left  Transfers Overall transfer level: Needs assistance Equipment used: 1 person hand held assist;None Transfers: Sit to/from Stand Sit to Stand: Min guard         General transfer comment: from bed, from comfort ht toilet  Ambulation/Gait Ambulation/Gait assistance: Min guard Gait Distance (Feet): 15 Feet (x2) Assistive device: 1 person  hand held assist;None Gait Pattern/deviations: Step-through pattern;Decreased stride length     General Gait Details: closeguarding for safety to help pt avoid objects on her left; educated family to be on her left initially with cues for her to scan her left environment (esp doorframes) to avoid running into things/LOB/fall; no imbalance noted   Stairs Stairs:  (pt demonstrated adequate strength and balance to complete st)          Wheelchair Mobility    Modified Rankin (Stroke Patients Only)       Balance Overall balance assessment: Needs assistance Sitting-balance support: Feet supported Sitting balance-Leahy Scale: Normal Sitting balance - Comments: able to don socks in sitting   Standing balance support: During functional activity;No upper extremity supported Standing balance-Leahy Scale: Good Standing balance comment: reaching outside BOS for washing hands (turn on/off water, soap, paper towels)                             Pertinent Vitals/Pain Pain Assessment: 0-10 Pain Score: 4  Pain Location: HA Pain Descriptors / Indicators: Aching;Sore Pain Intervention(s): Limited activity within patient's tolerance;Monitored during session;Premedicated before session;Repositioned    Home Living Family/patient expects to be discharged to:: Private residence Living Arrangements: Spouse/significant other;Children;Other relatives (2 daughters, grandfather) Available Help at Discharge: Family;Available 24 hours/day Type of Home: House Home Access: Stairs to enter   [employer] of Steps: 5 Home Layout: Two level;Bed/bath upstairs Home Equipment: Hand held shower head      Prior Function Level of Independence: Independent         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, 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[employer]     Hand Dominance   Dominant Hand: Right    Extremity/Trunk Assessment   Upper Extremity Assessment Upper Extremity Assessment: Defer to OT evaluation     Lower Extremity Assessment Lower Extremity Assessment: Overall WFL for tasks assessed    Cervical / Trunk Assessment Cervical / Trunk Assessment: Normal  Communication   Communication: No difficulties  Cognition Arousal/Alertness: Awake/alert Behavior During Therapy: WFL for tasks assessed/performed Overall Cognitive Status: Impaired/Different from baseline Area of Impairment: Safety/judgement                         Safety/Judgement: Decreased awareness of safety;Decreased awareness of deficits   Problem Solving: Requires verbal cues General Comments: better safety awareness than described during OT session (moving more slowly, cautiously)      General Comments General comments (skin integrity, edema, etc.): VSS; headache limiting activity tolerance. Husband and daughter present and educated on 24/7 assist when up (including consider bell system, baby monitor, etc for pt to contact family members for assist).     Exercises     Assessment/Plan    PT Assessment All further PT needs can be met in the next venue of care  PT Problem List Decreased balance;Decreased safety awareness;Other (comment) (decreased vision impacting balance/safety with mobility)       PT Treatment Interventions      PT Goals (Current goals can be found in the Care Plan section)  Acute Rehab PT Goals Patient Stated Goal: return to independence PT Goal Formulation: All assessment and education complete, DC therapy    Frequency     Barriers to discharge        Co-evaluation               AM-PAC PT "6 Clicks" Mobility  Outcome Measure Help needed turning from your back to your side while in a flat bed without using bedrails?: None Help needed moving from lying on your back to sitting on the side of a flat bed without using bedrails?: None Help needed moving to and from a bed to a chair (including a wheelchair)?: A Little Help needed standing up from a chair using your arms  (e.g., wheelchair or bedside chair)?: A Little Help needed to walk in hospital room?: A Little Help needed climbing 3-5 steps with a railing? : A Little 6 Click Score: 20    End of Session   Activity Tolerance: Patient limited by pain Patient left: in bed;with call bell/phone within reach;with family/visitor present Nurse Communication: Mobility status;Other (comment) (Ok to dc from PT perspective) PT Visit Diagnosis: Difficulty in walking, not elsewhere classified (R26.2)    Time: 6387-5643 PT Time Calculation (min) (ACUTE ONLY): 18 min   Charges:   PT Evaluation $PT Eval Low Complexity: 1 Low           Arby Barrette, PT Pager [phone]   Rexanne Mano 04/21/2020, 12:04 PM

## 2020-04-23 ENCOUNTER — Encounter: Payer: Self-pay | Admitting: *Deleted

## 2020-04-23 ENCOUNTER — Inpatient Hospital Stay: Payer: 59 | Attending: Internal Medicine

## 2020-04-23 ENCOUNTER — Other Ambulatory Visit: Payer: Self-pay | Admitting: *Deleted

## 2020-04-23 NOTE — Patient Outreach (Signed)
Terry Bell) Care Management  04/23/2020  Terry Bell 11/10/1955 [id number]   Transition of care call/case closure   Referral received:04/17/20 Initial outreach:04/23/20 Insurance: UMR    Subjective: Initial successful telephone call to patient's preferred number in order to complete transition of care assessment; 2 HIPAA identifiers verified. Explained purpose of call and completed transition of care assessment.  Terry Bell states that this is a good day, the best she has felt.  denies post-operative problems,she states her incision as dressing in place, not noted signs of infection at signed observed by family. She  states surgical pain  managed with prescribed medications of oxycodone. She reports doing okay with diet taking it slow, eating small amounts at a time. She denies having nausea but has prescribed Zofran for use as needed. She denies bowel or bladder problems.  Spouse/daughters  are providing 24 hours support after discharge. Terry Bell reports tolerating standby assistance for mobility in the home, without use of device.   She requested that I speak with her daughter Terry Bell, to complete assessment.. She reports being unsure if she has hospital indemnity plan, provided contact number to file case if needed with [facility] at [place].  She  uses a [facility] outpatient pharmacy at [facility] outpatient pharmacy.   Objective:  Terry Bell  was hospitalized at [facility] from 6/24-6/26/21 for Right temporal Craniotomy for tumor resection , Glioma.  ED  visit at [facility] on 6/18 for /severe Headache found right temporal parietal mass on CT   Past Medical history significant for:  NASH, Osteoarthritis, headache .  She was discharged to home on 04/21/20 with referral for outpatient physical therapy at [facility] services.    Assessment:  Patient voices good understanding of all discharge instructions.  See transition of care flowsheet for assessment  details.Patient daughter assisting post discharge care and appointment follow up.    Plan:  Reviewed hospital discharge diagnosis of Right temporal craniotomy for tumor resection , Glioma  and discharge treatment plan using hospital discharge instructions, assessing medication adherence, reviewing problems requiring provider notification, and discussing the importance of follow up with surgeon, primary care provider and/or specialists as directed.  Reviewed [place] healthy lifestyle program information to receive discounted premium for  2022   Step 1: Get  your annual physical  Step 2: Complete your health assessment  Step 3:Identify your current health status and complete the corresponding action step between January 1, and June 27, 2020.    No ongoing care management needs identified so will close case to [place] [facility] services and route successful outreach letter with [place] [employer] pamphlet and 24 Hour Nurse Line Magnet to [place] [facility] clinical pool to be mailed to patient's home address.  Thanked patient for their services to [facility].   Joylene Draft, RN, BSN  [place] Management Coordinator  [phone]- Mobile [phone]- Toll Free Main Office

## 2020-04-26 ENCOUNTER — Other Ambulatory Visit: Payer: Self-pay

## 2020-04-26 ENCOUNTER — Inpatient Hospital Stay: Payer: 59 | Attending: Internal Medicine | Admitting: Internal Medicine

## 2020-04-26 VITALS — BP 134/92 | HR 58 | Temp 97.8°F | Resp 17 | Ht 66.5 in | Wt 168.6 lb

## 2020-04-26 DIAGNOSIS — C719 Malignant neoplasm of brain, unspecified: Secondary | ICD-10-CM

## 2020-04-26 DIAGNOSIS — Z833 Family history of diabetes mellitus: Secondary | ICD-10-CM | POA: Diagnosis not present

## 2020-04-26 DIAGNOSIS — Z79899 Other long term (current) drug therapy: Secondary | ICD-10-CM | POA: Diagnosis not present

## 2020-04-26 DIAGNOSIS — Z809 Family history of malignant neoplasm, unspecified: Secondary | ICD-10-CM | POA: Diagnosis not present

## 2020-04-26 DIAGNOSIS — C712 Malignant neoplasm of temporal lobe: Secondary | ICD-10-CM | POA: Diagnosis not present

## 2020-04-26 DIAGNOSIS — G4733 Obstructive sleep apnea (adult) (pediatric): Secondary | ICD-10-CM | POA: Insufficient documentation

## 2020-04-26 DIAGNOSIS — Z8249 Family history of ischemic heart disease and other diseases of the circulatory system: Secondary | ICD-10-CM | POA: Diagnosis not present

## 2020-04-26 DIAGNOSIS — K7581 Nonalcoholic steatohepatitis (NASH): Secondary | ICD-10-CM | POA: Insufficient documentation

## 2020-04-26 DIAGNOSIS — Z7952 Long term (current) use of systemic steroids: Secondary | ICD-10-CM | POA: Insufficient documentation

## 2020-04-26 NOTE — Progress Notes (Signed)
[facility] [place], [place] [address] [phone]   New Patient Evaluation  Date of Service: 04/26/20 Patient Name: Terry Bell Patient MRN: [id number] Patient DOB: July 23, 1956 Provider: Ventura Sellers, MD  Identifying Statement:  Terry Bell is a 64 y.o. female with right temporal glioblastoma who presents for initial consultation and evaluation.    Referring Provider: Martinique, Betty G, MD [place],  [place] [address]  Oncologic History: Oncology History  Glioblastoma [facility])  04/13/2020 Initial Diagnosis   Glioblastoma ([place])   04/19/2020 Surgery   Craniotomy, right temporal resection with Dr. Christella Noa     Biomarkers:  MGMT Unknown.  IDH 1/2 Unknown.  EGFR Unknown  TERT Unknown   History of Present Illness: The patient's records from the referring physician were obtained and reviewed and the patient interviewed to confirm this HPI.  Terry Bell presented last week with several days of progressive new-onset frontal headache.  This may be have been associated with change in taste, but no other neurologic complaints.  CNS imaging demonstrated a large right temporal mass.  She underwent resection on 04/19/20 with Dr. Christella Noa, was discharged on 6/27.  She has no complaints today aside from some residual pain at the surgical site.  Does complain of "hole in vision" on the left side, but otherwise no neurologic deficits.  Taking decadron 78m twice per day.  Medications: Current Outpatient Medications on File Prior to Visit  Medication Sig Dispense Refill   acetaminophen (TYLENOL) 325 MG tablet Take 2 tablets (650 mg total) by mouth every 6 (six) hours as needed for up to 14 days for mild pain (or Fever >/= 101).     cetirizine (ZYRTEC) 10 MG tablet Take 10 mg by mouth daily as needed for allergies.     Cholecalciferol (VITAMIN D) 1000 UNITS capsule Take 1,000 Units by mouth 2 (two) times daily.       dexamethasone (DECADRON) 4 MG tablet Take 1 tablet (4 mg total) by mouth 2 (two) times daily. 30 tablet 2   levETIRAcetam (KEPPRA) 500 MG tablet Take 1 tablet (500 mg total) by mouth 2 (two) times daily. 60 tablet 3   melatonin 3 MG TABS tablet Take 1 tablet (3 mg total) by mouth at bedtime as needed for up to 14 days. (Patient taking differently: Take 3 mg by mouth at bedtime as needed (sleep). ) 14 tablet 0   Omega-3 Fatty Acids (FISH OIL) 1000 MG CAPS Take 1,000 mg by mouth daily.     pantoprazole (PROTONIX) 40 MG tablet Take 1 tablet (40 mg total) by mouth daily for 14 days. 14 tablet 0   Probiotic Product (ALIGN) 4 MG CAPS Take 4 mg by mouth daily.     TURMERIC PO Take 538 mg by mouth daily.     vitamin E 400 UNIT capsule Take 800 Units by mouth daily.      ondansetron (ZOFRAN) 4 MG tablet Take 1 tablet (4 mg total) by mouth every 8 (eight) hours as needed for nausea or vomiting. (Patient not taking: Reported on 04/26/2020) 30 tablet 1   oxyCODONE (OXY IR/ROXICODONE) 5 MG immediate release tablet Take 1 tablet (5 mg total) by mouth every 6 (six) hours as needed for up to 7 days for severe pain. (Patient not taking: Reported on 04/26/2020) 28 tablet 0   No current facility-administered medications on file prior to visit.    Allergies:  Allergies  Allergen  Reactions   Niacin Hives and Rash   Past Medical History:  Past Medical History:  Diagnosis Date   Arthritis    Chronic nonalcoholic liver disease    Complication of anesthesia    Continuous leakage of urine    Depression    situational   Diverticulosis    OSA (obstructive sleep apnea) 07/22/2016   PONV (postoperative nausea and vomiting)    Steatohepatitis    Tinnitus    Varicose veins    Past Surgical History:  Past Surgical History:  Procedure Laterality Date   APPLICATION OF CRANIAL NAVIGATION N/A 04/19/2020   Procedure: APPLICATION OF CRANIAL NAVIGATION;  Surgeon: Ashok Pall, MD;  Location: [facility];   Service: Neurosurgery;  Laterality: N/A;   arthroscopic left knee sugery     bilateral bunionectomy     BREAST BIOPSY Left    CHOLECYSTECTOMY     colonsocopy     CRANIOTOMY Right 04/19/2020   Procedure: Right Temporal craniotomy for tumor resection;  Surgeon: Ashok Pall, MD;  Location: [facility];  Service: Neurosurgery;  Laterality: Right;   debridement and removal and lateral release of torn cartlidge in rt knee     gum grating     rt knee arthroscopy     torn cartledge in rt knee     Social History:  Social History   Socioeconomic History   Marital status: Married    Spouse name: Not on file   Number of children: Not on file   Years of education: Not on file   Highest education level: Not on file  Occupational History   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  Tobacco Use   Smoking status: Never Smoker   Smokeless tobacco: Never Used  Substance and Sexual Activity   Alcohol use: No   Drug use: No   Sexual activity: Not on file  Other Topics Concern   Not on file  Social History Narrative   -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 -       Regular exercise, healthy diet   Social Determinants of Health   Financial Resource Strain:    Difficulty of Paying Living Expenses:   Food Insecurity:    Worried About Charity fundraiser in the Last Year:    Arboriculturist in the Last Year:   Transportation Needs:    Film/video editor (Medical):    Lack of Transportation (Non-Medical):   Physical Activity:    Days of Exercise per Week:    Minutes of Exercise per Session:   Stress:    Feeling of Stress :   Social Connections:    Frequency of Communication with Friends and Family:    Frequency of Social Gatherings with Friends and Family:    Attends Religious Services:    Active Member of Clubs or Organizations:    Attends Music therapist:    Marital Status:   Intimate Partner Violence:    Fear of Current or Ex-Partner:     Emotionally Abused:    Physically Abused:    Sexually Abused:    Family History:  Family History  Problem Relation Age of Onset   Diabetes Mother    Liver disease Mother    Diabetes Sister    Liver disease Sister    Hypertension Other    Cancer Other    Hemochromatosis Other    Cancer Father        Throat   Hypertension Brother    Colon cancer Neg Hx  Breast cancer Neg Hx     Review of Systems: Constitutional: Doesn't report fevers, chills or abnormal weight loss Eyes: Doesn't report blurriness of vision Ears, nose, mouth, throat, and face: Doesn't report sore throat Respiratory: Doesn't report cough, dyspnea or wheezes Cardiovascular: Doesn't report palpitation, chest discomfort  Gastrointestinal:  Doesn't report nausea, constipation, diarrhea GU: Doesn't report incontinence Skin: Doesn't report skin rashes Neurological: Per HPI Musculoskeletal: Doesn't report joint pain Behavioral/Psych: Doesn't report anxiety  Physical Exam: Vitals:   04/26/20 1130  BP: (!) 134/92  Pulse: (!) 58  Resp: 17  Temp: 97.8 F (36.6 C)  SpO2: 100%   KPS: 90. General: Alert, cooperative, pleasant, in no acute distress Head: Normal EENT: No conjunctival injection or scleral icterus.  Lungs: Resp effort normal Cardiac: Regular rate Abdomen: Non-distended abdomen Skin: No rashes cyanosis or petechiae. Extremities: No clubbing or edema  Neurologic Exam: Mental Status: Awake, alert, attentive to examiner. Oriented to self and environment. Language is fluent with intact comprehension.  Cranial Nerves: Visual acuity is grossly normal. Left lower quadrantanopia. Extra-ocular movements intact. No ptosis. Face is symmetric Motor: Tone and bulk are normal. Power is full in both arms and legs. Reflexes are symmetric, no pathologic reflexes present.  Sensory: Intact to light touch Gait: Normal.   Labs: I have reviewed the data as listed    Component Value Date/Time   NA  135 04/21/2020 0657   K 4.2 04/21/2020 0657   CL 101 04/21/2020 0657   CO2 21 (L) 04/21/2020 0657   GLUCOSE 102 (H) 04/21/2020 0657   BUN 13 04/21/2020 0657   CREATININE 0.74 04/21/2020 0657   CALCIUM 8.8 (L) 04/21/2020 0657   PROT 7.0 04/17/2020 1049   ALBUMIN 4.2 04/17/2020 1049   AST 26 04/17/2020 1049   ALT 47 (H) 04/17/2020 1049   ALKPHOS 45 04/17/2020 1049   BILITOT 1.3 (H) 04/17/2020 1049   GFRNONAA >60 04/21/2020 0657   GFRAA >60 04/21/2020 0657   Lab Results  Component Value Date   WBC 10.2 04/19/2020   NEUTROABS 2.7 04/13/2020   HGB 14.8 04/19/2020   HCT 42.0 04/19/2020   MCV 89.0 04/19/2020   PLT 159 04/19/2020    Imaging:  CT HEAD WO CONTRAST  Result Date: 04/18/2020 CLINICAL DATA:  Brain tumor.  Worsening headaches EXAM: CT HEAD WITHOUT CONTRAST TECHNIQUE: Contiguous axial images were obtained from the base of the skull through the vertex without intravenous contrast. COMPARISON:  MRI head 04/14/2020.  CT head 04/13/2020 FINDINGS: Brain: Large hypodense mass in the right temporoparietal lobe is unchanged. Surrounding low-density edema also unchanged. 4 mm midline shift to the left slightly improved likely due to steroid treatment. There is mass-effect on the right lateral ventricle which is compressed. The mass abuts choroid calcification in the atrium of the right lateral ventricle. There is trapping of the right temporal horn which is unchanged. Pericallosal lipoma noted incidentally. No acute hemorrhage or infarction. Vascular: Negative for hyperdense vessel Skull:  No focal skeletal lesion. Sinuses/Orbits:Negative Other: None IMPRESSION: Large mass in the right temporoparietal lobe is unchanged. Surrounding edema unchanged. There is decreased mass-effect and midline shift which is now 4 mm. Electronically Signed   By: Franchot Gallo M.D.   On: 04/18/2020 09:34   CT Head Wo Contrast  Result Date: 04/13/2020 CLINICAL DATA:  Headache EXAM: CT HEAD WITHOUT CONTRAST  TECHNIQUE: Contiguous axial images were obtained from the base of the skull through the vertex without intravenous contrast. COMPARISON:  MRI 02/20/2012 FINDINGS: Brain: No  acute intracranial hemorrhage. Large hypodense mass with peripheral soft tissue density within the right temporoparietal region, measures approximately 5.2 x 4.7 x 3.7 cm. Surrounding hypodense edema within the white matter of the right temporal and parietal lobes with involvement of right basal ganglia and thalamus. Compression of the right lateral ventricle with possible mild trapped appearance of the right temporal horn. Approximately 6 mm midline shift to the left. Generalized sulcal edema on the right consistent with brain swelling. Vascular: No hyperdense vessels. Scattered carotid vascular calcification Skull: Normal. Negative for fracture or focal lesion. Sinuses/Orbits: No acute finding. Other: None IMPRESSION: 1. Large mass within the right temporoparietal region with significant surrounding edema and localized mass effect, including compression of right lateral ventricle and about 6 mm midline shift to the left. Generalized sulcal effacement in the right hemisphere consistent with edema. Mild asymmetric enlargement of the right temporal horn of the lateral ventricle. Critical Value/emergent results were called by telephone at the time of interpretation on 04/13/2020 at 9:15 pm to provider [facility] , who verbally acknowledged these results. Electronically Signed   By: Donavan Foil M.D.   On: 04/13/2020 21:15   MR BRAIN W WO CONTRAST  Result Date: 04/20/2020 CLINICAL DATA:  Right temporal craniotomy for resection of tumor. EXAM: MRI HEAD WITHOUT AND WITH CONTRAST TECHNIQUE: Multiplanar, multiecho pulse sequences of the brain and surrounding structures were obtained without and with intravenous contrast. CONTRAST:  7.58m GADAVIST GADOBUTROL 1 MMOL/ML IV SOLN COMPARISON:  MR head without and with contrast 04/18/2020. FINDINGS:  Brain: The patient is status post right temporoparietal craniotomy for resection of tumor. There is significant debulking of the tumor. Some residual enhancement is noted along the medial and anterior inferior aspects of the resection cavity. T1 hyperintense blood products are noted posteriorly within the surgical cavity. Additional mixed intensity blood products are as expected. Surrounding edematous changes in the anterior right temporal tip and internal capsule bilaterally are stable. Dural enhancement is present along the surgical margin and extending anteriorly to the temporal lobe. No distal metastatic lesions are present Residual midline shift measures 7 mm. Restricted diffusion is associated with the resection cavity and blood products. Restricted diffusion is noted in the peripheral areas of residual enhancement. Vascular: Flow is present in the major intracranial arteries. Skull and upper cervical spine: The craniocervical junction is normal. Upper cervical spine is within normal limits. Marrow signal is unremarkable. Sinuses/Orbits: The paranasal sinuses and mastoid air cells are clear. The globes and orbits are within normal limits. IMPRESSION: 1. Status post right temporoparietal craniotomy for resection of tumor. 2. Residual enhancement along the medial and anterior inferior aspects of the resection cavity. This may represent residual tumor. 3. Stable vasogenic edematous changes in the anterior right temporal tip and internal capsule bilaterally. 4. Residual midline shift measures 7 mm. 5. No distal lesions are present. Electronically Signed   By: CSan MorelleM.D.   On: 04/20/2020 07:50   MR BRAIN W WO CONTRAST  Result Date: 04/18/2020 CLINICAL DATA:  Brain tumor. Brain lab protocol. Additional history provided: Severe headaches. EXAM: MRI HEAD WITHOUT AND WITH CONTRAST TECHNIQUE: Multiplanar, multiecho pulse sequences of the brain and surrounding structures were obtained without and with  intravenous contrast. CONTRAST:  7.626mGADAVIST GADOBUTROL 1 MMOL/ML IV SOLN COMPARISON:  Noncontrast head CT 04/18/2020, brain MRI 04/14/2020 FINDINGS: Brain: Again demonstrated is a heterogeneous predominantly T2 hyperintense mass centered within the right periatrial region. The mass measures slightly smaller as compared to prior MRI 04/14/2020, 6.2 x  4.5 x 6.0 cm on the current examination (previously measured at 6.6 x 4.9 x 6.0 cm). As before, the mass demonstrates predominantly peripheral enhancement. Redemonstrated internal regions of restricted diffusion and SWI signal loss consistent with necrosis. Redemonstrated polypoid extension of the mass laterally, possibly contacting the adjacent dura. Similar appearance of surrounding T2/FLAIR parenchymal hyperintensity compatible with vasogenic edema and/or nonenhancing infiltrative tumor. Persistent although decreased mass effect with persistent partial effacement of the right lateral ventricle. Unchanged focal dilation of the right temporal horn without definite transependymal flow of CSF. As before, ependymal extension is difficult to exclude given mass effect upon the right lateral ventricle and adjacent enhancing choroid plexus. There is no acute infarct. No extra-axial fluid collection. Redemonstrated small pericallosal lipoma. Vascular: Expected proximal arterial flow voids. Skull and upper cervical spine: No focal marrow lesion. Sinuses/Orbits: Visualized orbits show no acute finding. Mild scattered paranasal sinus mucosal thickening, most notably within the ethmoid air cells. No significant mastoid effusion. IMPRESSION: A 6.2 x 4.5 x 6.0 cm mass centered within the right periatrial region measures similar to slightly smaller as compared to the MRI of 04/14/2020. Please see detailed description within the findings section above. Findings are highly concerning for high-grade glioma/GBM. Similar appearance of surrounding T2/FLAIR hyperintensity compatible with  vasogenic edema and/or nonenhancing infiltrative tumor. Persistent although decreased mass effect with partial effacement of the right lateral ventricle and now 4 mm leftward midline shift. Unchanged dilation of the right temporal horn without transependymal flow of CSF. Electronically Signed   By: Kellie Simmering DO   On: 04/18/2020 14:09   MR Brain W and Wo Contrast  Result Date: 04/14/2020 CLINICAL DATA:  Initial evaluation for brain mass, headache. 6.6 x 4.9 x 6.0 EXAM: MRI HEAD WITHOUT AND WITH CONTRAST TECHNIQUE: Multiplanar, multiecho pulse sequences of the brain and surrounding structures were obtained without and with intravenous contrast. CONTRAST:  7.87m GADAVIST GADOBUTROL 1 MMOL/ML IV SOLN COMPARISON:  Prior CT from 04/13/2020. FINDINGS: Brain: Cerebral volume within normal limits for age. No significant cerebral white matter disease. Heterogeneous predominant T2 hyperintense mass centered at the right periatrial region is seen. Lesion measures 6.6 x 4.9 x 6.0 cm in greatest dimensions (AP by transverse by craniocaudad). Lesion demonstrates heterogeneous T2 hyperintense signal intensity with avid irregular in predominant peripheral postcontrast enhancement. Scattered areas of internal restricted diffusion and susceptibility artifact consistent with necrosis. Polypoid extension of the lesion towards the overlying dura seen at the lateral margin of the lesion (series 12, image 24). Surrounding T2/FLAIR hyperintensity compatible with vasogenic edema and/or infiltrating nonenhancing tumor. Mass effect on the adjacent right lateral ventricle which is partially effaced and compressed. No definite ependymal extension, although difficult to be certain given mass effect an adjacent enhancing choroid plexus. Associated right-to-left midline shift measures up to 8 mm. Focal dilatation of the temporal horn of the right lateral ventricle without transependymal flow of CSF. No hydrocephalus. No other abnormal  enhancement within the brain. Small. Callosum lipoma noted. No other discrete mass lesion or mass effect. No evidence for acute or subacute infarct. Gray-white matter differentiation maintained elsewhere throughout the brain. No other areas of chronic infarction. No other foci of susceptibility artifact to suggest acute or chronic intracranial hemorrhage. Pituitary gland within normal limits. Vascular: Major intracranial vascular flow voids are maintained. Skull and upper cervical spine: Craniocervical junction within normal limits. Upper cervical spine normal. Bone marrow signal intensity within normal limits. No scalp soft tissue abnormality. Sinuses/Orbits: Globes and orbital soft tissues within normal limits. Paranasal  sinuses are largely clear. No significant mastoid effusion. Inner ear structures grossly normal. Other: None. IMPRESSION: 1. 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 Surrounding T2/FLAIR hyperintensity compatible with vasogenic edema and/or infiltrating nonenhancing tumor. Associated regional mass effect with 8 mm of right-to-left midline shift. Dilatation of the right temporal horn without transependymal flow of CSF. 2. Otherwise unremarkable brain MRI. No other acute intracranial abnormality identified. Electronically Signed   By: Jeannine Boga M.D.   On: 04/14/2020 00:52    Pathology: SURGICAL PATHOLOGY  CASE: [id number]  PATIENT: Emiliano Dyer  Surgical Pathology Report   Clinical History: brain tumor (cm)   FINAL MICROSCOPIC DIAGNOSIS:   A. BRAIN, RIGHT TEMPORAL PARIETAL MASS, EXCISION:  - Glioblastoma, WHO Grade IV  - See comment   COMMENT:   Dr. Saralyn Pilar reviewed the case and agrees with the above diagnosis.  IDH1/2, MGMT, and EGFR testing is pending and will be reported in an  addendum.    INTRAOPERATIVE DIAGNOSIS:   A. Right temporal/parietal mass: "Lesional tissue obtained."    Intraoperative diagnosis rendered by Dr. Jeannie Done at 1:05 PM on  04/19/2020.   GROSS DESCRIPTION:   Received fresh for rapid intraoperative consult are portions of  tan-white to hemorrhagic tissue measuring 5.8 x 5.2 x 2.7 cm in  aggregate. Sections are submitted for frozen section in 1 block.  Sections are submitted in 10 cassettes.  1 = tissue submitted for frozen section  2-10 = additional representative sections (GRP 04/19/2020)    Assessment/Plan Glioblastoma ([place]) [C71.9]  We appreciate the opportunity to participate in the care of JONATHAN KIRKENDOLL.  She presents today with clinical and radiographic syndrome consistent with right temporal glioblastoma.  Post operative MRI demonstrated only modest focus of residual enhancement in medial right temporal region.  Tumor markers such as IDH-1 and MGMT are still pending.    Today we extensively reviewed her clinical course, pathology, and imaging.  We counseled her regarding prognosis, expected and available treatment pathways, and goals of care.    We ultimately recommended proceeding with course of intensity modulated radiation therapy and concurrent daily Temozolomide.  Radiation will be administered Mon-Fri over 6 weeks, Temodar will be dosed at 73m/m2 to be given daily over 42 days.  We reviewed side effects of temodar, including fatigue, nausea/vomiting, constipation, and cytopenias.  Chemotherapy should be held for the following:  ANC less than 1,000  Platelets less than 100,000  LFT or creatinine greater than 2x ULN  If clinical concerns/contraindications develop  Every 2 weeks during radiation, labs will be checked accompanied by a clinical evaluation in the brain tumor clinic.  We recommended discontinuing Keppra once 14 days post-op.  Decadron should decrease to 478mdaily x4 days, then 8m74maily x4 days, then 1mg45mily x4 days, then STOP.  Referral will be placed for consultation and CT-simulation with radiation  oncology.  Screening for potential clinical trials was performed and discussed using eligibility criteria for active protocols at [facility], as well as national database available on [email or web address] The patient is a candidate and is interested in pursuing a research protocol.  Once we hear from her, referral will be placed to Dr. [facility] system-wide manager for clinical research [facility] also discussed our Ramipril study and she will consider this as well.  We spent  twenty additional minutes teaching regarding the natural history, biology, and historical experience in the treatment of brain tumors. We then discussed in detail the current recommendations for therapy focusing on the mode of administration, mechanism of action, anticipated toxicities, and quality of life issues associated with this plan. We also provided teaching sheets for the patient to take home as an additional resource.  All questions were answered. The patient knows to call the clinic with any problems, questions or concerns. No barriers to learning were detected.  The total time spent in the encounter was 60 minutes and more than 50% was on counseling and review of test results   Ventura Sellers, MD Medical Director of Neuro-Oncology [facility] [place] 04/26/20 3:03 PM

## 2020-04-27 ENCOUNTER — Telehealth: Payer: Self-pay | Admitting: Internal Medicine

## 2020-04-27 NOTE — Telephone Encounter (Signed)
Scheduled per 7/1 los. Unable to reach pt. Left voicemail with appt times and dates.

## 2020-05-01 ENCOUNTER — Encounter: Payer: Self-pay | Admitting: Internal Medicine

## 2020-05-02 NOTE — Progress Notes (Signed)
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  MRI Brain 04/20/2020: Status post right temporoparietal craniotomy for resection of tumor.  Residual enhancement along the medial and anterior inferior aspects of the resection cavity. This may represent residual tumor.  Stable vasogenic edematous changes in the anterior right temporal tip and internal capsule bilaterally.  Residual midline shift measures 7 mm.  MRI Brain 04/18/2020: A 6.2 x 4.5 x 6.0 cm mass centered within the right periatrial region measures similar to slightly smaller as compared to the MRI of 04/14/2020. Please see detailed description within the findings section above. Findings are highly concerning for high-grade glioma/GBM. Similar appearance of surrounding T2/FLAIR hyperintensity compatible with vasogenic edema and/or nonenhancing infiltrative tumor. Persistent although decreased mass effect with partial effacement of the right lateral ventricle and now 4 mm leftward midline shift.  CT Head 04/18/2020: Large mass in the right temporoparietal lobe is unchanged. Surrounding edema unchanged. There is decreased mass-effect and midline shift which is now 4 mm  MRI Brain 04/13/2020: 6.6 x 4.9 x 6.0 cm mass centered at the right periatrial region, highly concerning for high-grade glioma/GBM. Surrounding T2/FLAIR hyperintensity compatible with vasogenic edema and/or infiltrating nonenhancing tumor. Associated regional mass effect with 8 mm of right-to-left midline shift.  CT Head 04/13/2020: Large mass within the right temporoparietal region with significant surrounding edema and localized mass effect, including compression of right lateral ventricle and about 6 mm midline shift to the left. Generalized sulcal effacement in the right hemisphere consistent with edema.  Past or anticipated interventions, if any, per neurosurgery:   Dr. Christella Noa -Right temporal craniotomy 04/19/2020   Past or anticipated interventions, if any, per medical oncology:  Dr. Mickeal Skinner 04/26/2020 -We ultimately recommended proceeding with course of intensity modulated radiation therapy and concurrent daily Temozolomide. -Radiation will be administered Mon-Fri over 6 weeks, Temodar will be dosed at 1m/m2 to be given daily over 42 days. -We recommended discontinuing Keppra once 14 days post-op.  Decadron should decrease to 436mdaily x4 days, then 15m55maily x4 days, then 1mg75mily x4 days, then STOP. -Referral will be placed for consultation and CT-simulation with radiation oncology.  -Screening for potential clinical trials was performed and discussed using eligibility criteria for active protocols at [facility], as well as national database available on [email or web address] -The patient is a candidate and is interested in pursuing a research protocol.  Once we hear from her, referral will be placed to [facility] manager for clinical [facility] Dose of Decadron, if applicable: She is currently on a taper, current dose is 2 mg daily.  Will taper to   Recent neurologic symptoms, if any:   Seizures: No  Headaches: Has had some headaches since surgery, infrequent in nature, 5-6/10 on pain scale, takes tylenol with relief.  Nausea: No  Dizziness/ataxia: No  Difficulty with hand coordination: No  Focal numbness/weakness: No  Visual deficits/changes: "Hole in vision" on left side.  Has some vision loss in her left eye lower field.  Confusion/Memory deficits: No  Noted taste is off, appetite remains good.   SAFETY ISSUES:  Prior radiation? No   Pacemaker/ICD? No  Possible current pregnancy? Postmenopausal  Is the patient on methotrexate? No  Additional Complaints / other  details:

## 2020-05-03 ENCOUNTER — Encounter (HOSPITAL_COMMUNITY): Payer: Self-pay

## 2020-05-03 ENCOUNTER — Other Ambulatory Visit: Payer: Self-pay

## 2020-05-03 ENCOUNTER — Encounter: Payer: Self-pay | Admitting: Radiation Oncology

## 2020-05-03 ENCOUNTER — Ambulatory Visit
Admission: RE | Admit: 2020-05-03 | Discharge: 2020-05-03 | Disposition: A | Discharge status: Home / Self Care | Payer: 59 | Source: Ambulatory Visit | Attending: Radiation Oncology | Admitting: Radiation Oncology

## 2020-05-03 VITALS — Ht 66.0 in | Wt 174.0 lb

## 2020-05-03 DIAGNOSIS — C712 Malignant neoplasm of temporal lobe: Secondary | ICD-10-CM

## 2020-05-03 DIAGNOSIS — C719 Malignant neoplasm of brain, unspecified: Secondary | ICD-10-CM

## 2020-05-04 ENCOUNTER — Telehealth: Payer: Self-pay | Admitting: *Deleted

## 2020-05-04 NOTE — Telephone Encounter (Signed)
[place] Psychosocial Distress Screening Clinical Social Work  Clinical Social Work was referred by distress screening protocol.  The patient scored a 5 on the Psychosocial Distress Thermometer which indicates moderate distress. Clinical Social Worker contacted patient by phone to assess for distress and other psychosocial needs. Mrs. Ace shared her treatment journey to this point and plan for continued treatment.  The patient expressed a large feeling of support from both friends/family and her healthcare team.  She discussed her blessings and acknowledged "hard days".  CSW validated patient's feelings and encouraged her to follow up as needed.  The patient shared her love for walking and staying active- she hopes to continue to walk through treatment.  CSW provided information on [facility] to Recovery program for the cancer community as well as other supportive programs.  ONCBCN DISTRESS SCREENING 05/03/2020  Screening Type Initial Screening  Distress experienced in past week (1-10) 5  Other Contact via phone    Clinical Social Worker follow up needed: Yes  If yes, follow up plan:  CSW will mail information on support programs to patient's home.  Patient plans to follow up with CSW as needed.  Gwinda Maine, LCSW

## 2020-05-06 NOTE — Progress Notes (Signed)
Radiation Oncology         [phone] ________________________________  Name: Terry Bell MRN: [id number]  Date: 05/03/2020  DOB: July 27, 1956  WU:[id number], Malka So, MD  Mickeal Skinner Acey Lav, MD     REFERRING PHYSICIAN: Ventura Sellers, MD   DIAGNOSIS: The primary encounter diagnosis was Glioblastoma [facility]). A diagnosis of Glioblastoma multiforme of temporal lobe ([place]) was also pertinent to this visit.   HISTORY OF PRESENT ILLNESS::Terry Bell is a 64 y.o. female who is seen for an initial consultation visit regarding the patient's diagnosis of glioblastoma multiform.  The patient has had recent complaints of headache and possible change in taste.  CNS imaging revealed a large right temporal mass.  MRI demonstrated a 6.6 cm tumor in this location with no other intracranial lesions.  The imaging was consistent with a GBM and the patient proceeded with resection on 04/19/2020.  This appeared to go well.  Postoperative MRI scan revealed modest enhancement medially within the right temporal region in the setting of substantial resection of the tumor.  The patient has been recovering well since surgery and I have been asked to see the patient for consideration of possible adjuvant radiation treatment.  The patient has also been seen by neuro oncology.    PREVIOUS RADIATION THERAPY: No   PAST MEDICAL HISTORY:  has a past medical history of Arthritis, Chronic nonalcoholic liver disease, Complication of anesthesia, Continuous leakage of urine, Depression, Diverticulosis, OSA (obstructive sleep apnea) (07/22/2016), PONV (postoperative nausea and vomiting), Steatohepatitis, Tinnitus, and Varicose veins.     PAST SURGICAL HISTORY: Past Surgical History:  Procedure Laterality Date  . APPLICATION OF CRANIAL NAVIGATION N/A 04/19/2020   Procedure: APPLICATION OF CRANIAL NAVIGATION;  Surgeon: Ashok Pall, MD;  Location: [place];  Service: Neurosurgery;  Laterality: N/A;  . arthroscopic left knee sugery      . bilateral bunionectomy    . BREAST BIOPSY Left   . CHOLECYSTECTOMY    . colonsocopy    . CRANIOTOMY Right 04/19/2020   Procedure: Right Temporal craniotomy for tumor resection;  Surgeon: Ashok Pall, MD;  Location: [place];  Service: Neurosurgery;  Laterality: Right;  . debridement and removal and lateral release of torn cartlidge in rt knee    . gum grating    . rt knee arthroscopy    . torn cartledge in rt knee       FAMILY HISTORY: family history includes Cancer in her father and another family member; Diabetes in her mother and sister; Hemochromatosis in an other family member; Hypertension in her brother and another family member; Liver disease in her mother and sister.   SOCIAL HISTORY:  reports that she has never smoked. She has never used smokeless tobacco. She reports that she does not drink alcohol and does not use drugs.   ALLERGIES: Niacin   MEDICATIONS:  Current Outpatient Medications  Medication Sig Dispense Refill  . cetirizine (ZYRTEC) 10 MG tablet Take 10 mg by mouth daily as needed for allergies.    Marland Kitchen dexamethasone (DECADRON) 4 MG tablet Take 1 tablet (4 mg total) by mouth 2 (two) times daily. 30 tablet 2  . Cholecalciferol (VITAMIN D) 1000 UNITS capsule Take 1,000 Units by mouth 2 (two) times daily.  (Patient not taking: Reported on 05/03/2020)    . levETIRAcetam (KEPPRA) 500 MG tablet Take 1 tablet (500 mg total) by mouth 2 (two) times daily. (Patient not taking: Reported on 05/03/2020) 60 tablet 3  . Omega-3 Fatty Acids (FISH OIL) 1000  MG CAPS Take 1,000 mg by mouth daily. (Patient not taking: Reported on 05/03/2020)    . ondansetron (ZOFRAN) 4 MG tablet Take 1 tablet (4 mg total) by mouth every 8 (eight) hours as needed for nausea or vomiting. (Patient not taking: Reported on 04/26/2020) 30 tablet 1  . pantoprazole (PROTONIX) 40 MG tablet Take 1 tablet (40 mg total) by mouth daily for 14 days. 14 tablet 0  . Probiotic Product (ALIGN) 4 MG CAPS Take 4 mg by mouth  daily. (Patient not taking: Reported on 05/03/2020)    . TURMERIC PO Take 538 mg by mouth daily. (Patient not taking: Reported on 05/03/2020)    . vitamin E 400 UNIT capsule Take 800 Units by mouth daily.  (Patient not taking: Reported on 05/03/2020)     No current facility-administered medications for this encounter.     REVIEW OF SYSTEMS:  A 15 point review of systems is documented in the electronic medical record. This was obtained by the nursing staff. However, I reviewed this with the patient to discuss relevant findings and make appropriate changes.  Pertinent items are noted in HPI.    PHYSICAL EXAM:  height is 5' 6"  (1.676 m) and weight is 174 lb (78.9 kg).   ECOG = 2  0 - Asymptomatic (Fully active, able to carry on all predisease activities without restriction)  1 - Symptomatic but completely ambulatory (Restricted in physically strenuous activity but ambulatory and able to carry out work of a light or sedentary nature. For example, light housework, office work)  2 - Symptomatic, <50% in bed during the day (Ambulatory and capable of all self care but unable to carry out any work activities. Up and about more than 50% of waking hours)  3 - Symptomatic, >50% in bed, but not bedbound (Capable of only limited self-care, confined to bed or chair 50% or more of waking hours)  4 - Bedbound (Completely disabled. Cannot carry on any self-care. Totally confined to bed or chair)  5 - Death   Eustace Pen MM, Creech RH, Tormey DC, et al. [phone]). "Toxicity and response criteria of the [facility]". [place] Oncol. 5 (6): 649-55  Alert and oriented x3, no acute distress   LABORATORY DATA:  Lab Results  Component Value Date   WBC 10.2 04/19/2020   HGB 14.8 04/19/2020   HCT 42.0 04/19/2020   MCV 89.0 04/19/2020   PLT 159 04/19/2020   Lab Results  Component Value Date   NA 135 04/21/2020   K 4.2 04/21/2020   CL 101 04/21/2020   CO2 21 (L) 04/21/2020   Lab Results   Component Value Date   ALT 47 (H) 04/17/2020   AST 26 04/17/2020   ALKPHOS 45 04/17/2020   BILITOT 1.3 (H) 04/17/2020      RADIOGRAPHY: CT HEAD WO CONTRAST  Result Date: 04/18/2020 CLINICAL DATA:  Brain tumor.  Worsening headaches EXAM: CT HEAD WITHOUT CONTRAST TECHNIQUE: Contiguous axial images were obtained from the base of the skull through the vertex without intravenous contrast. COMPARISON:  MRI head 04/14/2020.  CT head 04/13/2020 FINDINGS: Brain: Large hypodense mass in the right temporoparietal lobe is unchanged. Surrounding low-density edema also unchanged. 4 mm midline shift to the left slightly improved likely due to steroid treatment. There is mass-effect on the right lateral ventricle which is compressed. The mass abuts choroid calcification in the atrium of the right lateral ventricle. There is trapping of the right temporal horn which is unchanged. Pericallosal lipoma  noted incidentally. No acute hemorrhage or infarction. Vascular: Negative for hyperdense vessel Skull:  No focal skeletal lesion. Sinuses/Orbits:Negative Other: None IMPRESSION: Large mass in the right temporoparietal lobe is unchanged. Surrounding edema unchanged. There is decreased mass-effect and midline shift which is now 4 mm. Electronically Signed   By: Franchot Gallo M.D.   On: 04/18/2020 09:34   CT Head Wo Contrast  Result Date: 04/13/2020 CLINICAL DATA:  Headache EXAM: CT HEAD WITHOUT CONTRAST TECHNIQUE: Contiguous axial images were obtained from the base of the skull through the vertex without intravenous contrast. COMPARISON:  MRI 02/20/2012 FINDINGS: Brain: No acute intracranial hemorrhage. Large hypodense mass with peripheral soft tissue density within the right temporoparietal region, measures approximately 5.2 x 4.7 x 3.7 cm. Surrounding hypodense edema within the white matter of the right temporal and parietal lobes with involvement of right basal ganglia and thalamus. Compression of the right lateral  ventricle with possible mild trapped appearance of the right temporal horn. Approximately 6 mm midline shift to the left. Generalized sulcal edema on the right consistent with brain swelling. Vascular: No hyperdense vessels. Scattered carotid vascular calcification Skull: Normal. Negative for fracture or focal lesion. Sinuses/Orbits: No acute finding. Other: None IMPRESSION: 1. Large mass within the right temporoparietal region with significant surrounding edema and localized mass effect, including compression of right lateral ventricle and about 6 mm midline shift to the left. Generalized sulcal effacement in the right hemisphere consistent with edema. Mild asymmetric enlargement of the right temporal horn of the lateral ventricle. Critical Value/emergent results were called by telephone at the time of interpretation on 04/13/2020 at 9:15 pm to provider [facility] , who verbally acknowledged these results. Electronically Signed   By: Donavan Foil M.D.   On: 04/13/2020 21:15   MR BRAIN W WO CONTRAST  Result Date: 04/20/2020 CLINICAL DATA:  Right temporal craniotomy for resection of tumor. EXAM: MRI HEAD WITHOUT AND WITH CONTRAST TECHNIQUE: Multiplanar, multiecho pulse sequences of the brain and surrounding structures were obtained without and with intravenous contrast. CONTRAST:  7.83m GADAVIST GADOBUTROL 1 MMOL/ML IV SOLN COMPARISON:  MR head without and with contrast 04/18/2020. FINDINGS: Brain: The patient is status post right temporoparietal craniotomy for resection of tumor. There is significant debulking of the tumor. Some residual enhancement is noted along the medial and anterior inferior aspects of the resection cavity. T1 hyperintense blood products are noted posteriorly within the surgical cavity. Additional mixed intensity blood products are as expected. Surrounding edematous changes in the anterior right temporal tip and internal capsule bilaterally are stable. Dural enhancement is present  along the surgical margin and extending anteriorly to the temporal lobe. No distal metastatic lesions are present Residual midline shift measures 7 mm. Restricted diffusion is associated with the resection cavity and blood products. Restricted diffusion is noted in the peripheral areas of residual enhancement. Vascular: Flow is present in the major intracranial arteries. Skull and upper cervical spine: The craniocervical junction is normal. Upper cervical spine is within normal limits. Marrow signal is unremarkable. Sinuses/Orbits: The paranasal sinuses and mastoid air cells are clear. The globes and orbits are within normal limits. IMPRESSION: 1. Status post right temporoparietal craniotomy for resection of tumor. 2. Residual enhancement along the medial and anterior inferior aspects of the resection cavity. This may represent residual tumor. 3. Stable vasogenic edematous changes in the anterior right temporal tip and internal capsule bilaterally. 4. Residual midline shift measures 7 mm. 5. No distal lesions are present. Electronically Signed   By: CHarrell Gave  Mattern M.D.   On: 04/20/2020 07:50   MR BRAIN W WO CONTRAST  Result Date: 04/18/2020 CLINICAL DATA:  Brain tumor. Brain lab protocol. Additional history provided: Severe headaches. EXAM: MRI HEAD WITHOUT AND WITH CONTRAST TECHNIQUE: Multiplanar, multiecho pulse sequences of the brain and surrounding structures were obtained without and with intravenous contrast. CONTRAST:  7.23m GADAVIST GADOBUTROL 1 MMOL/ML IV SOLN COMPARISON:  Noncontrast head CT 04/18/2020, brain MRI 04/14/2020 FINDINGS: Brain: Again demonstrated is a heterogeneous predominantly T2 hyperintense mass centered within the right periatrial region. The mass measures slightly smaller as compared to prior MRI 04/14/2020, 6.2 x 4.5 x 6.0 cm on the current examination (previously measured at 6.6 x 4.9 x 6.0 cm). As before, the mass demonstrates predominantly peripheral enhancement.  Redemonstrated internal regions of restricted diffusion and SWI signal loss consistent with necrosis. Redemonstrated polypoid extension of the mass laterally, possibly contacting the adjacent dura. Similar appearance of surrounding T2/FLAIR parenchymal hyperintensity compatible with vasogenic edema and/or nonenhancing infiltrative tumor. Persistent although decreased mass effect with persistent partial effacement of the right lateral ventricle. Unchanged focal dilation of the right temporal horn without definite transependymal flow of CSF. As before, ependymal extension is difficult to exclude given mass effect upon the right lateral ventricle and adjacent enhancing choroid plexus. There is no acute infarct. No extra-axial fluid collection. Redemonstrated small pericallosal lipoma. Vascular: Expected proximal arterial flow voids. Skull and upper cervical spine: No focal marrow lesion. Sinuses/Orbits: Visualized orbits show no acute finding. Mild scattered paranasal sinus mucosal thickening, most notably within the ethmoid air cells. No significant mastoid effusion. IMPRESSION: A 6.2 x 4.5 x 6.0 cm mass centered within the right periatrial region measures similar to slightly smaller as compared to the MRI of 04/14/2020. Please see detailed description within the findings section above. Findings are highly concerning for high-grade glioma/GBM. Similar appearance of surrounding T2/FLAIR hyperintensity compatible with vasogenic edema and/or nonenhancing infiltrative tumor. Persistent although decreased mass effect with partial effacement of the right lateral ventricle and now 4 mm leftward midline shift. Unchanged dilation of the right temporal horn without transependymal flow of CSF. Electronically Signed   By: KKellie SimmeringDO   On: 04/18/2020 14:09   MR Brain W and Wo Contrast  Result Date: 04/14/2020 CLINICAL DATA:  Initial evaluation for brain mass, headache. 6.6 x 4.9 x 6.0 EXAM: MRI HEAD WITHOUT AND WITH  CONTRAST TECHNIQUE: Multiplanar, multiecho pulse sequences of the brain and surrounding structures were obtained without and with intravenous contrast. CONTRAST:  7.531mGADAVIST GADOBUTROL 1 MMOL/ML IV SOLN COMPARISON:  Prior CT from 04/13/2020. FINDINGS: Brain: Cerebral volume within normal limits for age. No significant cerebral white matter disease. Heterogeneous predominant T2 hyperintense mass centered at the right periatrial region is seen. Lesion measures 6.6 x 4.9 x 6.0 cm in greatest dimensions (AP by transverse by craniocaudad). Lesion demonstrates heterogeneous T2 hyperintense signal intensity with avid irregular in predominant peripheral postcontrast enhancement. Scattered areas of internal restricted diffusion and susceptibility artifact consistent with necrosis. Polypoid extension of the lesion towards the overlying dura seen at the lateral margin of the lesion (series 12, image 24). Surrounding T2/FLAIR hyperintensity compatible with vasogenic edema and/or infiltrating nonenhancing tumor. Mass effect on the adjacent right lateral ventricle which is partially effaced and compressed. No definite ependymal extension, although difficult to be certain given mass effect an adjacent enhancing choroid plexus. Associated right-to-left midline shift measures up to 8 mm. Focal dilatation of the temporal horn of the right lateral ventricle without transependymal flow of CSF.  No hydrocephalus. No other abnormal enhancement within the brain. Small. Callosum lipoma noted. No other discrete mass lesion or mass effect. No evidence for acute or subacute infarct. Gray-white matter differentiation maintained elsewhere throughout the brain. No other areas of chronic infarction. No other foci of susceptibility artifact to suggest acute or chronic intracranial hemorrhage. Pituitary gland within normal limits. Vascular: Major intracranial vascular flow voids are maintained. Skull and upper cervical spine: Craniocervical  junction within normal limits. Upper cervical spine normal. Bone marrow signal intensity within normal limits. No scalp soft tissue abnormality. Sinuses/Orbits: Globes and orbital soft tissues within normal limits. Paranasal sinuses are largely clear. No significant mastoid effusion. Inner ear structures grossly normal. Other: None. IMPRESSION: 1. 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 Surrounding T2/FLAIR hyperintensity compatible with vasogenic edema and/or infiltrating nonenhancing tumor. Associated regional mass effect with 8 mm of right-to-left midline shift. Dilatation of the right temporal horn without transependymal flow of CSF. 2. Otherwise unremarkable brain MRI. No other acute intracranial abnormality identified. Electronically Signed   By: Jeannine Boga M.D.   On: 04/14/2020 00:52       IMPRESSION/ PLAN:  The patient is status post resection of a glioblastoma multiform, WHO grade IV,  which initially measured 6.6 cm, present within the right temporal lobe.  The patient is a good candidate for adjuvant chemoradiation treatment for 6 weeks.  She has been recovering well and surgery went well with some modest enhancement along the medial aspect of the surgical cavity.  I discussed the rationale of radiation treatment in this setting, including the pros and cons.  We also discussed coordination with chemotherapy during this time through neuro oncology.  All of her questions were answered and she does wish to proceed with this overall treatment plan in the next several weeks.  She has been scheduled for a simulation on 05/15/2020.   Due to the coronavirus pandemic, this encounter was provided by telemedicine platform MyChart.  The patient has given verbal consent for this type of encounter and has been advised to only accept a meeting of this type in a secure network environment. The time spent during this encounter was 45  minutes today including review of medical records, discussion with patient, and coordination of care. The attendants for this meeting include  Dr. Lisbeth Renshaw and the patient and her daughter.  During the encounter,  Dr. Lisbeth Renshaw, and was located at [facility] Radiation Oncology Department.  The patient was located at home.       ________________________________   Jodelle Gross, MD, PhD   **Disclaimer: This note was dictated with voice recognition software. Similar sounding words can inadvertently be transcribed and this note may contain transcription errors which may not have been corrected upon publication of note.**

## 2020-05-09 ENCOUNTER — Telehealth: Payer: Self-pay | Admitting: Internal Medicine

## 2020-05-09 DIAGNOSIS — C718 Malignant neoplasm of overlapping sites of brain: Secondary | ICD-10-CM | POA: Diagnosis not present

## 2020-05-09 DIAGNOSIS — C713 Malignant neoplasm of parietal lobe: Secondary | ICD-10-CM | POA: Diagnosis not present

## 2020-05-09 DIAGNOSIS — K7581 Nonalcoholic steatohepatitis (NASH): Secondary | ICD-10-CM | POA: Diagnosis not present

## 2020-05-09 NOTE — Telephone Encounter (Signed)
[id number] Faxed medical records to [facility] @ [phone]

## 2020-05-14 ENCOUNTER — Encounter (HOSPITAL_COMMUNITY): Payer: Self-pay

## 2020-05-14 LAB — SURGICAL PATHOLOGY

## 2020-05-15 ENCOUNTER — Ambulatory Visit
Admission: RE | Admit: 2020-05-15 | Discharge: 2020-05-15 | Disposition: A | Discharge status: Home / Self Care | Payer: 59 | Source: Ambulatory Visit | Attending: Radiation Oncology | Admitting: Radiation Oncology

## 2020-05-15 ENCOUNTER — Other Ambulatory Visit: Payer: Self-pay

## 2020-05-15 DIAGNOSIS — C712 Malignant neoplasm of temporal lobe: Secondary | ICD-10-CM | POA: Insufficient documentation

## 2020-05-15 DIAGNOSIS — Z510 Encounter for antineoplastic radiation therapy: Secondary | ICD-10-CM | POA: Diagnosis not present

## 2020-05-15 MED ORDER — SODIUM CHLORIDE 0.9% FLUSH
10.0000 mL | Freq: Once | INTRAVENOUS | Status: AC
  Administered 2020-05-15: 10 mL via INTRAVENOUS

## 2020-05-15 NOTE — Addendum Note (Signed)
Encounter addended by: Cori Razor, RN on: 05/15/2020 11:16 AM  Actions taken: MAR administration accepted, LDA properties accepted, Flowsheet accepted

## 2020-05-15 NOTE — Progress Notes (Addendum)
Has armband been applied?  Yes  Does patient have an allergy to IV contrast dye?: No   Has patient ever received premedication for IV contrast dye?: n/a  Does patient take metformin?: No  If patient does take metformin when was the last dose: n/a  Date of lab work: 04/21/2020 BUN: 13 CR: 0.74 Egfr: >60  IV site: Right AC  Has IV site been added to flowsheet?  Yes

## 2020-05-17 ENCOUNTER — Other Ambulatory Visit: Payer: Self-pay | Admitting: Internal Medicine

## 2020-05-17 DIAGNOSIS — C712 Malignant neoplasm of temporal lobe: Secondary | ICD-10-CM

## 2020-05-17 MED ORDER — ONDANSETRON HCL 8 MG PO TABS: 8.0000 mg | ORAL_TABLET | Freq: Two times a day (BID) | ORAL | 1 refills | Status: DC | PRN

## 2020-05-17 MED ORDER — TEMOZOLOMIDE 140 MG PO CAPS: 140.0000 mg | ORAL_CAPSULE | Freq: Every day | ORAL | 0 refills | Status: DC

## 2020-05-17 MED FILL — ONDANSETRON HCL 8 MG TABLET: 8 | 15 days supply | Qty: 30 | Fill #0

## 2020-05-17 NOTE — Progress Notes (Signed)
START ON PATHWAY REGIMEN - Neuro     One cycle, concurrent with RT:     Temozolomide   **Always confirm dose/schedule in your pharmacy ordering system**  Patient Characteristics: Glioblastoma (Grade IV Glioma), Newly Diagnosed / Treatment Naive, Good Performance Status and/or Younger Patient, MGMT Promoter Unmethylated/Unknown Disease Classification: Glioma Disease Classification: Glioblastoma (Grade IV Glioma) Disease Status: Newly Diagnosed / Treatment Naive Performance Status: Good Performance Status and/or Younger Patient MGMT Promoter Methylation Status: Awaiting Test Results Intent of Therapy: Non-Curative / Palliative Intent, Discussed with Patient

## 2020-05-18 ENCOUNTER — Telehealth: Payer: Self-pay

## 2020-05-18 NOTE — Telephone Encounter (Signed)
Oral Oncology Patient Advocate Encounter  Received notification from [facility] that prior authorization for Temozolomide is required.  PA submitted on CoverMyMeds Key [id number] Status is pending  Oral Oncology Clinic will continue to follow.  [place] Patient [place] Phone [phone] Fax [phone] 05/18/2020 8:15 AM

## 2020-05-21 ENCOUNTER — Telehealth: Payer: Self-pay | Admitting: Pharmacist

## 2020-05-21 NOTE — Telephone Encounter (Signed)
Oral Oncology Patient Advocate Encounter  Prior Authorization for Temodar has been approved.    PA# [id number] Effective dates: 05/21/20 through 05/21/21  Patients co-pay is $0  Oral Oncology Clinic will continue to follow.    [place] Patient [place] Phone [phone] Fax [phone] 05/21/2020 10:00 AM

## 2020-05-21 NOTE — Telephone Encounter (Signed)
Oral Oncology Pharmacist Encounter  Received new prescription for Temodar (temozolomide) for the treatment of GBM in conjunction with XRT, planned duration until disease progression or unacceptable drug toxicity. Planned start 05/28/20.  Prescription dose and frequency assessed.   Current medication list in Epic reviewed, no DDIs with temozolomide identified.  Prescription has been e-scribed to the [facility] for benefits analysis and approval.  Oral Oncology Clinic will continue to follow for insurance authorization, copayment issues, initial counseling and start date.  Darl Pikes, PharmD, BCPS, BCOP, CPP Hematology/Oncology Clinical Pharmacist Practitioner [facility] [phone]  05/21/2020 10:02 AM

## 2020-05-22 ENCOUNTER — Encounter: Payer: Self-pay | Admitting: Internal Medicine

## 2020-05-22 NOTE — Telephone Encounter (Addendum)
Oral Chemotherapy Pharmacist Encounter Temodar will be shipped to patient on 05/23/2020 for delivery on 05/24/2020. Patient will start Temodar on 05/28/2020, 1 hour prior to radiation per [facility] recommendations.  I spoke with patient for overview of: Temodar (temozolomide) for the treatment of glioblastoma multiforme in conjunction with radiation, planned duration concomitant phase 42 days of therapy.  Patient will likely continue on Temodar for maintenance treatment for 6-12 cycles after completion of concomitant phase.  Counseled patient on administration, dosing, side effects, monitoring, drug-food interactions, safe handling, storage, and disposal.  Patient will take Temodar 161m capsules, 140 mg total daily dose, by mouth once daily, may take at bedtime and on an empty stomach to decrease nausea and vomiting.  Patient will take Temodar concurrent with radiation for 42 days straight.  Temodar and radiation start date: 05/28/2020   Patient will take Zofran 851mtablet, 1 tablet by mouth 30-60 min prior to Temodar dose to help decrease N/V once starting adjuvant therapy. Prophylactic Zofran will not be used at initiation of concurrent phase, but will be initiated if nausea develops despite Temodar administration on an empty stomach and at bedtime.   Adverse effects include but are not limited to: nausea, vomiting, anorexia, GI upset, rash, drug fever, and fatigue. Rare but serious adverse effects of pneumocystis pneumonia and secondary malignancy also discussed.  PCP prophylaxis will not be initiated at this time, but may be added based on lymphocyte count in the future.  Reviewed with patient importance of keeping a medication schedule and plan for any missed doses.  Medication reconciliation performed and medication/allergy list updated.  This will ship from the [facility] 05/23/2020 to deliver to patient's home on 05/24/2020.  Patient informed the pharmacy will reach  out 5-7 days prior to needing next fill of Temodar to coordinate continued medication acquisition to prevent break in therapy.   All questions answered.  Ms. OwKinzieoiced understanding and appreciation.   Patient knows to call the office with questions or concerns.  ReLeron CroakPharmD, BCPS Hematology/Oncology Clinical Pharmacist [phone]/27/2021 11:20 AM

## 2020-05-23 MED FILL — TEMOZOLOMIDE 140 MG CAPS: 140 | 28 days supply | Qty: 28 | Fill #0

## 2020-05-24 DIAGNOSIS — C712 Malignant neoplasm of temporal lobe: Secondary | ICD-10-CM | POA: Diagnosis not present

## 2020-05-24 DIAGNOSIS — Z510 Encounter for antineoplastic radiation therapy: Secondary | ICD-10-CM | POA: Diagnosis not present

## 2020-05-25 ENCOUNTER — Other Ambulatory Visit: Payer: Self-pay

## 2020-05-25 ENCOUNTER — Ambulatory Visit: Payer: 59 | Attending: Neurosurgery | Admitting: Physical Therapy

## 2020-05-25 DIAGNOSIS — R2681 Unsteadiness on feet: Secondary | ICD-10-CM | POA: Diagnosis not present

## 2020-05-25 DIAGNOSIS — Z9181 History of falling: Secondary | ICD-10-CM | POA: Diagnosis not present

## 2020-05-25 DIAGNOSIS — R209 Unspecified disturbances of skin sensation: Secondary | ICD-10-CM | POA: Insufficient documentation

## 2020-05-25 DIAGNOSIS — R29818 Other symptoms and signs involving the nervous system: Secondary | ICD-10-CM | POA: Diagnosis not present

## 2020-05-25 DIAGNOSIS — M6281 Muscle weakness (generalized): Secondary | ICD-10-CM | POA: Diagnosis not present

## 2020-05-25 DIAGNOSIS — R2689 Other abnormalities of gait and mobility: Secondary | ICD-10-CM | POA: Insufficient documentation

## 2020-05-25 DIAGNOSIS — R208 Other disturbances of skin sensation: Secondary | ICD-10-CM

## 2020-05-25 NOTE — Therapy (Addendum)
[facility] [address] [place], [place], [address] Phone: [phone]   Fax:  [phone]  Physical Therapy Evaluation  Patient Details  Name: Terry Bell MRN: [id number] Date of Birth: 09/13/1956 Referring Provider (PT): Ashok Pall, MD   Encounter Date: 05/25/2020   PT End of Session - 05/25/20 1351    Visit Number 1    Number of Visits 17    Date for PT Re-Evaluation 08/23/20   written for 60 day POC   Authorization Type [employer]    PT Start Time 1100    PT Stop Time 1145    PT Time Calculation (min) 45 min    Equipment Utilized During Treatment Gait belt    Activity Tolerance Patient tolerated treatment well    Behavior During Therapy [facility] for tasks assessed/performed   needing redirection at times          Past Medical History:  Diagnosis Date  . Arthritis   . Chronic nonalcoholic liver disease   . Complication of anesthesia   . Continuous leakage of urine   . Depression    situational  . Diverticulosis   . OSA (obstructive sleep apnea) 07/22/2016  . PONV (postoperative nausea and vomiting)   . Steatohepatitis   . Tinnitus   . Varicose veins     Past Surgical History:  Procedure Laterality Date  . APPLICATION OF CRANIAL NAVIGATION N/A 04/19/2020   Procedure: APPLICATION OF CRANIAL NAVIGATION;  Surgeon: Ashok Pall, MD;  Location: [place];  Service: Neurosurgery;  Laterality: N/A;  . arthroscopic left knee sugery    . bilateral bunionectomy    . BREAST BIOPSY Left   . CHOLECYSTECTOMY    . colonsocopy    . CRANIOTOMY Right 04/19/2020   Procedure: Right Temporal craniotomy for tumor resection;  Surgeon: Ashok Pall, MD;  Location: [facility];  Service: Neurosurgery;  Laterality: Right;  . debridement and removal and lateral release of torn cartlidge in rt knee    . gum grating    . rt knee arthroscopy    . torn cartledge in rt knee      There were no vitals filed for this visit.     Subjective Assessment - 05/25/20 1104    Subjective Pt hospitalized 04/13/20 and is s/p grade lV glioma resection from R temporal/parietal lobe. Was very active prior. Would walk 2 times per day - a couple miles a day (4-5 miles per day). Is being seen at [facility] for a clinical trial. Got leukophoresis done on the 28th at [facility] - felt really weak after that.  Fell 3 times yesterday - was trying to get up from a couch and hit the ground, 2 falls were in the bathroom - urine got on the floor and feet sleep from under her. Needed help from her husband to help stand up.  Starts radiation and chemo next week. Got a treadmill at home for walking. Has a tendency to bump into door frames on L (can't see in L lower visual field) - has learned to put L hand up when he is going through them. When walking on pavement, pt would use a walking stick in RUE (always has a walking buddy).    Patient is accompained by: Family member   husband Mitch   Pertinent History PMH includes arthritis, depression, OSA, R knee arthroscopy, urinary urgency    Patient Stated Goals wants to work on core and leg strength, wants to incr independence (go  to the bathroom)    Currently in Pain? No/denies              [facility] PT Assessment - 05/25/20 1118      Assessment   Medical Diagnosis s/p grade lV glioma resection from R temporal/parietal lobe.     Referring Provider (PT) Ashok Pall, MD    Onset Date/Surgical Date 04/13/20    Hand Dominance Right    Prior Therapy PT after R knee arthroscopy      Precautions   Precautions Fall    Precaution Comments not driving      Balance Screen   Has the patient fallen in the past 6 months Yes    How many times? 3   this past week   Has the patient had a decrease in activity level because of a fear of falling?  No    Is the patient reluctant to leave their home because of a fear of falling?  No      Home Social worker Private residence    Living Arrangements  Spouse/significant other;Non-relatives/Friends;Other (Comment)   pt's spouse 44 yr old dad under hospice   Available Help at Discharge Family;Friend(s)    Type of [place] to enter    Entrance Stairs-Number of Steps 4    Entrance Stairs-Rails Right;Cannot reach both    Home Layout Two level    Alternate Level Stairs-Number of Steps 12    Alternate Level Stairs-Rails Left    Home Equipment Bedside commode;Shower seat;Tub bench   working on getting grab bars   Additional Comments also has a walking stick for gait outdoors on pavement, has not been showering by herself      Prior Function   Level of Independence Independent    Vocation On disability    Engineer, 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    Leisure would love to get back to walking outdoors (4-5 miles a day)      Sensation   Light Touch Impaired by gross assessment;Impaired Detail    Light Touch Impaired Details Absent LLE    Proprioception Appears Intact    Additional Comments light touch sensation intact on RLE, unable to detect on LLE - when touching LLE pt at times will report she feels sensation on RLE      Coordination   Gross Motor Movements are Fluid and Coordinated Yes   however slightly slower on LLE     ROM / Strength   AROM / PROM / Strength Strength      Strength   Strength Assessment Site Hip;Knee;Ankle    Right/Left Hip Right;Left    Right Hip Flexion 4/5    Left Hip Flexion 4-/5    Right/Left Knee Right;Left    Right Knee Flexion 5/5    Right Knee Extension 5/5    Left Knee Flexion 4+/5    Left Knee Extension 4+/5    Right/Left Ankle Right;Left    Right Ankle Dorsiflexion 5/5    Left Ankle Dorsiflexion 4+/5      Transfers   Transfers Sit to Stand;Stand to Sit    Sit to Stand 5: Supervision    Five time sit to stand comments  20 seconds from standard height chair with no UE support    Stand to Sit 5: Supervision    Stand to Sit Details decr eccentric control at times        Ambulation/Gait   Ambulation/Gait Yes  Ambulation/Gait Assistance 5: Supervision;4: Min guard    Ambulation/Gait Assistance Details needing min guard at times on L due to loss of vision in L lower visual field (would get close to objects at times), pt holding hand L hand up when going through door ways. pt appears guarded with gait with decr arm swing     Ambulation Distance (Feet) --   clinic distances   Assistive device None    Gait Pattern Step-through pattern;Wide base of support;Decreased trunk rotation;Left foot flat;Right foot flat;Decreased arm swing - right;Decreased arm swing - left;Decreased weight shift to left    Ambulation Surface Level;Indoor    Gait velocity 13.28 seconds = 2.46 ft/sec    Stairs Yes    Stairs Assistance 5: Supervision    Stairs Assistance Details (indicate cue type and reason) performs first descending first with RLE, then performed with descending first with LLE (after cues to descend with weaker leg - pt reports feeling more stable performing this way)    Stair Management Technique One rail Left;Step to pattern;Forwards    Number of Stairs 8    Height of Stairs 6      Standardized Balance Assessment   Standardized Balance Assessment Timed Up and Go Test      Timed Up and Go Test   Normal TUG (seconds) 16.15   no AD                     Objective measurements completed on examination: See above findings.               PT Education - 05/25/20 1349    Education Details clinical findings, POC    Person(s) Educated Patient;Spouse    Methods Explanation    Comprehension Verbalized understanding               PT Short Term Goals - 05/27/20 1930      PT SHORT TERM GOAL #1   Title Pt will be independent with initial HEP in order to build upon functional gains made in therapy. ALL STGS DUE 07/01/20    Time 5   due to delay in scheduling   Period Weeks    Status New    Target Date 07/01/20      PT SHORT TERM GOAL #2    Title Pt will undergo further assessment of DGI/FGA in order to determine fall risk - LTG written as appropriate.    Time 5    Period Weeks    Status New      PT SHORT TERM GOAL #3   Title Pt will perform 4 steps with step through pattern with single handrail with supervision in order to demo improved functional BLE strength.    Baseline step to pattern ascending and descending    Time 5    Period Weeks    Status New      PT SHORT TERM GOAL #4   Title Pt will decr TUG to 14 seconds or less with no AD in order to demo decr fall risk.    Baseline 16.15 seconds    Time 5    Period Weeks    Status New      PT SHORT TERM GOAL #5   Title Pt will decr 5x sit <> stand from standard height chair to 18 seconds or less with no UE support in order to demo improved BLE strength.    Baseline 20 seconds    Time 5  Period Weeks    Status New            PT Long Term Goals - 05/27/20 1935      PT LONG TERM GOAL #1   Title Pt will be independent with final HEP in order to build upon functional gains made in therapy. ALL LTGS DUE 07/29/20    Time 9    Period Weeks    Status New    Target Date 07/29/20      PT LONG TERM GOAL #2   Title FGA/DGI goal to be written as appropriate to determine fall risk.    Time 9    Period Weeks    Status New      PT LONG TERM GOAL #3   Title Pt will ambulate 1,000' outdoors over paved/unlevel surfaces with no AD vs. LRAD with supervision in order to return to walking in the community.    Time 9    Period Weeks    Status New      PT LONG TERM GOAL #4   Title Pt will decr 5x sit <> stand from standard height chair to 16 seconds or less with no UE support in order to demo improved BLE strength.    Time 9    Period Weeks    Status New      PT LONG TERM GOAL #5   Title Pt will improve gait speed with no AD to at least 2.9 ft/sec in order to demo improved gait efficiency.    Baseline 2.46 ft/sec    Time 9    Period Weeks    Status New                       05/25/20 1355  Plan  Clinical Impression Statement Patient is a 64  year old female referred to Neuro OPPT s/p grade lV glioma resection from R temporal/parietal lobe (03/2020). Pt's PMH is significant for: arthritis, depression, OSA, R knee arthroscopy. The following deficits were present during the exam: impaired LLE sensation, impaired vision in L lower visual field, decreased BLE strength, decr endurance, gait abnormalities, impaired balance.  Based on TUG and 5x sit <> stand pt is at a high risk for falls. Pt's gait speed indicates that pt is a limited community ambulator. Pt would benefit from skilled PT to address these impairments and functional55 limitations to maximize functional mobility independence  Personal Factors and Comorbidities Comorbidity 3+  Comorbidities PMH includes arthritis, depression, OSA, R knee arthroscopy, s/p grade lV glioma resection from R temporal/parietal lobe (03/2020)  Examination-Activity Limitations Bathing;Bed Mobility;Locomotion Level;Stand;Toileting;Transfers;Squat;Stairs  Examination-Participation Restrictions Driving;Community Activity (walking outdoors)  Pt will benefit from skilled therapeutic intervention in order to improve on the following deficits Abnormal gait;Decreased activity tolerance;Decreased balance;Decreased endurance;Decreased coordination;Difficulty walking;Decreased strength;Impaired sensation;Impaired vision/preception  Stability/Clinical Decision Making Stable/Uncomplicated  Clinical Decision Making Low  Rehab Potential Good  PT Frequency 2x / week  PT Duration 8 weeks  PT Treatment/Interventions ADLs/Self Care Home Management;DME Instruction;Gait training;Stair training;Functional mobility training;Therapeutic activities;Therapeutic exercise;Patient/family education;Neuromuscular re-education;Balance training;Vestibular;Visual/perceptual remediation/compensation;Energy conservation  PT Next Visit Plan assess  DGI/FGA. initial HEP for balance/functional BLE strengthening. assess safety on treadmill as pt has one at home.  Consulted and Agree with Plan of Care Patient;Family member/caregiver    Patient will benefit from skilled therapeutic intervention in order to improve the following deficits and impairments:  Abnormal gait, Decreased activity tolerance, Decreased balance, Decreased endurance, Decreased coordination, Difficulty walking, Decreased strength, Impaired sensation, Impaired vision/preception  Visit Diagnosis: Unsteadiness on feet  History of falling  Muscle weakness (generalized)  Other symptoms and signs involving the nervous system  Other abnormalities of gait and mobility  Other disturbances of skin sensation     Problem List Patient Active Problem List   Diagnosis Date Noted  . Brain tumor, glioma ([place]) 04/19/2020  . Glioblastoma multiforme of temporal lobe ([place]) 04/13/2020  . Headache 04/13/2020  . Osteopenia 11/09/2018  . OSA (obstructive sleep apnea) 07/22/2016  . NASH (nonalcoholic steatohepatitis) 07/18/2016  . Fibrosis of liver 12/20/2015  . ACNE ROSACEA 01/01/2010  . 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 OSTEOARTHRITIS 12/16/2007  . VARICOSE VEIN, LWR EXTREMITIES W/INFLAMMATION 07/07/2007    Arliss Journey, PT, DPT  05/25/2020, 4:30 PM  [facility] [address] [place], [place], [address] Phone: [phone]   Fax:  [phone]  Name: Terry Bell MRN: [id number] Date of Birth: [birth year]

## 2020-05-27 NOTE — Addendum Note (Signed)
Addended by: Arliss Journey on: 05/27/2020 07:39 PM   Modules accepted: Orders

## 2020-05-28 ENCOUNTER — Ambulatory Visit
Admission: RE | Admit: 2020-05-28 | Discharge: 2020-05-28 | Disposition: A | Discharge status: Home / Self Care | Payer: 59 | Source: Ambulatory Visit | Attending: Radiation Oncology | Admitting: Radiation Oncology

## 2020-05-28 ENCOUNTER — Inpatient Hospital Stay: Payer: 59

## 2020-05-28 ENCOUNTER — Other Ambulatory Visit: Payer: Self-pay

## 2020-05-28 ENCOUNTER — Inpatient Hospital Stay (HOSPITAL_BASED_OUTPATIENT_CLINIC_OR_DEPARTMENT_OTHER): Payer: 59 | Admitting: Internal Medicine

## 2020-05-28 ENCOUNTER — Inpatient Hospital Stay: Payer: 59 | Admitting: Internal Medicine

## 2020-05-28 ENCOUNTER — Other Ambulatory Visit: Payer: 59

## 2020-05-28 ENCOUNTER — Ambulatory Visit: Payer: 59 | Attending: Neurosurgery | Admitting: Physical Therapy

## 2020-05-28 VITALS — BP 123/87 | HR 67 | Temp 97.5°F | Resp 18 | Ht 66.0 in | Wt 164.8 lb

## 2020-05-28 VITALS — BP 118/87 | HR 88

## 2020-05-28 DIAGNOSIS — R2689 Other abnormalities of gait and mobility: Secondary | ICD-10-CM | POA: Insufficient documentation

## 2020-05-28 DIAGNOSIS — Z9181 History of falling: Secondary | ICD-10-CM | POA: Insufficient documentation

## 2020-05-28 DIAGNOSIS — Z8249 Family history of ischemic heart disease and other diseases of the circulatory system: Secondary | ICD-10-CM | POA: Insufficient documentation

## 2020-05-28 DIAGNOSIS — Z801 Family history of malignant neoplasm of trachea, bronchus and lung: Secondary | ICD-10-CM | POA: Insufficient documentation

## 2020-05-28 DIAGNOSIS — Z7952 Long term (current) use of systemic steroids: Secondary | ICD-10-CM | POA: Insufficient documentation

## 2020-05-28 DIAGNOSIS — C712 Malignant neoplasm of temporal lobe: Secondary | ICD-10-CM | POA: Insufficient documentation

## 2020-05-28 DIAGNOSIS — G4733 Obstructive sleep apnea (adult) (pediatric): Secondary | ICD-10-CM | POA: Insufficient documentation

## 2020-05-28 DIAGNOSIS — Z833 Family history of diabetes mellitus: Secondary | ICD-10-CM | POA: Insufficient documentation

## 2020-05-28 DIAGNOSIS — Z510 Encounter for antineoplastic radiation therapy: Secondary | ICD-10-CM | POA: Insufficient documentation

## 2020-05-28 DIAGNOSIS — Z923 Personal history of irradiation: Secondary | ICD-10-CM | POA: Insufficient documentation

## 2020-05-28 DIAGNOSIS — Z79899 Other long term (current) drug therapy: Secondary | ICD-10-CM | POA: Insufficient documentation

## 2020-05-28 DIAGNOSIS — M6281 Muscle weakness (generalized): Secondary | ICD-10-CM | POA: Insufficient documentation

## 2020-05-28 DIAGNOSIS — D696 Thrombocytopenia, unspecified: Secondary | ICD-10-CM | POA: Insufficient documentation

## 2020-05-28 DIAGNOSIS — R2681 Unsteadiness on feet: Secondary | ICD-10-CM | POA: Diagnosis not present

## 2020-05-28 DIAGNOSIS — R29818 Other symptoms and signs involving the nervous system: Secondary | ICD-10-CM | POA: Insufficient documentation

## 2020-05-28 LAB — CMP (CANCER CENTER ONLY)
ALT: 35 U/L (ref 0–44)
AST: 30 U/L (ref 15–41)
Albumin: 4 g/dL (ref 3.5–5.0)
Alkaline Phosphatase: 62 U/L (ref 38–126)
Anion gap: 9 (ref 5–15)
BUN: 14 mg/dL (ref 8–23)
CO2: 23 mmol/L (ref 22–32)
Calcium: 9.8 mg/dL (ref 8.9–10.3)
Chloride: 108 mmol/L (ref 98–111)
Creatinine: 0.77 mg/dL (ref 0.44–1.00)
GFR, Est AFR Am: >60 mL/min (ref >60)
GFR, Estimated: >60 mL/min (ref >60)
Glucose, Bld: 104 mg/dL — ABNORMAL HIGH (ref 70–99)
Potassium: 3.9 mmol/L (ref 3.5–5.1)
Sodium: 140 mmol/L (ref 135–145)
Total Bilirubin: 1.1 mg/dL (ref 0.3–1.2)
Total Protein: 7.3 g/dL (ref 6.5–8.1)

## 2020-05-28 LAB — CBC WITH DIFFERENTIAL (CANCER CENTER ONLY)
Abs Immature Granulocytes: 0.03 10*3/uL (ref 0.00–0.07)
Basophils Absolute: 0.1 10*3/uL (ref 0.0–0.1)
Basophils Relative: 1 %
Eosinophils Absolute: 0.1 10*3/uL (ref 0.0–0.5)
Eosinophils Relative: 1 %
HCT: 42.8 % (ref 36.0–46.0)
Hemoglobin: 14.7 g/dL (ref 12.0–15.0)
Immature Granulocytes: 1 %
Lymphocytes Relative: 20 %
Lymphs Abs: 0.8 10*3/uL (ref 0.7–4.0)
MCH: 30.8 pg (ref 26.0–34.0)
MCHC: 34.3 g/dL (ref 30.0–36.0)
MCV: 89.7 fL (ref 80.0–100.0)
Monocytes Absolute: 0.5 10*3/uL (ref 0.1–1.0)
Monocytes Relative: 11 %
Neutro Abs: 2.7 10*3/uL (ref 1.7–7.7)
Neutrophils Relative %: 66 %
Platelet Count: 180 10*3/uL (ref 150–400)
RBC: 4.77 MIL/uL (ref 3.87–5.11)
RDW: 12.3 % (ref 11.5–15.5)
WBC Count: 4.1 10*3/uL (ref 4.0–10.5)
nRBC: 0 % (ref 0.0–0.2)

## 2020-05-28 NOTE — Patient Instructions (Signed)
Access Code: [email or web address] URL: [email or web address] Date: 05/28/2020 Prepared by: Janann August  Exercises Seated Hip Abduction with Resistance - 1 x daily - 5 x weekly - 1-2 sets - 10 reps Seated March - 1 x daily - 5 x weekly - 1-2 sets - 10 reps Seated Heel Toe Raises - 1 x daily - 5 x weekly - 1-2 sets - 10 reps Seated Long Arc Quad - 1 x daily - 5 x weekly - 1-2 sets - 10 reps Seated Hip Adduction Isometrics with Ball - 1 x daily - 5 x weekly - 1-2 sets - 10 reps

## 2020-05-28 NOTE — Therapy (Signed)
[facility] [address] [place], [place], [address] Phone: [phone]   Fax:  [phone]  Physical Therapy Treatment  Patient Details  Name: Terry Bell MRN: [id number]  Date of Birth: 10/04/1956 Referring Provider (PT): Ashok Pall, MD   Encounter Date: 05/28/2020   PT End of Session - 05/28/20 1425    Visit Number 2    Number of Visits 17    Date for PT Re-Evaluation 08/23/20   written for 60 day POC   Authorization Type [employer]    PT Start Time 1230    PT Stop Time 1315    PT Time Calculation (min) 45 min    Equipment Utilized During Treatment Gait belt    Activity Tolerance Patient limited by lethargy;Patient limited by fatigue    Behavior During Therapy [facility] for tasks assessed/performed   sleepy at times secondary to radiation today          Past Medical History:  Diagnosis Date  . Arthritis   . Chronic nonalcoholic liver disease   . Complication of anesthesia   . Continuous leakage of urine   . Depression    situational  . Diverticulosis   . OSA (obstructive sleep apnea) 07/22/2016  . PONV (postoperative nausea and vomiting)   . Steatohepatitis   . Tinnitus   . Varicose veins     Past Surgical History:  Procedure Laterality Date  . APPLICATION OF CRANIAL NAVIGATION N/A 04/19/2020   Procedure: APPLICATION OF CRANIAL NAVIGATION;  Surgeon: Ashok Pall, MD;  Location: [place];  Service: Neurosurgery;  Laterality: N/A;  . arthroscopic left knee sugery    . bilateral bunionectomy    . BREAST BIOPSY Left   . CHOLECYSTECTOMY    . colonsocopy    . CRANIOTOMY Right 04/19/2020   Procedure: Right Temporal craniotomy for tumor resection;  Surgeon: Ashok Pall, MD;  Location: [place];  Service: Neurosurgery;  Laterality: Right;  . debridement and removal and lateral release of torn cartlidge in rt knee    . gum grating    . rt knee arthroscopy    . torn cartledge in rt knee      Vitals:    05/28/20 1251  BP: 118/87  Pulse: 88     Subjective Assessment - 05/28/20 1235    Subjective Started radiation this morning. More fatigued today. No falls. Pt needing HHA from therapist and pt's husband to safely walk in to therapy session.    Patient is accompained by: Family member   husband Terry Bell   Pertinent History PMH includes arthritis, depression, OSA, R knee arthroscopy, urinary urgency    Patient Stated Goals wants to work on core and leg strength, wants to incr independence (go to the bathroom)    Currently in Pain? No/denies                          Access Code: [email or web address] URL: [email or web address] Date: 05/28/2020 Prepared by: Janann August  Initiated HEP for seated BLE strengthening. Pt seated in standard chair with arm rests when performing and pt with decr attention to L side and LUE and leaning heavily to the L. Used a mirror in front of pt as visual feedback for midline orientation and cues to shift over to the R with upright posture.   Exercises Seated Hip Abduction with Resistance - 1 x daily - 5 x weekly - 1-2 sets - 10 reps - use of red  theraband for resistance and incr proprioception to LLE Seated March - 1 x daily - 5 x weekly - 1-2 sets - 10 reps - use of red theraband for resistance and incr proprioception to LLE Seated Heel Toe Raises - 1 x daily - 5 x weekly - 1-2 sets - 10 reps Seated Long Arc Quad - 1 x daily - 5 x weekly - 1-2 sets - 10 reps - alternating between R and L, cues for slowed and controlled. Seated Hip Adduction Isometrics with Ball - 1 x daily - 5 x weekly - 1-2 sets - 10 reps     [facility] Adult PT Treatment/Exercise - 05/28/20 1430      Transfers   Transfers Stand Pivot Transfers    Sit to Stand 4: Min assist    Sit to Stand Details (indicate cue type and reason) pt needing min A to stand and with incr forward flexed posture and unable to stand fully erect, attempted with use of RW with pt needing min guard/min  A, needing manual cue to attend to LUE and place on RW to safely stand. with pt standing, therapist providing min A while pt's spouse wheeled manual wheelchair right behind pt for pt to sit down in before being wheeled out to the car at the end of the session    Stand Pivot Transfers --    Stand Pivot Transfer Details (indicate cue type and reason) attempted transfer from chair > clinic wheelchair at end of session with HHA from therapist and pt's husband, however pt unable to stand fully erect and take proper steps to sit down    Comments at end of session assisted husband in performing car transfer from w/c [place] with min A for safety. pt husband reports that they have a RW that they can use at home for safety with transfers when pt is more fatigued from radiation.        Ambulation/Gait   Ambulation/Gait Yes    Ambulation/Gait Assistance 4: Min assist   x2   Ambulation/Gait Assistance Details when ambulating back into clinic, pt needing min A and HHA from therapist and pt's husband to safely make it back to chair - pt with incr fatigue from radiation today. pt needing to be transferred to a manual w/c at end of session to safely exit clinic due to pt being too fatigued and unsafe to ambulate out    Ambulation Distance (Feet) 100 Feet    Assistive device 2 person hand held assist    Gait Pattern Wide base of support;Decreased trunk rotation;Left foot flat;Right foot flat;Decreased weight shift to left;Step-to pattern;Narrow base of support    Ambulation Surface Level;Indoor    Gait Comments discussed not using treadmill (pt and pt's spouse asking at last session) at home due to a decr in balance since seen last week for eval since starting radiation, discussed not safe at this time                   PT Education - 05/28/20 1425    Education Details see TA. initial HEP. discussed referral for OT and ST with pt and pt's spouse in agreement.    Person(s) Educated Patient;Spouse    Methods  Explanation;Demonstration;Handout    Comprehension Verbalized understanding;Returned demonstration            PT Short Term Goals - 05/27/20 1930      PT SHORT TERM GOAL #1   Title Pt will be independent with initial  HEP in order to build upon functional gains made in therapy. ALL STGS DUE 07/01/20    Time 5   due to delay in scheduling   Period Weeks    Status New    Target Date 07/01/20      PT SHORT TERM GOAL #2   Title Pt will undergo further assessment of DGI/FGA in order to determine fall risk - LTG written as appropriate.    Time 5    Period Weeks    Status New      PT SHORT TERM GOAL #3   Title Pt will perform 4 steps with step through pattern with single handrail with supervision in order to demo improved functional BLE strength.    Baseline step to pattern ascending and descending    Time 5    Period Weeks    Status New      PT SHORT TERM GOAL #4   Title Pt will decr TUG to 14 seconds or less with no AD in order to demo decr fall risk.    Baseline 16.15 seconds    Time 5    Period Weeks    Status New      PT SHORT TERM GOAL #5   Title Pt will decr 5x sit <> stand from standard height chair to 18 seconds or less with no UE support in order to demo improved BLE strength.    Baseline 20 seconds    Time 5    Period Weeks    Status New             PT Long Term Goals - 05/27/20 1935      PT LONG TERM GOAL #1   Title Pt will be independent with final HEP in order to build upon functional gains made in therapy. ALL LTGS DUE 07/29/20    Time 9    Period Weeks    Status New    Target Date 07/29/20      PT LONG TERM GOAL #2   Title FGA/DGI goal to be written as appropriate to determine fall risk.    Time 9    Period Weeks    Status New      PT LONG TERM GOAL #3   Title Pt will ambulate 1,000' outdoors over paved/unlevel surfaces with no AD vs. LRAD with supervision in order to return to walking in the community.    Time 9    Period Weeks    Status New        PT LONG TERM GOAL #4   Title Pt will decr 5x sit <> stand from standard height chair to 16 seconds or less with no UE support in order to demo improved BLE strength.    Time 9    Period Weeks    Status New      PT LONG TERM GOAL #5   Title Pt will improve gait speed with no AD to at least 2.9 ft/sec in order to demo improved gait efficiency.    Baseline 2.46 ft/sec    Time 9    Period Weeks    Status New                 Plan - 05/28/20 1437    Clinical Impression Statement Pt with increased fatigue today after starting radiation this morning. Pt needing HHA x2 (from therapist and pt's spouse) to ambulate into clinic. Pt with a decline in balance and strength since initial eval on Friday -  assessed pt's BP and WFL. Focus of today's skilled session was initiating seated BLE strengthening HEP. Pt needing visual cue of the mirror for midline orientation as pt with tendency to lean towards the L. Pt with incr fatigue and could not perform sit <> stand or stand pivot transfer with no AD - performed sit <> stand with RW and husband pulled up clinic manual w/c posteriorly to pt in order for therapist to safely wheel pt out to car. Discussed use of RW at home for safety with transfers and pt's spouse reports they have one at home. Will continue to progress towards LTGs.    Personal Factors and Comorbidities Comorbidity 3+    Comorbidities PMH includes arthritis, depression, OSA, R knee arthroscopy, s/p grade lV glioma resection from R temporal/parietal lobe (03/2020)    Examination-Activity Limitations Bathing;Bed Mobility;Locomotion Level;Stand;Toileting;Transfers;Squat;Stairs    Examination-Participation Restrictions Driving;Community Activity   walking outdoors   Stability/Clinical Decision Making Stable/Uncomplicated    Rehab Potential Good    PT Frequency 2x / week    PT Duration 8 weeks    PT Treatment/Interventions ADLs/Self Care Home Management;DME Instruction;Gait training;Stair  training;Functional mobility training;Therapeutic activities;Therapeutic exercise;Patient/family education;Neuromuscular re-education;Balance training;Vestibular;Visual/perceptual remediation/compensation;Energy conservation    PT Next Visit Plan assess DGI/FGA when safely able. gait/transfer training. how was initial HEP for balance/functional BLE strengthening. assess safety on treadmill as pt has one at home.    Consulted and Agree with Plan of Care Patient;Family member/caregiver           Patient will benefit from skilled therapeutic intervention in order to improve the following deficits and impairments:  Abnormal gait, Decreased activity tolerance, Decreased balance, Decreased endurance, Decreased coordination, Difficulty walking, Decreased strength, Impaired sensation, Impaired vision/preception  Visit Diagnosis: History of falling  Unsteadiness on feet  Muscle weakness (generalized)  Other symptoms and signs involving the nervous system  Other abnormalities of gait and mobility     Problem List Patient Active Problem List   Diagnosis Date Noted  . Brain tumor, glioma ([place]) 04/19/2020  . Glioblastoma multiforme of temporal lobe ([place]) 04/13/2020  . Headache 04/13/2020  . Osteopenia 11/09/2018  . OSA (obstructive sleep apnea) 07/22/2016  . NASH (nonalcoholic steatohepatitis) 07/18/2016  . Fibrosis of liver 12/20/2015  . ACNE ROSACEA 01/01/2010  . 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 OSTEOARTHRITIS 12/16/2007  . VARICOSE VEIN, LWR EXTREMITIES W/INFLAMMATION 07/07/2007    Terry Bell, PT, DPT  05/28/2020, 4:24 PM  [facility] [address] [place], [place], [address] Phone: [phone]   Fax:  [phone]  Name: Terry Bell MRN: [id number] Date of Birth: Nov 03, 1955

## 2020-05-28 NOTE — Progress Notes (Signed)
[facility] [place], [place] [address] [phone]   Interval Evaluation  Date of Service: 05/28/20 Patient Name: Terry Bell Patient MRN: [id number] Patient DOB: [birth year] Provider: Ventura Sellers, MD  Identifying Statement:  Terry Bell is a 64 y.o. female with right temporal glioblastoma   Referring Provider: Martinique, Betty G, MD [place],  [place] [address]  Oncologic History: Oncology History  Glioblastoma multiforme of temporal lobe [facility])  04/19/2020 Surgery   Craniotomy, right temporal resection with Dr. Christella Noa   05/28/2020 -  Radiation Therapy   IMRT with concurrent Temodar 24m/m2     Biomarkers:  MGMT Unknown.  IDH 1/2 Unknown.  EGFR Unknown  TERT Unknown   Interval History:  DDAGNY FIORENTINOpresents today following first scheduled radiation treatment.  No issue with Temodar this morning.  Has had a couple of falls and noticed more effort needed to use her left arm in particular.  It doesn't feel weak or clumsy, just isn't using it much compared to before.  Denies frank numbness but PT noticed left side had "less feeling" with regards to the leg.  H+P (04/26/20) Patient presented last week with several days of progressive new-onset frontal headache.  This may be have been associated with change in taste, but no other neurologic complaints.  CNS imaging demonstrated a large right temporal mass.  She underwent resection on 04/19/20 with Dr. CChristella Noa was discharged on 6/27.  She has no complaints today aside from some residual pain at the surgical site.  Does complain of "hole in vision" on the left side, but otherwise no neurologic deficits.  Taking decadron 465mtwice per day.  Medications: Current Outpatient Medications on File Prior to Visit  Medication Sig Dispense Refill   Cholecalciferol (VITAMIN D) 1000 UNITS capsule Take 1,000 Units by mouth 2 (two) times daily.       ondansetron  (ZOFRAN) 8 MG tablet Take 1 tablet (8 mg total) by mouth 2 (two) times daily as needed (nausea and vomiting). May take 30-60 minutes prior to Temodar administration if nausea/vomiting occurs. 30 tablet 1   Probiotic Product (ALIGN) 4 MG CAPS Take 4 mg by mouth daily.      temozolomide (TEMODAR) 140 MG capsule Take 1 capsule (140 mg total) by mouth daily. May take on an empty stomach to decrease nausea & vomiting. 42 capsule 0   No current facility-administered medications on file prior to visit.    Allergies:  Allergies  Allergen Reactions   Niacin Hives and Rash   Past Medical History:  Past Medical History:  Diagnosis Date   Arthritis    Chronic nonalcoholic liver disease    Complication of anesthesia    Continuous leakage of urine    Depression    situational   Diverticulosis    OSA (obstructive sleep apnea) 07/22/2016   PONV (postoperative nausea and vomiting)    Steatohepatitis    Tinnitus    Varicose veins    Past Surgical History:  Past Surgical History:  Procedure Laterality Date   APPLICATION OF CRANIAL NAVIGATION N/A 04/19/2020   Procedure: APPLICATION OF CRANIAL NAVIGATION;  Surgeon: CaAshok PallMD;  [facility] Service: Neurosurgery;  Laterality: N/A;   arthroscopic left knee sugery     bilateral bunionectomy     BREAST BIOPSY Left    CHOLECYSTECTOMY     colonsocopy     CRANIOTOMY Right 04/19/2020   Procedure: Right  Temporal craniotomy for tumor resection;  Surgeon: Ashok Pall, MD;  Location: [place];  Service: Neurosurgery;  Laterality: Right;   debridement and removal and lateral release of torn cartlidge in rt knee     gum grating     rt knee arthroscopy     torn cartledge in rt knee     Social History:  Social History   Socioeconomic History   Marital status: Married    Spouse name: Not on file   Number of children: Not on file   Years of education: Not on file   Highest education level: Not on file    Occupational History   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  Tobacco Use   Smoking status: Never Smoker   Smokeless tobacco: Never Used  Substance and Sexual Activity   Alcohol use: No   Drug use: No   Sexual activity: Not on file  Other Topics Concern   Not on file  Social History Narrative   -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 - [employer]      Regular exercise, healthy diet   Social Determinants of Health   Financial Resource Strain:    Difficulty of Paying Living Expenses:   Food Insecurity:    Worried About Charity fundraiser in the Last Year:    Arboriculturist in the Last Year:   Transportation Needs:    Film/video editor (Medical):    Lack of Transportation (Non-Medical):   Physical Activity:    Days of Exercise per Week:    Minutes of Exercise per Session:   Stress:    Feeling of Stress :   Social Connections:    Frequency of Communication with Friends and Family:    Frequency of Social Gatherings with Friends and Family:    Attends Religious Services:    Active Member of Clubs or Organizations:    Attends Music therapist:    Marital Status:   Intimate Partner Violence:    Fear of Current or Ex-Partner:    Emotionally Abused:    Physically Abused:    Sexually Abused:    Family History:  Family History  Problem Relation Age of Onset   Diabetes Mother    Liver disease Mother    Diabetes Sister    Liver disease Sister    Hypertension Other    Cancer Other    Hemochromatosis Other    Cancer Father        Throat   Hypertension Brother    Colon cancer Neg Hx    Breast cancer Neg Hx     Review of Systems: Constitutional: Doesn't report fevers, chills or abnormal weight loss Eyes: Doesn't report blurriness of vision Ears, nose, mouth, throat, and face: Doesn't report sore throat Respiratory: Doesn't report cough, dyspnea or wheezes Cardiovascular: Doesn't report palpitation, chest discomfort   Gastrointestinal:  Doesn't report nausea, constipation, diarrhea GU: Doesn't report incontinence Skin: Doesn't report skin rashes Neurological: Per HPI Musculoskeletal: Doesn't report joint pain Behavioral/Psych: Doesn't report anxiety  Physical Exam: Vitals:   05/28/20 1015  BP: (!) 123/87  Pulse: 67  Resp: 18  Temp: (!) 97.5 F (36.4 C)  SpO2: 100%   KPS: 90. General: Alert, cooperative, pleasant, in no acute distress Head: Normal EENT: No conjunctival injection or scleral icterus.  Lungs: Resp effort normal Cardiac: Regular rate Abdomen: Non-distended abdomen Skin: No rashes cyanosis or petechiae. Extremities: No clubbing or edema  Neurologic Exam: Mental Status: Awake, alert, attentive to examiner. Oriented to  self and environment. Language is fluent with intact comprehension.  Mild motor and sensory neglect.  Cranial Nerves: Visual acuity is grossly normal. Left lower quadrantanopia. Extra-ocular movements intact. No ptosis. Face is symmetric Motor: Tone and bulk are normal. Power is full in both arms and legs. Reflexes are symmetric, no pathologic reflexes present.  Sensory: Extinguishes left leg and arm. Gait: Deferred   Labs: I have reviewed the data as listed    Component Value Date/Time   NA 135 04/21/2020 0657   K 4.2 04/21/2020 0657   CL 101 04/21/2020 0657   CO2 21 (L) 04/21/2020 0657   GLUCOSE 102 (H) 04/21/2020 0657   BUN 13 04/21/2020 0657   CREATININE 0.74 04/21/2020 0657   CALCIUM 8.8 (L) 04/21/2020 0657   PROT 7.0 04/17/2020 1049   ALBUMIN 4.2 04/17/2020 1049   AST 26 04/17/2020 1049   ALT 47 (H) 04/17/2020 1049   ALKPHOS 45 04/17/2020 1049   BILITOT 1.3 (H) 04/17/2020 1049   GFRNONAA >60 04/21/2020 0657   GFRAA >60 04/21/2020 0657   Lab Results  Component Value Date   WBC 4.1 05/28/2020   NEUTROABS 2.7 05/28/2020   HGB 14.7 05/28/2020   HCT 42.8 05/28/2020   MCV 89.7 05/28/2020   PLT 180 05/28/2020     Assessment/Plan Glioblastoma multiforme of temporal lobe ([place]) [C71.2]   LAUREN MODISETTE presents today with modest clinical changes, consistent with modest left hemi-neglect syndrome. Labs are reviewed and WNL.  We are encouraged that she has been consented for CMV vaccine trial at [facility], though randomization will not occur until after RT is completed.  She is cleared to proceed with intensity modulated radiation therapy and concurrent daily Temozolomide.  Radiation will be administered Mon-Fri over 6 weeks, Temodar will be dosed at 23m/m2 to be given daily over 42 days.  We reviewed side effects of temodar, including fatigue, nausea/vomiting, constipation, and cytopenias.  Chemotherapy should be held for the following:  ANC less than 1,000  Platelets less than 100,000  LFT or creatinine greater than 2x ULN  If clinical concerns/contraindications develop  Will defer corticosteroids at this time; may consider if left side functional impairment progresses during RT.  As discussed, every 2 weeks during radiation, labs will be checked accompanied by a clinical evaluation in the brain tumor clinic.  All questions were answered. The patient knows to call the clinic with any problems, questions or concerns. No barriers to learning were detected.  I have spent a total of 40 minutes of face-to-face and non-face-to-face time, excluding clinical staff time, preparing to see patient, ordering tests and/or medications, counseling the patient, and documenting clinical information in the electronic or other health record    ZVentura Sellers MD Medical Director of Neuro-Oncology [facility]08/02/21 10:16 AM

## 2020-05-29 ENCOUNTER — Ambulatory Visit
Admission: RE | Admit: 2020-05-29 | Discharge: 2020-05-29 | Disposition: A | Discharge status: Home / Self Care | Payer: 59 | Source: Ambulatory Visit | Attending: Radiation Oncology | Admitting: Radiation Oncology

## 2020-05-29 ENCOUNTER — Telehealth: Payer: Self-pay | Admitting: *Deleted

## 2020-05-29 ENCOUNTER — Telehealth: Payer: Self-pay | Admitting: Internal Medicine

## 2020-05-29 ENCOUNTER — Encounter: Payer: Self-pay | Admitting: Internal Medicine

## 2020-05-29 ENCOUNTER — Other Ambulatory Visit: Payer: Self-pay

## 2020-05-29 ENCOUNTER — Other Ambulatory Visit: Payer: Self-pay | Admitting: *Deleted

## 2020-05-29 DIAGNOSIS — C712 Malignant neoplasm of temporal lobe: Secondary | ICD-10-CM | POA: Diagnosis not present

## 2020-05-29 DIAGNOSIS — R2689 Other abnormalities of gait and mobility: Secondary | ICD-10-CM | POA: Diagnosis not present

## 2020-05-29 DIAGNOSIS — R2681 Unsteadiness on feet: Secondary | ICD-10-CM | POA: Diagnosis not present

## 2020-05-29 DIAGNOSIS — M6281 Muscle weakness (generalized): Secondary | ICD-10-CM | POA: Diagnosis not present

## 2020-05-29 DIAGNOSIS — R29818 Other symptoms and signs involving the nervous system: Secondary | ICD-10-CM | POA: Diagnosis not present

## 2020-05-29 DIAGNOSIS — Z9181 History of falling: Secondary | ICD-10-CM | POA: Diagnosis not present

## 2020-05-29 NOTE — Telephone Encounter (Signed)
Scheduled per 8/2 los. Pt is aware of appt time and date.

## 2020-05-29 NOTE — Telephone Encounter (Signed)
Patient diagnosed with glioblastoma and in need of hospital bed.  Needs to reposition frequently.  Bed type requested semi electric with trapeze bar and gel overlay.

## 2020-05-30 ENCOUNTER — Other Ambulatory Visit: Payer: Self-pay

## 2020-05-30 ENCOUNTER — Ambulatory Visit
Admission: RE | Admit: 2020-05-30 | Discharge: 2020-05-30 | Disposition: A | Discharge status: Home / Self Care | Payer: 59 | Source: Ambulatory Visit | Attending: Radiation Oncology | Admitting: Radiation Oncology

## 2020-05-30 DIAGNOSIS — M6281 Muscle weakness (generalized): Secondary | ICD-10-CM | POA: Diagnosis not present

## 2020-05-30 DIAGNOSIS — C712 Malignant neoplasm of temporal lobe: Secondary | ICD-10-CM | POA: Diagnosis not present

## 2020-05-30 DIAGNOSIS — R2681 Unsteadiness on feet: Secondary | ICD-10-CM | POA: Diagnosis not present

## 2020-05-30 DIAGNOSIS — R2689 Other abnormalities of gait and mobility: Secondary | ICD-10-CM | POA: Diagnosis not present

## 2020-05-30 DIAGNOSIS — Z9181 History of falling: Secondary | ICD-10-CM | POA: Diagnosis not present

## 2020-05-30 DIAGNOSIS — R29818 Other symptoms and signs involving the nervous system: Secondary | ICD-10-CM | POA: Diagnosis not present

## 2020-05-31 ENCOUNTER — Ambulatory Visit
Admission: RE | Admit: 2020-05-31 | Discharge: 2020-05-31 | Disposition: A | Discharge status: Home / Self Care | Payer: 59 | Source: Ambulatory Visit | Attending: Radiation Oncology | Admitting: Radiation Oncology

## 2020-05-31 ENCOUNTER — Telehealth: Payer: Self-pay | Admitting: Physical Therapy

## 2020-05-31 ENCOUNTER — Other Ambulatory Visit: Payer: Self-pay

## 2020-05-31 DIAGNOSIS — C712 Malignant neoplasm of temporal lobe: Secondary | ICD-10-CM | POA: Diagnosis not present

## 2020-05-31 DIAGNOSIS — R2689 Other abnormalities of gait and mobility: Secondary | ICD-10-CM | POA: Diagnosis not present

## 2020-05-31 DIAGNOSIS — M6281 Muscle weakness (generalized): Secondary | ICD-10-CM | POA: Diagnosis not present

## 2020-05-31 DIAGNOSIS — R29818 Other symptoms and signs involving the nervous system: Secondary | ICD-10-CM | POA: Diagnosis not present

## 2020-05-31 DIAGNOSIS — C711 Malignant neoplasm of frontal lobe: Secondary | ICD-10-CM | POA: Diagnosis not present

## 2020-05-31 DIAGNOSIS — Z9181 History of falling: Secondary | ICD-10-CM | POA: Diagnosis not present

## 2020-05-31 DIAGNOSIS — R2681 Unsteadiness on feet: Secondary | ICD-10-CM | POA: Diagnosis not present

## 2020-05-31 NOTE — Telephone Encounter (Signed)
Dr. Mickeal Skinner, Terry Bell was evaluated by Physical Therapy at [facility]. The patient would benefit from a ST and OT evaluation for cognitive/memory deficits and decr use/attention of LUE.   If you agree, please place an order in [facility] workque in [facility] or fax the order to [phone]. Thank you, Janann August, PT, DPT 05/31/20 11:47 AM    [facility] [address] [place], [place]  [address] Phone:  [phone] Fax:  [phone]

## 2020-06-01 ENCOUNTER — Ambulatory Visit
Admission: RE | Admit: 2020-06-01 | Discharge: 2020-06-01 | Disposition: A | Discharge status: Home / Self Care | Payer: 59 | Source: Ambulatory Visit | Attending: Radiation Oncology | Admitting: Radiation Oncology

## 2020-06-01 ENCOUNTER — Other Ambulatory Visit: Payer: Self-pay

## 2020-06-01 ENCOUNTER — Other Ambulatory Visit: Payer: Self-pay | Admitting: Radiation Oncology

## 2020-06-01 DIAGNOSIS — R29818 Other symptoms and signs involving the nervous system: Secondary | ICD-10-CM | POA: Diagnosis not present

## 2020-06-01 DIAGNOSIS — C718 Malignant neoplasm of overlapping sites of brain: Secondary | ICD-10-CM | POA: Diagnosis not present

## 2020-06-01 DIAGNOSIS — C712 Malignant neoplasm of temporal lobe: Secondary | ICD-10-CM

## 2020-06-01 DIAGNOSIS — M6281 Muscle weakness (generalized): Secondary | ICD-10-CM | POA: Diagnosis not present

## 2020-06-01 DIAGNOSIS — Z9181 History of falling: Secondary | ICD-10-CM | POA: Diagnosis not present

## 2020-06-01 DIAGNOSIS — R2681 Unsteadiness on feet: Secondary | ICD-10-CM | POA: Diagnosis not present

## 2020-06-01 DIAGNOSIS — R2689 Other abnormalities of gait and mobility: Secondary | ICD-10-CM | POA: Diagnosis not present

## 2020-06-01 MED ORDER — LORAZEPAM 1 MG PO TABS
1.0000 mg | ORAL_TABLET | Freq: Three times a day (TID) | ORAL | 0 refills | Status: DC
Start: 2020-06-01 — End: 2020-07-09

## 2020-06-01 MED ORDER — SONAFINE EX EMUL
1.0000 "application " | Freq: Once | CUTANEOUS | Status: AC
  Administered 2020-06-01: 1 via TOPICAL

## 2020-06-01 MED FILL — LORazepam 1 MG TABS: 1 | 20 days supply | Qty: 60 | Fill #0

## 2020-06-01 MED FILL — ONDANSETRON HCL 4 MG TABS: 4 | 10 days supply | Qty: 30 | Fill #0

## 2020-06-01 NOTE — Progress Notes (Signed)
Pt here for patient teaching.  Pt given Radiation and You booklet, skin care instructions and Sonafine.  Reviewed areas of pertinence such as fatigue, hair loss, nausea and vomiting, skin changes, headache and blurry vision . Pt able to give teach back of to pat skin and use unscented/gentle soap,apply Sonafine bid and avoid applying anything to skin within 4 hours of treatment. Pt verbalizes understanding of information given and will contact nursing with any questions or concerns. Husband present for education.    Gloriajean Dell. Leonie Green, BSN

## 2020-06-04 ENCOUNTER — Other Ambulatory Visit: Payer: Self-pay

## 2020-06-04 ENCOUNTER — Encounter: Payer: Self-pay | Admitting: Radiation Oncology

## 2020-06-04 ENCOUNTER — Encounter: Payer: Self-pay | Admitting: Internal Medicine

## 2020-06-04 ENCOUNTER — Ambulatory Visit
Admission: RE | Admit: 2020-06-04 | Discharge: 2020-06-04 | Disposition: A | Discharge status: Home / Self Care | Payer: 59 | Source: Ambulatory Visit | Attending: Radiation Oncology | Admitting: Radiation Oncology

## 2020-06-04 ENCOUNTER — Other Ambulatory Visit: Payer: Self-pay | Admitting: Radiation Oncology

## 2020-06-04 ENCOUNTER — Telehealth: Payer: Self-pay

## 2020-06-04 DIAGNOSIS — R2681 Unsteadiness on feet: Secondary | ICD-10-CM | POA: Diagnosis not present

## 2020-06-04 DIAGNOSIS — C718 Malignant neoplasm of overlapping sites of brain: Secondary | ICD-10-CM | POA: Diagnosis not present

## 2020-06-04 DIAGNOSIS — R2689 Other abnormalities of gait and mobility: Secondary | ICD-10-CM | POA: Diagnosis not present

## 2020-06-04 DIAGNOSIS — M6281 Muscle weakness (generalized): Secondary | ICD-10-CM | POA: Diagnosis not present

## 2020-06-04 DIAGNOSIS — Z9181 History of falling: Secondary | ICD-10-CM | POA: Diagnosis not present

## 2020-06-04 DIAGNOSIS — C712 Malignant neoplasm of temporal lobe: Secondary | ICD-10-CM | POA: Diagnosis not present

## 2020-06-04 DIAGNOSIS — R29818 Other symptoms and signs involving the nervous system: Secondary | ICD-10-CM | POA: Diagnosis not present

## 2020-06-04 MED ORDER — DEXAMETHASONE 4 MG PO TABS
4.0000 mg | ORAL_TABLET | Freq: Two times a day (BID) | ORAL | 0 refills | Status: DC
Start: 2020-06-04 — End: 2020-07-06

## 2020-06-04 MED FILL — DEXAMETHASONE 4 MG TABLET: 4 | 30 days supply | Qty: 60 | Fill #0

## 2020-06-04 NOTE — Progress Notes (Signed)
I was called about the patient while working remotely. She started having more nausea last week and was started on Ativan. This has helped somewhat but made her more groggy. The patient's husband has noticed progressive left sided weakness and possibly some right sided weakness. She is no longer taking Dexamethasone but I believe she would benefit from this. I'll copy Dr. Mickeal Skinner, but encouraged nursing to have the patient restart Dexamethasone 4 mg BID until Dr. Mickeal Skinner can weigh in to see if this helps her symptoms.    Carola Rhine, PAC

## 2020-06-04 NOTE — Telephone Encounter (Signed)
Received in basket message abut pt. to see if it is OK for Terry Bell to have a liquid protein drink since she is not eating very much? This is one that her nurse friend recommended:  LiquaCel Liquid Protein: 1 oz. serving offers 16 grams of protein and 2.5 grams of added arginine. 100% [place] Collagen  Collagen is a great protein for helping with Hair, Skin, & Nails Superb taste with 5 available flavors No mixing required Sugar Free Soy, lactose, & gluten free Awaiting reply from Dr Mickeal Skinner

## 2020-06-04 NOTE — Telephone Encounter (Signed)
Returned TC to pt. Spoke with Husband Mitch. Let him know that that per Dr Mickeal Skinner Yes no problem with liquid protein drink. Husband verbalized understanding.

## 2020-06-04 NOTE — Progress Notes (Signed)
Decadron 49m BID x3 days, then decrease to 446mdaily.  I will continue to titrate when I see her again next Monday.  ZaVentura SellersMD

## 2020-06-05 ENCOUNTER — Ambulatory Visit
Admission: RE | Admit: 2020-06-05 | Discharge: 2020-06-05 | Disposition: A | Discharge status: Home / Self Care | Payer: 59 | Source: Ambulatory Visit | Attending: Radiation Oncology | Admitting: Radiation Oncology

## 2020-06-05 ENCOUNTER — Other Ambulatory Visit: Payer: Self-pay

## 2020-06-05 DIAGNOSIS — R2689 Other abnormalities of gait and mobility: Secondary | ICD-10-CM | POA: Diagnosis not present

## 2020-06-05 DIAGNOSIS — R2681 Unsteadiness on feet: Secondary | ICD-10-CM | POA: Diagnosis not present

## 2020-06-05 DIAGNOSIS — Z9181 History of falling: Secondary | ICD-10-CM | POA: Diagnosis not present

## 2020-06-05 DIAGNOSIS — R29818 Other symptoms and signs involving the nervous system: Secondary | ICD-10-CM | POA: Diagnosis not present

## 2020-06-05 DIAGNOSIS — C712 Malignant neoplasm of temporal lobe: Secondary | ICD-10-CM | POA: Diagnosis not present

## 2020-06-05 DIAGNOSIS — M6281 Muscle weakness (generalized): Secondary | ICD-10-CM | POA: Diagnosis not present

## 2020-06-06 ENCOUNTER — Ambulatory Visit
Admission: RE | Admit: 2020-06-06 | Discharge: 2020-06-06 | Disposition: A | Discharge status: Home / Self Care | Payer: 59 | Source: Ambulatory Visit | Attending: Radiation Oncology | Admitting: Radiation Oncology

## 2020-06-06 ENCOUNTER — Other Ambulatory Visit: Payer: Self-pay

## 2020-06-06 DIAGNOSIS — R2689 Other abnormalities of gait and mobility: Secondary | ICD-10-CM | POA: Diagnosis not present

## 2020-06-06 DIAGNOSIS — Z9181 History of falling: Secondary | ICD-10-CM | POA: Diagnosis not present

## 2020-06-06 DIAGNOSIS — C712 Malignant neoplasm of temporal lobe: Secondary | ICD-10-CM | POA: Diagnosis not present

## 2020-06-06 DIAGNOSIS — R29818 Other symptoms and signs involving the nervous system: Secondary | ICD-10-CM | POA: Diagnosis not present

## 2020-06-06 DIAGNOSIS — R2681 Unsteadiness on feet: Secondary | ICD-10-CM | POA: Diagnosis not present

## 2020-06-06 DIAGNOSIS — M6281 Muscle weakness (generalized): Secondary | ICD-10-CM | POA: Diagnosis not present

## 2020-06-07 ENCOUNTER — Ambulatory Visit
Admission: RE | Admit: 2020-06-07 | Discharge: 2020-06-07 | Disposition: A | Discharge status: Home / Self Care | Payer: 59 | Source: Ambulatory Visit | Attending: Radiation Oncology | Admitting: Radiation Oncology

## 2020-06-07 ENCOUNTER — Other Ambulatory Visit: Payer: Self-pay

## 2020-06-07 DIAGNOSIS — R2681 Unsteadiness on feet: Secondary | ICD-10-CM | POA: Diagnosis not present

## 2020-06-07 DIAGNOSIS — Z9181 History of falling: Secondary | ICD-10-CM | POA: Diagnosis not present

## 2020-06-07 DIAGNOSIS — R2689 Other abnormalities of gait and mobility: Secondary | ICD-10-CM | POA: Diagnosis not present

## 2020-06-07 DIAGNOSIS — C712 Malignant neoplasm of temporal lobe: Secondary | ICD-10-CM | POA: Diagnosis not present

## 2020-06-07 DIAGNOSIS — R29818 Other symptoms and signs involving the nervous system: Secondary | ICD-10-CM | POA: Diagnosis not present

## 2020-06-07 DIAGNOSIS — M6281 Muscle weakness (generalized): Secondary | ICD-10-CM | POA: Diagnosis not present

## 2020-06-08 ENCOUNTER — Other Ambulatory Visit: Payer: Self-pay

## 2020-06-08 ENCOUNTER — Ambulatory Visit
Admission: RE | Admit: 2020-06-08 | Discharge: 2020-06-08 | Disposition: A | Discharge status: Home / Self Care | Payer: 59 | Source: Ambulatory Visit | Attending: Radiation Oncology | Admitting: Radiation Oncology

## 2020-06-08 ENCOUNTER — Encounter (HOSPITAL_COMMUNITY): Payer: Self-pay

## 2020-06-08 DIAGNOSIS — M6281 Muscle weakness (generalized): Secondary | ICD-10-CM | POA: Diagnosis not present

## 2020-06-08 DIAGNOSIS — R2689 Other abnormalities of gait and mobility: Secondary | ICD-10-CM | POA: Diagnosis not present

## 2020-06-08 DIAGNOSIS — C712 Malignant neoplasm of temporal lobe: Secondary | ICD-10-CM | POA: Diagnosis not present

## 2020-06-08 DIAGNOSIS — R2681 Unsteadiness on feet: Secondary | ICD-10-CM | POA: Diagnosis not present

## 2020-06-08 DIAGNOSIS — R29818 Other symptoms and signs involving the nervous system: Secondary | ICD-10-CM | POA: Diagnosis not present

## 2020-06-08 DIAGNOSIS — Z9181 History of falling: Secondary | ICD-10-CM | POA: Diagnosis not present

## 2020-06-11 ENCOUNTER — Ambulatory Visit
Admission: RE | Admit: 2020-06-11 | Discharge: 2020-06-11 | Disposition: A | Discharge status: Home / Self Care | Payer: 59 | Source: Ambulatory Visit | Attending: Radiation Oncology | Admitting: Radiation Oncology

## 2020-06-11 ENCOUNTER — Other Ambulatory Visit: Payer: Self-pay | Admitting: Internal Medicine

## 2020-06-11 ENCOUNTER — Inpatient Hospital Stay (HOSPITAL_BASED_OUTPATIENT_CLINIC_OR_DEPARTMENT_OTHER): Payer: 59 | Admitting: Internal Medicine

## 2020-06-11 ENCOUNTER — Other Ambulatory Visit: Payer: Self-pay

## 2020-06-11 ENCOUNTER — Other Ambulatory Visit: Payer: 59

## 2020-06-11 ENCOUNTER — Ambulatory Visit: Payer: 59 | Admitting: Internal Medicine

## 2020-06-11 ENCOUNTER — Inpatient Hospital Stay: Payer: 59

## 2020-06-11 VITALS — BP 123/86 | HR 59 | Temp 96.7°F | Resp 18 | Ht 66.0 in

## 2020-06-11 DIAGNOSIS — R358 Other polyuria: Secondary | ICD-10-CM | POA: Diagnosis not present

## 2020-06-11 DIAGNOSIS — C712 Malignant neoplasm of temporal lobe: Secondary | ICD-10-CM

## 2020-06-11 DIAGNOSIS — Z9181 History of falling: Secondary | ICD-10-CM | POA: Diagnosis not present

## 2020-06-11 DIAGNOSIS — R3589 Other polyuria: Secondary | ICD-10-CM

## 2020-06-11 DIAGNOSIS — R350 Frequency of micturition: Secondary | ICD-10-CM | POA: Diagnosis not present

## 2020-06-11 DIAGNOSIS — R2681 Unsteadiness on feet: Secondary | ICD-10-CM | POA: Diagnosis not present

## 2020-06-11 DIAGNOSIS — M6281 Muscle weakness (generalized): Secondary | ICD-10-CM | POA: Diagnosis not present

## 2020-06-11 DIAGNOSIS — R2689 Other abnormalities of gait and mobility: Secondary | ICD-10-CM | POA: Diagnosis not present

## 2020-06-11 DIAGNOSIS — R29818 Other symptoms and signs involving the nervous system: Secondary | ICD-10-CM | POA: Diagnosis not present

## 2020-06-11 LAB — CMP (CANCER CENTER ONLY)
ALT: 171 U/L — ABNORMAL HIGH (ref 0–44)
AST: 64 U/L — ABNORMAL HIGH (ref 15–41)
Albumin: 4 g/dL (ref 3.5–5.0)
Alkaline Phosphatase: 73 U/L (ref 38–126)
Anion gap: 11 (ref 5–15)
BUN: 19 mg/dL (ref 8–23)
CO2: 21 mmol/L — ABNORMAL LOW (ref 22–32)
Calcium: 10.5 mg/dL — ABNORMAL HIGH (ref 8.9–10.3)
Chloride: 105 mmol/L (ref 98–111)
Creatinine: 0.76 mg/dL (ref 0.44–1.00)
GFR, Est AFR Am: >60 mL/min (ref >60)
GFR, Estimated: >60 mL/min (ref >60)
Glucose, Bld: 118 mg/dL — ABNORMAL HIGH (ref 70–99)
Potassium: 4.3 mmol/L (ref 3.5–5.1)
Sodium: 137 mmol/L (ref 135–145)
Total Bilirubin: 1.2 mg/dL (ref 0.3–1.2)
Total Protein: 7.3 g/dL (ref 6.5–8.1)

## 2020-06-11 LAB — CBC WITH DIFFERENTIAL (CANCER CENTER ONLY)
Abs Immature Granulocytes: 0.1 10*3/uL — ABNORMAL HIGH (ref 0.00–0.07)
Basophils Absolute: 0 10*3/uL (ref 0.0–0.1)
Basophils Relative: 0 %
Eosinophils Absolute: 0 10*3/uL (ref 0.0–0.5)
Eosinophils Relative: 0 %
HCT: 45.8 % (ref 36.0–46.0)
Hemoglobin: 16.1 g/dL — ABNORMAL HIGH (ref 12.0–15.0)
Immature Granulocytes: 1 %
Lymphocytes Relative: 4 %
Lymphs Abs: 0.4 10*3/uL — ABNORMAL LOW (ref 0.7–4.0)
MCH: 31.4 pg (ref 26.0–34.0)
MCHC: 35.2 g/dL (ref 30.0–36.0)
MCV: 89.5 fL (ref 80.0–100.0)
Monocytes Absolute: 0.6 10*3/uL (ref 0.1–1.0)
Monocytes Relative: 6 %
Neutro Abs: 8.7 10*3/uL — ABNORMAL HIGH (ref 1.7–7.7)
Neutrophils Relative %: 89 %
Platelet Count: 164 10*3/uL (ref 150–400)
RBC: 5.12 MIL/uL — ABNORMAL HIGH (ref 3.87–5.11)
RDW: 12.5 % (ref 11.5–15.5)
WBC Count: 9.8 10*3/uL (ref 4.0–10.5)
nRBC: 0 % (ref 0.0–0.2)

## 2020-06-11 LAB — URINALYSIS, COMPLETE (UACMP) WITH MICROSCOPIC
Bilirubin Urine: NEGATIVE
Glucose, UA: NEGATIVE mg/dL
Ketones, ur: NEGATIVE mg/dL
Nitrite: NEGATIVE
Protein, ur: 100 mg/dL — AB
Specific Gravity, Urine: 1.012 (ref 1.005–1.030)
WBC, UA: >50 WBC/hpf — ABNORMAL HIGH (ref 0–5)
pH: 6 (ref 5.0–8.0)

## 2020-06-11 MED ORDER — CIPROFLOXACIN HCL 250 MG PO TABS
250.0000 mg | ORAL_TABLET | Freq: Two times a day (BID) | ORAL | 0 refills | Status: AC
Start: 2020-06-11 — End: 2020-06-16

## 2020-06-11 MED FILL — CIPROFLOXACIN HCL 250 MG TA: 250 | 5 days supply | Qty: 10 | Fill #0

## 2020-06-11 NOTE — Progress Notes (Signed)
[place] at [place], [place] [address] [phone]  Interval Evaluation  Date of Service: 06/11/20 Patient Name: Terry Bell Patient MRN: [id number] Patient DOB: [birth year] Provider: Ventura Sellers, MD  Identifying Statement:  Terry Bell is a 64 y.o. female with right temporal glioblastoma   Referring Provider: Martinique, Betty G, MD [place],  [place] [address]  Oncologic History: Oncology History  Glioblastoma multiforme of temporal lobe [facility])  04/19/2020 Surgery   Craniotomy, right temporal resection with Dr. Christella Noa   05/28/2020 -  Radiation Therapy   IMRT with concurrent Temodar 27m/m2     Biomarkers:  MGMT Unknown.  IDH 1/2 Unknown.  EGFR Unknown  TERT Unknown   Interval History:  Terry Bell today, now having completed 2 weeks of radiation and temozolomide.  Continues with decadron 411mtwice per day. No recurrence of nausea/vomiting with higher dose of zofran prior to chemo.  Left sided weakness is stable, continues to rely mostly on wheelchair getting around the home.  Constipation has been persistent problem recently.  H+P (04/26/20) Patient presented last week with several days of progressive new-onset frontal headache.  This may be have been associated with change in taste, but no other neurologic complaints.  CNS imaging demonstrated a large right temporal mass.  She underwent resection on 04/19/20 with Dr. CaChristella Noawas discharged on 6/27.  She has no complaints today aside from some residual pain at the surgical site.  Does complain of "hole in vision" on the left side, but otherwise no neurologic deficits.  Taking decadron 65m59mwice per day.  Medications: Current Outpatient Medications on File Prior to Visit  Medication Sig Dispense Refill  . Cholecalciferol (VITAMIN D) 1000 UNITS capsule Take 1,000 Units by mouth 2 (two) times daily.      . dMarland Kitchenxamethasone (DECADRON) 4 MG  tablet Take 1 tablet (4 mg total) by mouth 2 (two) times daily with a meal. 60 tablet 0  . LORazepam (ATIVAN) 1 MG tablet Take 1 tablet (1 mg total) by mouth every 8 (eight) hours. 60 tablet 0  . ondansetron (ZOFRAN) 8 MG tablet Take 1 tablet (8 mg total) by mouth 2 (two) times daily as needed (nausea and vomiting). May take 30-60 minutes prior to Temodar administration if nausea/vomiting occurs. 30 tablet 1  . Probiotic Product (ALIGN) 4 MG CAPS Take 4 mg by mouth daily.     . tMarland Kitchenmozolomide (TEMODAR) 140 MG capsule Take 1 capsule (140 mg total) by mouth daily. May take on an empty stomach to decrease nausea & vomiting. 42 capsule 0   No current facility-administered medications on file prior to visit.    Allergies:  Allergies  Allergen Reactions  . Niacin Hives and Rash   Past Medical History:  Past Medical History:  Diagnosis Date  . Arthritis   . Chronic nonalcoholic liver disease   . Complication of anesthesia   . Continuous leakage of urine   . Depression    situational  . Diverticulosis   . OSA (obstructive sleep apnea) 07/22/2016  . PONV (postoperative nausea and vomiting)   . Steatohepatitis   . Tinnitus   . Varicose veins    Past Surgical History:  Past Surgical History:  Procedure Laterality Date  . APPLICATION OF CRANIAL NAVIGATION N/A 04/19/2020   Procedure: APPLICATION OF CRANIAL NAVIGATION;  Surgeon: CabAshok PallD;  [facility]  Laterality: N/A;  . arthroscopic left  knee sugery    . bilateral bunionectomy    . BREAST BIOPSY Left   . CHOLECYSTECTOMY    . colonsocopy    . CRANIOTOMY Right 04/19/2020   Procedure: Right Temporal craniotomy for tumor resection;  Surgeon: Ashok Pall, MD;  Location: [place];  Service: Neurosurgery;  Laterality: Right;  . debridement and removal and lateral release of torn cartlidge in rt knee    . gum grating    . rt knee arthroscopy    . torn cartledge in rt knee     Social History:  Social History    Socioeconomic History  . Marital status: Married    Spouse name: Not on file  . Number of children: Not on file  . Years of education: Not on file  . Highest education level: Not on file  Occupational History  . 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 Smoking status: Never Smoker  . Smokeless tobacco: Never Used  Substance and Sexual Activity  . Alcohol use: No  . Drug use: No  . Sexual activity: Not on file  Other Topics Concern  . 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 Difficulty of Paying Living Expenses:   Food Insecurity:   . Worried About Charity fundraiser in the Last Year:   . Arboriculturist in the Last Year:   Transportation Needs:   . Film/video editor (Medical):   Marland Kitchen Lack of Transportation (Non-Medical):   Physical Activity:   . Days of Exercise per Week:   . Minutes of Exercise per Session:   Stress:   . Feeling of Stress :   Social Connections:   . Frequency of Communication with Friends and Family:   . Frequency of Social Gatherings with Friends and Family:   . Attends Religious Services:   . Active Member of Clubs or Organizations:   . Attends Archivist Meetings:   Marland Kitchen Marital Status:   Intimate Partner Violence:   . Fear of Current or Ex-Partner:   . Emotionally Abused:   Marland Kitchen Physically Abused:   . Sexually Abused:    Family History:  Family History  Problem Relation Age of Onset  . Diabetes Mother   . Liver disease Mother   . Diabetes Sister   . Liver disease Sister   . Hypertension Other   . Cancer Other   . Hemochromatosis Other   . Cancer Father        Throat  . Hypertension Brother   . Colon cancer Neg Hx   . Breast cancer Neg Hx     Review of Systems: Constitutional: Doesn't report fevers, chills or abnormal weight loss Eyes: Doesn't report blurriness of  vision Ears, nose, mouth, throat, and face: Doesn't report sore throat Respiratory: Doesn't report cough, dyspnea or wheezes Cardiovascular: Doesn't report palpitation, chest discomfort  Gastrointestinal:  Doesn't report nausea, constipation, diarrhea GU: Doesn't report incontinence Skin: Doesn't report skin rashes Neurological: Per HPI Musculoskeletal: Doesn't report joint pain Behavioral/Psych: Doesn't report anxiety  Physical Exam: Vitals:   06/11/20 1007  BP: 123/86  Pulse: (!) 59  Resp: 18  Temp: (!) 96.7 F (35.9 C)  SpO2: 100%   KPS: 90. General: Alert, cooperative, pleasant, in no acute distress Head: Normal EENT: No conjunctival injection or scleral icterus.  Lungs: Resp effort normal Cardiac: Regular rate Abdomen: Non-distended abdomen Skin: No rashes cyanosis or petechiae. Extremities: No clubbing or edema  Neurologic Exam: Mental Status: Awake, alert, attentive to examiner. Oriented to self and environment. Language is fluent with intact comprehension.  Mild motor and sensory neglect.  Cranial Nerves: Visual acuity is grossly normal. Left lower quadrantanopia. Extra-ocular movements intact. No ptosis. Face is symmetric Motor: Tone and bulk are normal. Power is 4/5 in left arm and leg. Reflexes are symmetric, no pathologic reflexes present.  Sensory: Extinguishes left leg and arm. Gait: Deferred   Labs: I have reviewed the data as listed    Component Value Date/Time   NA 140 05/28/2020 0943   K 3.9 05/28/2020 0943   CL 108 05/28/2020 0943   CO2 23 05/28/2020 0943   GLUCOSE 104 (H) 05/28/2020 0943   BUN 14 05/28/2020 0943   CREATININE 0.77 05/28/2020 0943   CALCIUM 9.8 05/28/2020 0943   PROT 7.3 05/28/2020 0943   ALBUMIN 4.0 05/28/2020 0943   AST 30 05/28/2020 0943   ALT 35 05/28/2020 0943   ALKPHOS 62 05/28/2020 0943   BILITOT 1.1 05/28/2020 0943   GFRNONAA >60 05/28/2020 0943   GFRAA >60 05/28/2020 0943   Lab Results  Component Value Date    WBC 9.8 06/11/2020   NEUTROABS 8.7 (H) 06/11/2020   HGB 16.1 (H) 06/11/2020   HCT 45.8 06/11/2020   MCV 89.5 06/11/2020   PLT 164 06/11/2020    Assessment/Plan Glioblastoma multiforme of temporal lobe ([place]) [C71.2]   MEIRA WAHBA is clinically stable today, following decline which resolved partially with initiation of dexamethasone therapy.  Left sided motor deficits are consistent with right fronto-temporal malignancy and likely inflammation.  She should continue with intensity modulated radiation therapy and concurrent daily Temozolomide.  Radiation will be administered Mon-Fri over 6 weeks, Temodar will be dosed at 34m/m2 to be given daily over 42 days.  We reviewed side effects of temodar, including fatigue, nausea/vomiting, constipation, and cytopenias.  Chemotherapy should be held for the following:  ANC less than 1,000  Platelets less than 100,000  LFT or creatinine greater than 2x ULN  If clinical concerns/contraindications develop  Will recommend decreasing decadron to 456m2mg for 4 days, then decrease to 4m75maily thereafter if tolerated.  Will obtain urine specimen for culture today due to urinary frequency and foul smelling urine.  May use milk of magnesia PRN if Miralax is ineffective for constipation.  May also consider dc'ing zofran and utilizing alternate agent for nausea/vomiting.  Order will be placed for home physical and occupational therapy given gait and transport issues.  She should return to clinic in 2 weeks with labs for review.  All questions were answered. The patient knows to call the clinic with any problems, questions or concerns. No barriers to learning were detected.   I have spent a total of 40 minutes of face-to-face and non-face-to-face time, excluding clinical staff time, preparing to see patient, ordering tests and/or medications, counseling the patient, and documenting clinical information in the electronic or other health  record    ZacVentura SellersD Medical Director of Neuro-Oncology [place]/16/21 10:28 AM

## 2020-06-12 ENCOUNTER — Telehealth: Payer: Self-pay | Admitting: *Deleted

## 2020-06-12 ENCOUNTER — Other Ambulatory Visit: Payer: Self-pay

## 2020-06-12 ENCOUNTER — Ambulatory Visit
Admission: RE | Admit: 2020-06-12 | Discharge: 2020-06-12 | Disposition: A | Discharge status: Home / Self Care | Payer: 59 | Source: Ambulatory Visit | Attending: Radiation Oncology | Admitting: Radiation Oncology

## 2020-06-12 ENCOUNTER — Ambulatory Visit: Payer: 59 | Admitting: Physical Therapy

## 2020-06-12 DIAGNOSIS — C712 Malignant neoplasm of temporal lobe: Secondary | ICD-10-CM | POA: Diagnosis not present

## 2020-06-12 DIAGNOSIS — R29818 Other symptoms and signs involving the nervous system: Secondary | ICD-10-CM | POA: Diagnosis not present

## 2020-06-12 DIAGNOSIS — R2689 Other abnormalities of gait and mobility: Secondary | ICD-10-CM | POA: Diagnosis not present

## 2020-06-12 DIAGNOSIS — Z9181 History of falling: Secondary | ICD-10-CM | POA: Diagnosis not present

## 2020-06-12 DIAGNOSIS — R2681 Unsteadiness on feet: Secondary | ICD-10-CM | POA: Diagnosis not present

## 2020-06-12 DIAGNOSIS — M6281 Muscle weakness (generalized): Secondary | ICD-10-CM | POA: Diagnosis not present

## 2020-06-12 NOTE — Telephone Encounter (Signed)
Canceled Outpatient Neuro Rehab due to difficulties with addittional outings from the home due to ambulation/transfer difficulties.  Ordered in Home PT and OT.  Attempted to obtain services with [facility] and they were unable to assist.  Faxed referral to [facility], pending review process to advise if they will accept.

## 2020-06-13 ENCOUNTER — Ambulatory Visit
Admission: RE | Admit: 2020-06-13 | Discharge: 2020-06-13 | Disposition: A | Discharge status: Home / Self Care | Payer: 59 | Source: Ambulatory Visit | Attending: Radiation Oncology | Admitting: Radiation Oncology

## 2020-06-13 DIAGNOSIS — R29818 Other symptoms and signs involving the nervous system: Secondary | ICD-10-CM | POA: Diagnosis not present

## 2020-06-13 DIAGNOSIS — C712 Malignant neoplasm of temporal lobe: Secondary | ICD-10-CM | POA: Diagnosis not present

## 2020-06-13 DIAGNOSIS — R2681 Unsteadiness on feet: Secondary | ICD-10-CM | POA: Diagnosis not present

## 2020-06-13 DIAGNOSIS — Z9181 History of falling: Secondary | ICD-10-CM | POA: Diagnosis not present

## 2020-06-13 DIAGNOSIS — R2689 Other abnormalities of gait and mobility: Secondary | ICD-10-CM | POA: Diagnosis not present

## 2020-06-13 DIAGNOSIS — M6281 Muscle weakness (generalized): Secondary | ICD-10-CM | POA: Diagnosis not present

## 2020-06-13 LAB — URINE CULTURE: Culture: >=100000 — AB

## 2020-06-14 ENCOUNTER — Other Ambulatory Visit: Payer: Self-pay

## 2020-06-14 ENCOUNTER — Ambulatory Visit: Payer: 59

## 2020-06-14 ENCOUNTER — Telehealth: Payer: Self-pay

## 2020-06-14 ENCOUNTER — Ambulatory Visit
Admission: RE | Admit: 2020-06-14 | Discharge: 2020-06-14 | Disposition: A | Discharge status: Home / Self Care | Payer: 59 | Source: Ambulatory Visit | Attending: Radiation Oncology | Admitting: Radiation Oncology

## 2020-06-14 DIAGNOSIS — R2689 Other abnormalities of gait and mobility: Secondary | ICD-10-CM | POA: Diagnosis not present

## 2020-06-14 DIAGNOSIS — R29818 Other symptoms and signs involving the nervous system: Secondary | ICD-10-CM | POA: Diagnosis not present

## 2020-06-14 DIAGNOSIS — C712 Malignant neoplasm of temporal lobe: Secondary | ICD-10-CM | POA: Diagnosis not present

## 2020-06-14 DIAGNOSIS — M6281 Muscle weakness (generalized): Secondary | ICD-10-CM | POA: Diagnosis not present

## 2020-06-14 DIAGNOSIS — Z9181 History of falling: Secondary | ICD-10-CM | POA: Diagnosis not present

## 2020-06-14 DIAGNOSIS — R2681 Unsteadiness on feet: Secondary | ICD-10-CM | POA: Diagnosis not present

## 2020-06-14 MED FILL — TEMOZOLOMIDE 140 MG CAPS: 140 | 14 days supply | Qty: 14 | Fill #1

## 2020-06-14 NOTE — Telephone Encounter (Signed)
Received TC that Julianne Rice sent a refferal to [facility] for this patient. they just called stating that they are unable to accept patient due to case management team being too full  Made Beth RN aware of phone call since she is working with Mickeal Skinner today so that this could be followed up on.

## 2020-06-15 ENCOUNTER — Other Ambulatory Visit: Payer: Self-pay

## 2020-06-15 ENCOUNTER — Ambulatory Visit
Admission: RE | Admit: 2020-06-15 | Discharge: 2020-06-15 | Disposition: A | Discharge status: Home / Self Care | Payer: 59 | Source: Ambulatory Visit | Attending: Radiation Oncology | Admitting: Radiation Oncology

## 2020-06-15 DIAGNOSIS — M6281 Muscle weakness (generalized): Secondary | ICD-10-CM | POA: Diagnosis not present

## 2020-06-15 DIAGNOSIS — R29818 Other symptoms and signs involving the nervous system: Secondary | ICD-10-CM | POA: Diagnosis not present

## 2020-06-15 DIAGNOSIS — C712 Malignant neoplasm of temporal lobe: Secondary | ICD-10-CM | POA: Diagnosis not present

## 2020-06-15 DIAGNOSIS — Z9181 History of falling: Secondary | ICD-10-CM | POA: Diagnosis not present

## 2020-06-15 DIAGNOSIS — R2689 Other abnormalities of gait and mobility: Secondary | ICD-10-CM | POA: Diagnosis not present

## 2020-06-15 DIAGNOSIS — R2681 Unsteadiness on feet: Secondary | ICD-10-CM | POA: Diagnosis not present

## 2020-06-18 ENCOUNTER — Other Ambulatory Visit: Payer: Self-pay

## 2020-06-18 ENCOUNTER — Ambulatory Visit
Admission: RE | Admit: 2020-06-18 | Discharge: 2020-06-18 | Disposition: A | Discharge status: Home / Self Care | Payer: 59 | Source: Ambulatory Visit | Attending: Radiation Oncology | Admitting: Radiation Oncology

## 2020-06-18 DIAGNOSIS — R2681 Unsteadiness on feet: Secondary | ICD-10-CM | POA: Diagnosis not present

## 2020-06-18 DIAGNOSIS — M6281 Muscle weakness (generalized): Secondary | ICD-10-CM | POA: Diagnosis not present

## 2020-06-18 DIAGNOSIS — R29818 Other symptoms and signs involving the nervous system: Secondary | ICD-10-CM | POA: Diagnosis not present

## 2020-06-18 DIAGNOSIS — C712 Malignant neoplasm of temporal lobe: Secondary | ICD-10-CM | POA: Diagnosis not present

## 2020-06-18 DIAGNOSIS — Z9181 History of falling: Secondary | ICD-10-CM | POA: Diagnosis not present

## 2020-06-18 DIAGNOSIS — R2689 Other abnormalities of gait and mobility: Secondary | ICD-10-CM | POA: Diagnosis not present

## 2020-06-19 ENCOUNTER — Ambulatory Visit: Payer: 59 | Admitting: Physical Therapy

## 2020-06-19 ENCOUNTER — Telehealth: Payer: Self-pay | Admitting: *Deleted

## 2020-06-19 ENCOUNTER — Ambulatory Visit
Admission: RE | Admit: 2020-06-19 | Discharge: 2020-06-19 | Disposition: A | Discharge status: Home / Self Care | Payer: 59 | Source: Ambulatory Visit | Attending: Radiation Oncology | Admitting: Radiation Oncology

## 2020-06-19 ENCOUNTER — Other Ambulatory Visit: Payer: Self-pay

## 2020-06-19 DIAGNOSIS — R29818 Other symptoms and signs involving the nervous system: Secondary | ICD-10-CM | POA: Diagnosis not present

## 2020-06-19 DIAGNOSIS — R2681 Unsteadiness on feet: Secondary | ICD-10-CM | POA: Diagnosis not present

## 2020-06-19 DIAGNOSIS — C712 Malignant neoplasm of temporal lobe: Secondary | ICD-10-CM | POA: Diagnosis not present

## 2020-06-19 DIAGNOSIS — Z9181 History of falling: Secondary | ICD-10-CM | POA: Diagnosis not present

## 2020-06-19 DIAGNOSIS — M6281 Muscle weakness (generalized): Secondary | ICD-10-CM | POA: Diagnosis not present

## 2020-06-19 DIAGNOSIS — R2689 Other abnormalities of gait and mobility: Secondary | ICD-10-CM | POA: Diagnosis not present

## 2020-06-19 NOTE — Telephone Encounter (Signed)
Since Terry Bell was unable to accept referral faxed new referral to [facility], pending response.

## 2020-06-20 ENCOUNTER — Ambulatory Visit
Admission: RE | Admit: 2020-06-20 | Discharge: 2020-06-20 | Disposition: A | Discharge status: Home / Self Care | Payer: 59 | Source: Ambulatory Visit | Attending: Radiation Oncology | Admitting: Radiation Oncology

## 2020-06-20 ENCOUNTER — Other Ambulatory Visit: Payer: Self-pay

## 2020-06-20 DIAGNOSIS — C712 Malignant neoplasm of temporal lobe: Secondary | ICD-10-CM | POA: Diagnosis not present

## 2020-06-20 DIAGNOSIS — Z9181 History of falling: Secondary | ICD-10-CM | POA: Diagnosis not present

## 2020-06-20 DIAGNOSIS — M6281 Muscle weakness (generalized): Secondary | ICD-10-CM | POA: Diagnosis not present

## 2020-06-20 DIAGNOSIS — R2689 Other abnormalities of gait and mobility: Secondary | ICD-10-CM | POA: Diagnosis not present

## 2020-06-20 DIAGNOSIS — R29818 Other symptoms and signs involving the nervous system: Secondary | ICD-10-CM | POA: Diagnosis not present

## 2020-06-20 DIAGNOSIS — R2681 Unsteadiness on feet: Secondary | ICD-10-CM | POA: Diagnosis not present

## 2020-06-21 ENCOUNTER — Telehealth: Payer: Self-pay | Admitting: *Deleted

## 2020-06-21 ENCOUNTER — Other Ambulatory Visit: Payer: Self-pay

## 2020-06-21 ENCOUNTER — Ambulatory Visit
Admission: RE | Admit: 2020-06-21 | Discharge: 2020-06-21 | Disposition: A | Discharge status: Home / Self Care | Payer: 59 | Source: Ambulatory Visit | Attending: Radiation Oncology | Admitting: Radiation Oncology

## 2020-06-21 ENCOUNTER — Ambulatory Visit: Payer: 59 | Admitting: Physical Therapy

## 2020-06-21 DIAGNOSIS — R2681 Unsteadiness on feet: Secondary | ICD-10-CM | POA: Diagnosis not present

## 2020-06-21 DIAGNOSIS — R29818 Other symptoms and signs involving the nervous system: Secondary | ICD-10-CM | POA: Diagnosis not present

## 2020-06-21 DIAGNOSIS — R2689 Other abnormalities of gait and mobility: Secondary | ICD-10-CM | POA: Diagnosis not present

## 2020-06-21 DIAGNOSIS — M6281 Muscle weakness (generalized): Secondary | ICD-10-CM | POA: Diagnosis not present

## 2020-06-21 DIAGNOSIS — Z9181 History of falling: Secondary | ICD-10-CM | POA: Diagnosis not present

## 2020-06-21 DIAGNOSIS — C712 Malignant neoplasm of temporal lobe: Secondary | ICD-10-CM | POA: Diagnosis not present

## 2020-06-21 NOTE — Telephone Encounter (Signed)
[facility] accepted referral for PT/OT.  They have attempted to reach patient, provided husbands number as secondary.

## 2020-06-22 ENCOUNTER — Ambulatory Visit
Admission: RE | Admit: 2020-06-22 | Discharge: 2020-06-22 | Disposition: A | Discharge status: Home / Self Care | Payer: 59 | Source: Ambulatory Visit | Attending: Radiation Oncology | Admitting: Radiation Oncology

## 2020-06-22 ENCOUNTER — Other Ambulatory Visit: Payer: Self-pay

## 2020-06-22 DIAGNOSIS — R29818 Other symptoms and signs involving the nervous system: Secondary | ICD-10-CM | POA: Diagnosis not present

## 2020-06-22 DIAGNOSIS — Z9181 History of falling: Secondary | ICD-10-CM | POA: Diagnosis not present

## 2020-06-22 DIAGNOSIS — R414 Neurologic neglect syndrome: Secondary | ICD-10-CM | POA: Diagnosis not present

## 2020-06-22 DIAGNOSIS — C712 Malignant neoplasm of temporal lobe: Secondary | ICD-10-CM | POA: Diagnosis not present

## 2020-06-22 DIAGNOSIS — M1990 Unspecified osteoarthritis, unspecified site: Secondary | ICD-10-CM | POA: Diagnosis not present

## 2020-06-22 DIAGNOSIS — R2689 Other abnormalities of gait and mobility: Secondary | ICD-10-CM | POA: Diagnosis not present

## 2020-06-22 DIAGNOSIS — R2681 Unsteadiness on feet: Secondary | ICD-10-CM | POA: Diagnosis not present

## 2020-06-22 DIAGNOSIS — F4321 Adjustment disorder with depressed mood: Secondary | ICD-10-CM | POA: Diagnosis not present

## 2020-06-22 DIAGNOSIS — M6281 Muscle weakness (generalized): Secondary | ICD-10-CM | POA: Diagnosis not present

## 2020-06-22 DIAGNOSIS — G4733 Obstructive sleep apnea (adult) (pediatric): Secondary | ICD-10-CM | POA: Diagnosis not present

## 2020-06-22 DIAGNOSIS — K7581 Nonalcoholic steatohepatitis (NASH): Secondary | ICD-10-CM | POA: Diagnosis not present

## 2020-06-22 DIAGNOSIS — G8194 Hemiplegia, unspecified affecting left nondominant side: Secondary | ICD-10-CM | POA: Diagnosis not present

## 2020-06-22 DIAGNOSIS — I8390 Asymptomatic varicose veins of unspecified lower extremity: Secondary | ICD-10-CM | POA: Diagnosis not present

## 2020-06-22 DIAGNOSIS — Z483 Aftercare following surgery for neoplasm: Secondary | ICD-10-CM | POA: Diagnosis not present

## 2020-06-25 ENCOUNTER — Inpatient Hospital Stay: Payer: 59

## 2020-06-25 ENCOUNTER — Ambulatory Visit: Payer: 59 | Admitting: Internal Medicine

## 2020-06-25 ENCOUNTER — Ambulatory Visit
Admission: RE | Admit: 2020-06-25 | Discharge: 2020-06-25 | Disposition: A | Discharge status: Home / Self Care | Payer: 59 | Source: Ambulatory Visit | Attending: Radiation Oncology | Admitting: Radiation Oncology

## 2020-06-25 ENCOUNTER — Other Ambulatory Visit: Payer: Self-pay

## 2020-06-25 ENCOUNTER — Other Ambulatory Visit: Payer: Self-pay | Admitting: *Deleted

## 2020-06-25 ENCOUNTER — Inpatient Hospital Stay: Payer: 59 | Admitting: Internal Medicine

## 2020-06-25 ENCOUNTER — Other Ambulatory Visit: Payer: 59

## 2020-06-25 DIAGNOSIS — M6281 Muscle weakness (generalized): Secondary | ICD-10-CM | POA: Diagnosis not present

## 2020-06-25 DIAGNOSIS — R414 Neurologic neglect syndrome: Secondary | ICD-10-CM | POA: Diagnosis not present

## 2020-06-25 DIAGNOSIS — R350 Frequency of micturition: Secondary | ICD-10-CM

## 2020-06-25 DIAGNOSIS — M1990 Unspecified osteoarthritis, unspecified site: Secondary | ICD-10-CM | POA: Diagnosis not present

## 2020-06-25 DIAGNOSIS — Z483 Aftercare following surgery for neoplasm: Secondary | ICD-10-CM | POA: Diagnosis not present

## 2020-06-25 DIAGNOSIS — F4321 Adjustment disorder with depressed mood: Secondary | ICD-10-CM | POA: Diagnosis not present

## 2020-06-25 DIAGNOSIS — R2689 Other abnormalities of gait and mobility: Secondary | ICD-10-CM | POA: Diagnosis not present

## 2020-06-25 DIAGNOSIS — C712 Malignant neoplasm of temporal lobe: Secondary | ICD-10-CM

## 2020-06-25 DIAGNOSIS — R3589 Other polyuria: Secondary | ICD-10-CM

## 2020-06-25 DIAGNOSIS — R2681 Unsteadiness on feet: Secondary | ICD-10-CM | POA: Diagnosis not present

## 2020-06-25 DIAGNOSIS — Z9181 History of falling: Secondary | ICD-10-CM | POA: Diagnosis not present

## 2020-06-25 DIAGNOSIS — R29818 Other symptoms and signs involving the nervous system: Secondary | ICD-10-CM | POA: Diagnosis not present

## 2020-06-25 DIAGNOSIS — I8390 Asymptomatic varicose veins of unspecified lower extremity: Secondary | ICD-10-CM | POA: Diagnosis not present

## 2020-06-25 DIAGNOSIS — G8194 Hemiplegia, unspecified affecting left nondominant side: Secondary | ICD-10-CM | POA: Diagnosis not present

## 2020-06-25 DIAGNOSIS — K7581 Nonalcoholic steatohepatitis (NASH): Secondary | ICD-10-CM | POA: Diagnosis not present

## 2020-06-25 DIAGNOSIS — G4733 Obstructive sleep apnea (adult) (pediatric): Secondary | ICD-10-CM | POA: Diagnosis not present

## 2020-06-25 LAB — CMP (CANCER CENTER ONLY)
ALT: 301 U/L (ref 0–44)
AST: 91 U/L — ABNORMAL HIGH (ref 15–41)
Albumin: 3.8 g/dL (ref 3.5–5.0)
Alkaline Phosphatase: 59 U/L (ref 38–126)
Anion gap: 9 (ref 5–15)
BUN: 12 mg/dL (ref 8–23)
CO2: 24 mmol/L (ref 22–32)
Calcium: 10.5 mg/dL — ABNORMAL HIGH (ref 8.9–10.3)
Chloride: 105 mmol/L (ref 98–111)
Creatinine: 0.72 mg/dL (ref 0.44–1.00)
GFR, Est AFR Am: >60 mL/min (ref >60)
GFR, Estimated: >60 mL/min (ref >60)
Glucose, Bld: 95 mg/dL (ref 70–99)
Potassium: 3.8 mmol/L (ref 3.5–5.1)
Sodium: 138 mmol/L (ref 135–145)
Total Bilirubin: 1.5 mg/dL — ABNORMAL HIGH (ref 0.3–1.2)
Total Protein: 6.6 g/dL (ref 6.5–8.1)

## 2020-06-25 LAB — CBC WITH DIFFERENTIAL (CANCER CENTER ONLY)
Abs Immature Granulocytes: 0.05 10*3/uL (ref 0.00–0.07)
Basophils Absolute: 0 10*3/uL (ref 0.0–0.1)
Basophils Relative: 0 %
Eosinophils Absolute: 0.1 10*3/uL (ref 0.0–0.5)
Eosinophils Relative: 1 %
HCT: 43.4 % (ref 36.0–46.0)
Hemoglobin: 15.1 g/dL — ABNORMAL HIGH (ref 12.0–15.0)
Immature Granulocytes: 1 %
Lymphocytes Relative: 8 %
Lymphs Abs: 0.5 10*3/uL — ABNORMAL LOW (ref 0.7–4.0)
MCH: 31.9 pg (ref 26.0–34.0)
MCHC: 34.8 g/dL (ref 30.0–36.0)
MCV: 91.6 fL (ref 80.0–100.0)
Monocytes Absolute: 0.5 10*3/uL (ref 0.1–1.0)
Monocytes Relative: 8 %
Neutro Abs: 4.9 10*3/uL (ref 1.7–7.7)
Neutrophils Relative %: 82 %
Platelet Count: 97 10*3/uL — ABNORMAL LOW (ref 150–400)
RBC: 4.74 MIL/uL (ref 3.87–5.11)
RDW: 14.6 % (ref 11.5–15.5)
WBC Count: 5.9 10*3/uL (ref 4.0–10.5)
nRBC: 0 % (ref 0.0–0.2)

## 2020-06-25 NOTE — Progress Notes (Signed)
Patients spouse states that patient completed her antibiotic for UTI and odor to urine resolved but since this past weekend it has returned.    Will collect another urine specimen to determine if UTI was not resolved.

## 2020-06-26 ENCOUNTER — Other Ambulatory Visit: Payer: Self-pay

## 2020-06-26 ENCOUNTER — Other Ambulatory Visit: Payer: Self-pay | Admitting: *Deleted

## 2020-06-26 ENCOUNTER — Ambulatory Visit: Payer: 59

## 2020-06-26 ENCOUNTER — Ambulatory Visit
Admission: RE | Admit: 2020-06-26 | Discharge: 2020-06-26 | Disposition: A | Discharge status: Home / Self Care | Payer: 59 | Source: Ambulatory Visit | Attending: Radiation Oncology | Admitting: Radiation Oncology

## 2020-06-26 ENCOUNTER — Other Ambulatory Visit: Payer: 59

## 2020-06-26 ENCOUNTER — Inpatient Hospital Stay (HOSPITAL_BASED_OUTPATIENT_CLINIC_OR_DEPARTMENT_OTHER): Payer: 59 | Admitting: Internal Medicine

## 2020-06-26 VITALS — BP 135/84 | HR 63 | Temp 97.1°F | Resp 18 | Ht 66.0 in | Wt 158.4 lb

## 2020-06-26 DIAGNOSIS — Z9181 History of falling: Secondary | ICD-10-CM | POA: Diagnosis not present

## 2020-06-26 DIAGNOSIS — R3589 Other polyuria: Secondary | ICD-10-CM

## 2020-06-26 DIAGNOSIS — C712 Malignant neoplasm of temporal lobe: Secondary | ICD-10-CM

## 2020-06-26 DIAGNOSIS — R350 Frequency of micturition: Secondary | ICD-10-CM

## 2020-06-26 DIAGNOSIS — R29818 Other symptoms and signs involving the nervous system: Secondary | ICD-10-CM | POA: Diagnosis not present

## 2020-06-26 DIAGNOSIS — R2689 Other abnormalities of gait and mobility: Secondary | ICD-10-CM | POA: Diagnosis not present

## 2020-06-26 DIAGNOSIS — M6281 Muscle weakness (generalized): Secondary | ICD-10-CM | POA: Diagnosis not present

## 2020-06-26 DIAGNOSIS — R2681 Unsteadiness on feet: Secondary | ICD-10-CM | POA: Diagnosis not present

## 2020-06-26 LAB — URINALYSIS, COMPLETE (UACMP) WITH MICROSCOPIC
Bilirubin Urine: NEGATIVE
Glucose, UA: NEGATIVE mg/dL
Hgb urine dipstick: NEGATIVE
Ketones, ur: NEGATIVE mg/dL
Leukocytes,Ua: NEGATIVE
Nitrite: NEGATIVE
Protein, ur: NEGATIVE mg/dL
Specific Gravity, Urine: 1.005 (ref 1.005–1.030)
pH: 7 (ref 5.0–8.0)

## 2020-06-26 NOTE — Progress Notes (Signed)
[place] at [address], [place] [address] [phone]  Interval Evaluation  Date of Service: 06/26/20 Patient Name: Terry Bell Patient MRN: [id number] Patient DOB: 07-22-1956 Provider: Ventura Sellers, MD  Identifying Statement:  Terry Bell is a 64 y.o. female with right temporal glioblastoma   Referring Provider: Martinique, Betty G, MD [place],  [place] [address]  Oncologic History: Oncology History  Glioblastoma multiforme of temporal lobe [facility])  04/19/2020 Surgery   Craniotomy, right temporal resection with Dr. Christella Noa   05/28/2020 -  Radiation Therapy   IMRT with concurrent Temodar 77m/m2     Biomarkers:  MGMT Methylated.  IDH 1/2 Wild type.  EGFR Not expressed  TERT "Mutated   Interval History:  Terry Bell today, now having completed 4 weeks of radiation and temozolomide.  No recurrence of UTI symptoms following treatment with cipro.  Overall improving functional status following steroids and antibiotics, continues with decadron 437mtwice per day. No recurrence of nausea/vomiting with higher dose of zofran prior to chemo.  Mostly relying on walker for ambulation, with guidance from PT.  Constipation has been persistent problem recently.  H+P (04/26/20) Patient presented last week with several days of progressive new-onset frontal headache.  This may be have been associated with change in taste, but no other neurologic complaints.  CNS imaging demonstrated a large right temporal mass.  She underwent resection on 04/19/20 with Dr. CaChristella Noawas discharged on 6/27.  She has no complaints today aside from some residual pain at the surgical site.  Does complain of "hole in vision" on the left side, but otherwise no neurologic deficits.  Taking decadron 13m23mwice per day.  Medications: Current Outpatient Medications on File Prior to Visit  Medication Sig Dispense Refill  . Cholecalciferol  (VITAMIN D) 1000 UNITS capsule Take 1,000 Units by mouth 2 (two) times daily.      . dMarland Kitchenxamethasone (DECADRON) 4 MG tablet Take 1 tablet (4 mg total) by mouth 2 (two) times daily with a meal. 60 tablet 0  . LORazepam (ATIVAN) 1 MG tablet Take 1 tablet (1 mg total) by mouth every 8 (eight) hours. 60 tablet 0  . ondansetron (ZOFRAN) 8 MG tablet Take 1 tablet (8 mg total) by mouth 2 (two) times daily as needed (nausea and vomiting). May take 30-60 minutes prior to Temodar administration if nausea/vomiting occurs. 30 tablet 1  . Probiotic Product (ALIGN) 4 MG CAPS Take 4 mg by mouth daily.     . tMarland Kitchenmozolomide (TEMODAR) 140 MG capsule Take 1 capsule (140 mg total) by mouth daily. May take on an empty stomach to decrease nausea & vomiting. 42 capsule 0   No current facility-administered medications on file prior to visit.    Allergies:  Allergies  Allergen Reactions  . Niacin Hives and Rash   Past Medical History:  Past Medical History:  Diagnosis Date  . Arthritis   . Chronic nonalcoholic liver disease   . Complication of anesthesia   . Continuous leakage of urine   . Depression    situational  . Diverticulosis   . OSA (obstructive sleep apnea) 07/22/2016  . PONV (postoperative nausea and vomiting)   . Steatohepatitis   . Tinnitus   . Varicose veins    Past Surgical History:  Past Surgical History:  Procedure Laterality Date  . APPLICATION OF CRANIAL NAVIGATION N/A 04/19/2020   Procedure: APPLICATION OF CRANIAL NAVIGATION;  Surgeon: CabAshok Pall  MD;  Location: [facility];  Service: Neurosurgery;  Laterality: N/A;  . arthroscopic left knee sugery    . bilateral bunionectomy    . BREAST BIOPSY Left   . CHOLECYSTECTOMY    . colonsocopy    . CRANIOTOMY Right 04/19/2020   Procedure: Right Temporal craniotomy for tumor resection;  Surgeon: Ashok Pall, MD;  Location: [facility];  Service: Neurosurgery;  Laterality: Right;  . debridement and removal and lateral release of torn cartlidge in rt  knee    . gum grating    . rt knee arthroscopy    . torn cartledge in rt knee     Social History:  Social History   Socioeconomic History  . Marital status: Married    Spouse name: Not on file  . Number of children: Not on file  . Years of education: Not on file  . Highest education level: Not on file  Occupational History  . 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 Smoking status: Never Smoker  . Smokeless tobacco: Never Used  Substance and Sexual Activity  . Alcohol use: No  . Drug use: No  . Sexual activity: Not on file  Other Topics Concern  . 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 Difficulty of Paying Living Expenses: Not on file  Food Insecurity:   . Worried About Charity fundraiser in the Last Year: Not on file  . Ran Out of Food in the Last Year: Not on file  Transportation Needs:   . Lack of Transportation (Medical): Not on file  . Lack of Transportation (Non-Medical): Not on file  Physical Activity:   . Days of Exercise per Week: Not on file  . Minutes of Exercise per Session: Not on file  Stress:   . Feeling of Stress : Not on file  Social Connections:   . Frequency of Communication with Friends and Family: Not on file  . Frequency of Social Gatherings with Friends and Family: Not on file  . Attends Religious Services: Not on file  . Active Member of Clubs or Organizations: Not on file  . Attends Archivist Meetings: Not on file  . Marital Status: Not on file  Intimate Partner Violence:   . Fear of Current or Ex-Partner: Not on file  . Emotionally Abused: Not on file  . Physically Abused: Not on file  . Sexually Abused: Not on file   Family History:  Family History  Problem Relation Age of Onset  . Diabetes Mother   . Liver disease Mother   . Diabetes Sister   . Liver disease Sister   .  Hypertension Other   . Cancer Other   . Hemochromatosis Other   . Cancer Father        Throat  . Hypertension Brother   . Colon cancer Neg Hx   . Breast cancer Neg Hx     Review of Systems: Constitutional: Doesn't report fevers, chills or abnormal weight loss Eyes: Doesn't report blurriness of vision Ears, nose, mouth, throat, and face: Doesn't report sore throat Respiratory: Doesn't report cough, dyspnea or wheezes Cardiovascular: Doesn't report palpitation, chest discomfort  Gastrointestinal:  Doesn't report nausea, constipation, diarrhea GU: Doesn't report incontinence Skin: Doesn't report skin rashes Neurological: Per HPI Musculoskeletal: Doesn't report joint pain Behavioral/Psych: Doesn't  report anxiety  Physical Exam: Vitals:   06/26/20 0942  BP: 135/84  Pulse: 63  Resp: 18  Temp: (!) 97.1 F (36.2 C)  SpO2: 100%   KPS: 90. General: Alert, cooperative, pleasant, in no acute distress Head: Normal EENT: No conjunctival injection or scleral icterus.  Lungs: Resp effort normal Cardiac: Regular rate Abdomen: Non-distended abdomen Skin: No rashes cyanosis or petechiae. Extremities: No clubbing or edema  Neurologic Exam: Mental Status: Awake, alert, attentive to examiner. Oriented to self and environment. Language is fluent with intact comprehension.  Mild motor and sensory neglect.  Cranial Nerves: Visual acuity is grossly normal. Left lower quadrantanopia. Extra-ocular movements intact. No ptosis. Face is symmetric Motor: Tone and bulk are normal. Power is 4/5 in left arm and leg. Reflexes are symmetric, no pathologic reflexes present.  Sensory: Extinguishes left leg and arm. Gait: Deferred   Labs: I have reviewed the data as listed    Component Value Date/Time   NA 138 06/25/2020 0822   K 3.8 06/25/2020 0822   CL 105 06/25/2020 0822   CO2 24 06/25/2020 0822   GLUCOSE 95 06/25/2020 0822   BUN 12 06/25/2020 0822   CREATININE 0.72 06/25/2020 0822   CALCIUM  10.5 (H) 06/25/2020 0822   PROT 6.6 06/25/2020 0822   ALBUMIN 3.8 06/25/2020 0822   AST 91 (H) 06/25/2020 0822   ALT 301 (HH) 06/25/2020 0822   ALKPHOS 59 06/25/2020 0822   BILITOT 1.5 (H) 06/25/2020 0822   GFRNONAA >60 06/25/2020 0822   GFRAA >60 06/25/2020 0822   Lab Results  Component Value Date   WBC 5.9 06/25/2020   NEUTROABS 4.9 06/25/2020   HGB 15.1 (H) 06/25/2020   HCT 43.4 06/25/2020   MCV 91.6 06/25/2020   PLT 97 (L) 06/25/2020    Assessment/Plan Glioblastoma multiforme of temporal lobe ([place]) [C71.2]   KARLYN GLASCO is clinically improved today following treated UTI and ongoing corticosteroid therapy.  Unfortunately labs demonstrate elevated liver enzymes and modest thrombocytopenia.  We recommended holding Temozolomide this week and rechecking labs in 7 days.  She should otherwise continue with intensity modulated radiation therapy. Radiation will be administered Mon-Fri over 6 weeks.  As discussed Temodar will be held this week.  Chemotherapy should be held for the following:  ANC less than 1,000  Platelets less than 100,000  LFT or creatinine greater than 2x ULN  If clinical concerns/contraindications develop  Decadron should be decreased to 65m daily if tolerated.  Repeat urinalysis unremarkable.  Labs will be repeated in 7 days given abnormalities noted today. She should return to clinic in 2 weeks for in-person review.  All questions were answered. The patient knows to call the clinic with any problems, questions or concerns. No barriers to learning were detected.  I have spent a total of 30 minutes of face-to-face and non-face-to-face time, excluding clinical staff time, preparing to see patient, ordering tests and/or medications, counseling the patient, and documenting clinical information in the electronic or other health record    ZVentura Sellers MD Medical Director of Neuro-Oncology [place]08/31/21 10:01 AM

## 2020-06-27 ENCOUNTER — Telehealth: Payer: Self-pay | Admitting: Internal Medicine

## 2020-06-27 ENCOUNTER — Ambulatory Visit
Admission: RE | Admit: 2020-06-27 | Discharge: 2020-06-27 | Disposition: A | Discharge status: Home / Self Care | Payer: 59 | Source: Ambulatory Visit | Attending: Radiation Oncology | Admitting: Radiation Oncology

## 2020-06-27 DIAGNOSIS — Z833 Family history of diabetes mellitus: Secondary | ICD-10-CM | POA: Diagnosis not present

## 2020-06-27 DIAGNOSIS — Z7952 Long term (current) use of systemic steroids: Secondary | ICD-10-CM | POA: Diagnosis not present

## 2020-06-27 DIAGNOSIS — G4733 Obstructive sleep apnea (adult) (pediatric): Secondary | ICD-10-CM | POA: Diagnosis not present

## 2020-06-27 DIAGNOSIS — C712 Malignant neoplasm of temporal lobe: Secondary | ICD-10-CM | POA: Insufficient documentation

## 2020-06-27 DIAGNOSIS — Z8249 Family history of ischemic heart disease and other diseases of the circulatory system: Secondary | ICD-10-CM | POA: Diagnosis not present

## 2020-06-27 DIAGNOSIS — Z510 Encounter for antineoplastic radiation therapy: Secondary | ICD-10-CM | POA: Diagnosis not present

## 2020-06-27 DIAGNOSIS — Z801 Family history of malignant neoplasm of trachea, bronchus and lung: Secondary | ICD-10-CM | POA: Diagnosis not present

## 2020-06-27 DIAGNOSIS — D696 Thrombocytopenia, unspecified: Secondary | ICD-10-CM | POA: Diagnosis not present

## 2020-06-27 DIAGNOSIS — Z923 Personal history of irradiation: Secondary | ICD-10-CM | POA: Diagnosis not present

## 2020-06-27 DIAGNOSIS — Z79899 Other long term (current) drug therapy: Secondary | ICD-10-CM | POA: Diagnosis not present

## 2020-06-27 DIAGNOSIS — G40909 Epilepsy, unspecified, not intractable, without status epilepticus: Secondary | ICD-10-CM | POA: Diagnosis not present

## 2020-06-27 LAB — URINE CULTURE: Culture: NO GROWTH

## 2020-06-27 NOTE — Telephone Encounter (Signed)
Scheduled per 8/31 los. Unable to reach pt. Left voicemail with appt time and date.

## 2020-06-28 ENCOUNTER — Ambulatory Visit
Admission: RE | Admit: 2020-06-28 | Discharge: 2020-06-28 | Disposition: A | Discharge status: Home / Self Care | Payer: 59 | Source: Ambulatory Visit | Attending: Radiation Oncology | Admitting: Radiation Oncology

## 2020-06-28 ENCOUNTER — Ambulatory Visit: Payer: 59

## 2020-06-28 ENCOUNTER — Other Ambulatory Visit: Payer: Self-pay

## 2020-06-28 DIAGNOSIS — Z79899 Other long term (current) drug therapy: Secondary | ICD-10-CM | POA: Diagnosis not present

## 2020-06-28 DIAGNOSIS — G40909 Epilepsy, unspecified, not intractable, without status epilepticus: Secondary | ICD-10-CM | POA: Diagnosis not present

## 2020-06-28 DIAGNOSIS — R414 Neurologic neglect syndrome: Secondary | ICD-10-CM | POA: Diagnosis not present

## 2020-06-28 DIAGNOSIS — K7581 Nonalcoholic steatohepatitis (NASH): Secondary | ICD-10-CM | POA: Diagnosis not present

## 2020-06-28 DIAGNOSIS — F4321 Adjustment disorder with depressed mood: Secondary | ICD-10-CM | POA: Diagnosis not present

## 2020-06-28 DIAGNOSIS — M1990 Unspecified osteoarthritis, unspecified site: Secondary | ICD-10-CM | POA: Diagnosis not present

## 2020-06-28 DIAGNOSIS — D696 Thrombocytopenia, unspecified: Secondary | ICD-10-CM | POA: Diagnosis not present

## 2020-06-28 DIAGNOSIS — Z801 Family history of malignant neoplasm of trachea, bronchus and lung: Secondary | ICD-10-CM | POA: Diagnosis not present

## 2020-06-28 DIAGNOSIS — C712 Malignant neoplasm of temporal lobe: Secondary | ICD-10-CM | POA: Diagnosis not present

## 2020-06-28 DIAGNOSIS — Z7952 Long term (current) use of systemic steroids: Secondary | ICD-10-CM | POA: Diagnosis not present

## 2020-06-28 DIAGNOSIS — Z8249 Family history of ischemic heart disease and other diseases of the circulatory system: Secondary | ICD-10-CM | POA: Diagnosis not present

## 2020-06-28 DIAGNOSIS — Z923 Personal history of irradiation: Secondary | ICD-10-CM | POA: Diagnosis not present

## 2020-06-28 DIAGNOSIS — Z483 Aftercare following surgery for neoplasm: Secondary | ICD-10-CM | POA: Diagnosis not present

## 2020-06-28 DIAGNOSIS — I8390 Asymptomatic varicose veins of unspecified lower extremity: Secondary | ICD-10-CM | POA: Diagnosis not present

## 2020-06-28 DIAGNOSIS — G8194 Hemiplegia, unspecified affecting left nondominant side: Secondary | ICD-10-CM | POA: Diagnosis not present

## 2020-06-28 DIAGNOSIS — G4733 Obstructive sleep apnea (adult) (pediatric): Secondary | ICD-10-CM | POA: Diagnosis not present

## 2020-06-29 ENCOUNTER — Other Ambulatory Visit: Payer: Self-pay

## 2020-06-29 ENCOUNTER — Ambulatory Visit
Admission: RE | Admit: 2020-06-29 | Discharge: 2020-06-29 | Disposition: A | Discharge status: Home / Self Care | Payer: 59 | Source: Ambulatory Visit | Attending: Radiation Oncology | Admitting: Radiation Oncology

## 2020-06-29 DIAGNOSIS — D696 Thrombocytopenia, unspecified: Secondary | ICD-10-CM | POA: Diagnosis not present

## 2020-06-29 DIAGNOSIS — Z8249 Family history of ischemic heart disease and other diseases of the circulatory system: Secondary | ICD-10-CM | POA: Diagnosis not present

## 2020-06-29 DIAGNOSIS — Z923 Personal history of irradiation: Secondary | ICD-10-CM | POA: Diagnosis not present

## 2020-06-29 DIAGNOSIS — G40909 Epilepsy, unspecified, not intractable, without status epilepticus: Secondary | ICD-10-CM | POA: Diagnosis not present

## 2020-06-29 DIAGNOSIS — Z79899 Other long term (current) drug therapy: Secondary | ICD-10-CM | POA: Diagnosis not present

## 2020-06-29 DIAGNOSIS — Z801 Family history of malignant neoplasm of trachea, bronchus and lung: Secondary | ICD-10-CM | POA: Diagnosis not present

## 2020-06-29 DIAGNOSIS — C712 Malignant neoplasm of temporal lobe: Secondary | ICD-10-CM | POA: Diagnosis not present

## 2020-06-29 DIAGNOSIS — G4733 Obstructive sleep apnea (adult) (pediatric): Secondary | ICD-10-CM | POA: Diagnosis not present

## 2020-06-29 DIAGNOSIS — Z7952 Long term (current) use of systemic steroids: Secondary | ICD-10-CM | POA: Diagnosis not present

## 2020-07-01 DIAGNOSIS — C711 Malignant neoplasm of frontal lobe: Secondary | ICD-10-CM | POA: Diagnosis not present

## 2020-07-03 ENCOUNTER — Encounter: Payer: Self-pay | Admitting: Internal Medicine

## 2020-07-03 ENCOUNTER — Inpatient Hospital Stay: Payer: 59 | Attending: Internal Medicine

## 2020-07-03 ENCOUNTER — Ambulatory Visit: Payer: 59

## 2020-07-03 ENCOUNTER — Other Ambulatory Visit: Payer: Self-pay

## 2020-07-03 ENCOUNTER — Ambulatory Visit
Admission: RE | Admit: 2020-07-03 | Discharge: 2020-07-03 | Disposition: A | Discharge status: Home / Self Care | Payer: 59 | Source: Ambulatory Visit | Attending: Radiation Oncology | Admitting: Radiation Oncology

## 2020-07-03 DIAGNOSIS — Z79899 Other long term (current) drug therapy: Secondary | ICD-10-CM | POA: Diagnosis not present

## 2020-07-03 DIAGNOSIS — G4733 Obstructive sleep apnea (adult) (pediatric): Secondary | ICD-10-CM | POA: Insufficient documentation

## 2020-07-03 DIAGNOSIS — Z801 Family history of malignant neoplasm of trachea, bronchus and lung: Secondary | ICD-10-CM | POA: Insufficient documentation

## 2020-07-03 DIAGNOSIS — G8194 Hemiplegia, unspecified affecting left nondominant side: Secondary | ICD-10-CM | POA: Diagnosis not present

## 2020-07-03 DIAGNOSIS — Z7952 Long term (current) use of systemic steroids: Secondary | ICD-10-CM | POA: Diagnosis not present

## 2020-07-03 DIAGNOSIS — M1990 Unspecified osteoarthritis, unspecified site: Secondary | ICD-10-CM | POA: Diagnosis not present

## 2020-07-03 DIAGNOSIS — D696 Thrombocytopenia, unspecified: Secondary | ICD-10-CM | POA: Insufficient documentation

## 2020-07-03 DIAGNOSIS — Z8249 Family history of ischemic heart disease and other diseases of the circulatory system: Secondary | ICD-10-CM | POA: Insufficient documentation

## 2020-07-03 DIAGNOSIS — G40909 Epilepsy, unspecified, not intractable, without status epilepticus: Secondary | ICD-10-CM | POA: Diagnosis not present

## 2020-07-03 DIAGNOSIS — I8390 Asymptomatic varicose veins of unspecified lower extremity: Secondary | ICD-10-CM | POA: Diagnosis not present

## 2020-07-03 DIAGNOSIS — R414 Neurologic neglect syndrome: Secondary | ICD-10-CM | POA: Diagnosis not present

## 2020-07-03 DIAGNOSIS — C712 Malignant neoplasm of temporal lobe: Secondary | ICD-10-CM | POA: Insufficient documentation

## 2020-07-03 DIAGNOSIS — Z833 Family history of diabetes mellitus: Secondary | ICD-10-CM | POA: Insufficient documentation

## 2020-07-03 DIAGNOSIS — F4321 Adjustment disorder with depressed mood: Secondary | ICD-10-CM | POA: Diagnosis not present

## 2020-07-03 DIAGNOSIS — Z483 Aftercare following surgery for neoplasm: Secondary | ICD-10-CM | POA: Diagnosis not present

## 2020-07-03 DIAGNOSIS — K7581 Nonalcoholic steatohepatitis (NASH): Secondary | ICD-10-CM | POA: Diagnosis not present

## 2020-07-03 DIAGNOSIS — Z923 Personal history of irradiation: Secondary | ICD-10-CM | POA: Insufficient documentation

## 2020-07-03 DIAGNOSIS — Z510 Encounter for antineoplastic radiation therapy: Secondary | ICD-10-CM | POA: Insufficient documentation

## 2020-07-03 LAB — CBC WITH DIFFERENTIAL (CANCER CENTER ONLY)
Abs Immature Granulocytes: 0.02 10*3/uL (ref 0.00–0.07)
Basophils Absolute: 0 10*3/uL (ref 0.0–0.1)
Basophils Relative: 0 %
Eosinophils Absolute: 0 10*3/uL (ref 0.0–0.5)
Eosinophils Relative: 1 %
HCT: 41.3 % (ref 36.0–46.0)
Hemoglobin: 14.1 g/dL (ref 12.0–15.0)
Immature Granulocytes: 1 %
Lymphocytes Relative: 7 %
Lymphs Abs: 0.3 10*3/uL — ABNORMAL LOW (ref 0.7–4.0)
MCH: 32 pg (ref 26.0–34.0)
MCHC: 34.1 g/dL (ref 30.0–36.0)
MCV: 93.9 fL (ref 80.0–100.0)
Monocytes Absolute: 0.3 10*3/uL (ref 0.1–1.0)
Monocytes Relative: 8 %
Neutro Abs: 3.2 10*3/uL (ref 1.7–7.7)
Neutrophils Relative %: 83 %
Platelet Count: 54 10*3/uL — ABNORMAL LOW (ref 150–400)
RBC: 4.4 MIL/uL (ref 3.87–5.11)
RDW: 14.9 % (ref 11.5–15.5)
WBC Count: 3.8 10*3/uL — ABNORMAL LOW (ref 4.0–10.5)
nRBC: 0 % (ref 0.0–0.2)

## 2020-07-03 LAB — CMP (CANCER CENTER ONLY)
ALT: 179 U/L — ABNORMAL HIGH (ref 0–44)
AST: 62 U/L — ABNORMAL HIGH (ref 15–41)
Albumin: 4 g/dL (ref 3.5–5.0)
Alkaline Phosphatase: 54 U/L (ref 38–126)
Anion gap: 7 (ref 5–15)
BUN: 12 mg/dL (ref 8–23)
CO2: 25 mmol/L (ref 22–32)
Calcium: 9.7 mg/dL (ref 8.9–10.3)
Chloride: 107 mmol/L (ref 98–111)
Creatinine: 0.68 mg/dL (ref 0.44–1.00)
GFR, Est AFR Am: >60 mL/min (ref >60)
GFR, Estimated: >60 mL/min (ref >60)
Glucose, Bld: 102 mg/dL — ABNORMAL HIGH (ref 70–99)
Potassium: 3.6 mmol/L (ref 3.5–5.1)
Sodium: 139 mmol/L (ref 135–145)
Total Bilirubin: 1.7 mg/dL — ABNORMAL HIGH (ref 0.3–1.2)
Total Protein: 6.8 g/dL (ref 6.5–8.1)

## 2020-07-04 ENCOUNTER — Other Ambulatory Visit: Payer: Self-pay

## 2020-07-04 ENCOUNTER — Ambulatory Visit
Admission: RE | Admit: 2020-07-04 | Discharge: 2020-07-04 | Disposition: A | Discharge status: Home / Self Care | Payer: 59 | Source: Ambulatory Visit | Attending: Radiation Oncology | Admitting: Radiation Oncology

## 2020-07-04 DIAGNOSIS — G4733 Obstructive sleep apnea (adult) (pediatric): Secondary | ICD-10-CM | POA: Diagnosis not present

## 2020-07-04 DIAGNOSIS — I8390 Asymptomatic varicose veins of unspecified lower extremity: Secondary | ICD-10-CM | POA: Diagnosis not present

## 2020-07-04 DIAGNOSIS — K7581 Nonalcoholic steatohepatitis (NASH): Secondary | ICD-10-CM | POA: Diagnosis not present

## 2020-07-04 DIAGNOSIS — Z8249 Family history of ischemic heart disease and other diseases of the circulatory system: Secondary | ICD-10-CM | POA: Diagnosis not present

## 2020-07-04 DIAGNOSIS — Z801 Family history of malignant neoplasm of trachea, bronchus and lung: Secondary | ICD-10-CM | POA: Diagnosis not present

## 2020-07-04 DIAGNOSIS — Z483 Aftercare following surgery for neoplasm: Secondary | ICD-10-CM | POA: Diagnosis not present

## 2020-07-04 DIAGNOSIS — Z923 Personal history of irradiation: Secondary | ICD-10-CM | POA: Diagnosis not present

## 2020-07-04 DIAGNOSIS — M1990 Unspecified osteoarthritis, unspecified site: Secondary | ICD-10-CM | POA: Diagnosis not present

## 2020-07-04 DIAGNOSIS — R414 Neurologic neglect syndrome: Secondary | ICD-10-CM | POA: Diagnosis not present

## 2020-07-04 DIAGNOSIS — G8194 Hemiplegia, unspecified affecting left nondominant side: Secondary | ICD-10-CM | POA: Diagnosis not present

## 2020-07-04 DIAGNOSIS — Z79899 Other long term (current) drug therapy: Secondary | ICD-10-CM | POA: Diagnosis not present

## 2020-07-04 DIAGNOSIS — G40909 Epilepsy, unspecified, not intractable, without status epilepticus: Secondary | ICD-10-CM | POA: Diagnosis not present

## 2020-07-04 DIAGNOSIS — F4321 Adjustment disorder with depressed mood: Secondary | ICD-10-CM | POA: Diagnosis not present

## 2020-07-04 DIAGNOSIS — D696 Thrombocytopenia, unspecified: Secondary | ICD-10-CM | POA: Diagnosis not present

## 2020-07-04 DIAGNOSIS — Z7952 Long term (current) use of systemic steroids: Secondary | ICD-10-CM | POA: Diagnosis not present

## 2020-07-04 DIAGNOSIS — C712 Malignant neoplasm of temporal lobe: Secondary | ICD-10-CM | POA: Diagnosis not present

## 2020-07-05 ENCOUNTER — Telehealth: Payer: Self-pay | Admitting: *Deleted

## 2020-07-05 ENCOUNTER — Ambulatory Visit: Payer: 59 | Admitting: Physical Therapy

## 2020-07-05 ENCOUNTER — Ambulatory Visit
Admission: RE | Admit: 2020-07-05 | Discharge: 2020-07-05 | Disposition: A | Discharge status: Home / Self Care | Payer: 59 | Source: Ambulatory Visit | Attending: Radiation Oncology | Admitting: Radiation Oncology

## 2020-07-05 ENCOUNTER — Other Ambulatory Visit: Payer: Self-pay

## 2020-07-05 DIAGNOSIS — I8390 Asymptomatic varicose veins of unspecified lower extremity: Secondary | ICD-10-CM | POA: Diagnosis not present

## 2020-07-05 DIAGNOSIS — G40909 Epilepsy, unspecified, not intractable, without status epilepticus: Secondary | ICD-10-CM | POA: Diagnosis not present

## 2020-07-05 DIAGNOSIS — K7581 Nonalcoholic steatohepatitis (NASH): Secondary | ICD-10-CM | POA: Diagnosis not present

## 2020-07-05 DIAGNOSIS — G8194 Hemiplegia, unspecified affecting left nondominant side: Secondary | ICD-10-CM | POA: Diagnosis not present

## 2020-07-05 DIAGNOSIS — Z79899 Other long term (current) drug therapy: Secondary | ICD-10-CM | POA: Diagnosis not present

## 2020-07-05 DIAGNOSIS — G4733 Obstructive sleep apnea (adult) (pediatric): Secondary | ICD-10-CM | POA: Diagnosis not present

## 2020-07-05 DIAGNOSIS — Z923 Personal history of irradiation: Secondary | ICD-10-CM | POA: Diagnosis not present

## 2020-07-05 DIAGNOSIS — Z8249 Family history of ischemic heart disease and other diseases of the circulatory system: Secondary | ICD-10-CM | POA: Diagnosis not present

## 2020-07-05 DIAGNOSIS — F4321 Adjustment disorder with depressed mood: Secondary | ICD-10-CM | POA: Diagnosis not present

## 2020-07-05 DIAGNOSIS — C712 Malignant neoplasm of temporal lobe: Secondary | ICD-10-CM | POA: Diagnosis not present

## 2020-07-05 DIAGNOSIS — Z483 Aftercare following surgery for neoplasm: Secondary | ICD-10-CM | POA: Diagnosis not present

## 2020-07-05 DIAGNOSIS — Z801 Family history of malignant neoplasm of trachea, bronchus and lung: Secondary | ICD-10-CM | POA: Diagnosis not present

## 2020-07-05 DIAGNOSIS — Z7952 Long term (current) use of systemic steroids: Secondary | ICD-10-CM | POA: Diagnosis not present

## 2020-07-05 DIAGNOSIS — D696 Thrombocytopenia, unspecified: Secondary | ICD-10-CM | POA: Diagnosis not present

## 2020-07-05 DIAGNOSIS — R414 Neurologic neglect syndrome: Secondary | ICD-10-CM | POA: Diagnosis not present

## 2020-07-05 DIAGNOSIS — M1990 Unspecified osteoarthritis, unspecified site: Secondary | ICD-10-CM | POA: Diagnosis not present

## 2020-07-05 NOTE — Telephone Encounter (Signed)
Spoke with patient to relay latest labs showing improvement of ALT/AST.  Per Dr. Renda Rolls guidance instructed patient to continue to hold Temodar for now.  No other changes at this time.

## 2020-07-06 ENCOUNTER — Emergency Department (HOSPITAL_COMMUNITY)
Admission: EM | Admit: 2020-07-06 | Discharge: 2020-07-06 | Disposition: A | Discharge status: Home / Self Care | Payer: 59 | Attending: Emergency Medicine | Admitting: Emergency Medicine

## 2020-07-06 ENCOUNTER — Encounter (HOSPITAL_COMMUNITY): Payer: Self-pay | Admitting: *Deleted

## 2020-07-06 ENCOUNTER — Ambulatory Visit
Admission: RE | Admit: 2020-07-06 | Discharge: 2020-07-06 | Disposition: A | Discharge status: Home / Self Care | Payer: 59 | Source: Ambulatory Visit | Attending: Radiation Oncology | Admitting: Radiation Oncology

## 2020-07-06 ENCOUNTER — Emergency Department (HOSPITAL_COMMUNITY): Payer: 59

## 2020-07-06 ENCOUNTER — Other Ambulatory Visit: Payer: Self-pay

## 2020-07-06 DIAGNOSIS — Z79899 Other long term (current) drug therapy: Secondary | ICD-10-CM | POA: Diagnosis not present

## 2020-07-06 DIAGNOSIS — G9389 Other specified disorders of brain: Secondary | ICD-10-CM | POA: Diagnosis not present

## 2020-07-06 DIAGNOSIS — R000 Tachycardia, unspecified: Secondary | ICD-10-CM | POA: Diagnosis not present

## 2020-07-06 DIAGNOSIS — G936 Cerebral edema: Secondary | ICD-10-CM | POA: Diagnosis not present

## 2020-07-06 DIAGNOSIS — R531 Weakness: Secondary | ICD-10-CM | POA: Diagnosis not present

## 2020-07-06 DIAGNOSIS — R569 Unspecified convulsions: Secondary | ICD-10-CM | POA: Insufficient documentation

## 2020-07-06 DIAGNOSIS — R251 Tremor, unspecified: Secondary | ICD-10-CM | POA: Diagnosis not present

## 2020-07-06 DIAGNOSIS — C712 Malignant neoplasm of temporal lobe: Secondary | ICD-10-CM | POA: Diagnosis not present

## 2020-07-06 LAB — BASIC METABOLIC PANEL
Anion gap: 14 (ref 5–15)
BUN: 15 mg/dL (ref 8–23)
CO2: 21 mmol/L — ABNORMAL LOW (ref 22–32)
Calcium: 9.4 mg/dL (ref 8.9–10.3)
Chloride: 102 mmol/L (ref 98–111)
Creatinine, Ser: 0.74 mg/dL (ref 0.44–1.00)
GFR calc Af Amer: >60 mL/min (ref >60)
GFR calc non Af Amer: >60 mL/min (ref >60)
Glucose, Bld: 161 mg/dL — ABNORMAL HIGH (ref 70–99)
Potassium: 3.4 mmol/L — ABNORMAL LOW (ref 3.5–5.1)
Sodium: 137 mmol/L (ref 135–145)

## 2020-07-06 LAB — CBC
HCT: 40.5 % (ref 36.0–46.0)
Hemoglobin: 14.1 g/dL (ref 12.0–15.0)
MCH: 33.2 pg (ref 26.0–34.0)
MCHC: 34.8 g/dL (ref 30.0–36.0)
MCV: 95.3 fL (ref 80.0–100.0)
Platelets: 65 10*3/uL — ABNORMAL LOW (ref 150–400)
RBC: 4.25 MIL/uL (ref 3.87–5.11)
RDW: 14.6 % (ref 11.5–15.5)
WBC: 3 10*3/uL — ABNORMAL LOW (ref 4.0–10.5)
nRBC: 0 % (ref 0.0–0.2)

## 2020-07-06 MED ORDER — LEVETIRACETAM 500 MG PO TABS: 500.0000 mg | ORAL_TABLET | Freq: Two times a day (BID) | ORAL | 0 refills | Status: DC

## 2020-07-06 MED ORDER — DEXAMETHASONE 4 MG PO TABS: 4.0000 mg | ORAL_TABLET | Freq: Every day | ORAL | 0 refills | Status: DC

## 2020-07-06 MED ORDER — DEXAMETHASONE SODIUM PHOSPHATE 10 MG/ML IJ SOLN
10.0000 mg | Freq: Once | INTRAMUSCULAR | Status: AC
  Administered 2020-07-06: 10 mg via INTRAVENOUS
  Filled 2020-07-06: qty 1

## 2020-07-06 MED ORDER — LEVETIRACETAM IN NACL 1000 MG/100ML IV SOLN
1000.0000 mg | Freq: Once | INTRAVENOUS | Status: AC
  Administered 2020-07-06: 1000 mg via INTRAVENOUS
  Filled 2020-07-06: qty 100

## 2020-07-06 NOTE — ED Notes (Signed)
Patient transported to CT 

## 2020-07-06 NOTE — ED Provider Notes (Signed)
[facility] DEPT Provider Note   CSN: [id number] Arrival date & time: 07/06/20  1906     History Chief Complaint  Patient presents with  . Seizures    Terry Bell is a 64 y.o. female.  HPI   Patient presented to the ED today for evaluation of a seizure. Patient has a history of glioblastoma. She is status post surgery and is currently getting radiation treatments. Patient did have a radiation treatment today. Patient was at home this evening when she started noticing that her hand was shaking. Started to move up to her to her arm. Patient then started to feel her entire left side shaking. Patient is not sure if she lost consciousness after that. Her husband did call 911. 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 She has not had any seizures previously. Patient had been on prophylactic seizure medications but is not currently on any. She denies any fevers or chills. No headache. No vomiting or diarrhea.  Past Medical History:  Diagnosis Date  . Arthritis   . Chronic nonalcoholic liver disease   . Complication of anesthesia   . Continuous leakage of urine   . Depression    situational  . Diverticulosis   . OSA (obstructive sleep apnea) 07/22/2016  . PONV (postoperative nausea and vomiting)   . Steatohepatitis   . Tinnitus   . Varicose veins     Patient Active Problem List   Diagnosis Date Noted  . Brain tumor, glioma ([place]) 04/19/2020  . Glioblastoma multiforme of temporal lobe ([place]) 04/13/2020  . Headache 04/13/2020  . Osteopenia 11/09/2018  . OSA (obstructive sleep apnea) 07/22/2016  . NASH (nonalcoholic steatohepatitis) 07/18/2016  . Fibrosis of liver 12/20/2015  . ACNE ROSACEA 01/01/2010  . 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 OSTEOARTHRITIS 12/16/2007  . VARICOSE VEIN, LWR EXTREMITIES W/INFLAMMATION 07/07/2007    Past Surgical History:  Procedure Laterality Date  . APPLICATION OF CRANIAL NAVIGATION N/A  04/19/2020   Procedure: APPLICATION OF CRANIAL NAVIGATION;  Surgeon: Ashok Pall, MD;  Location: [facility];  Service: Neurosurgery;  Laterality: N/A;  . arthroscopic left knee sugery    . bilateral bunionectomy    . BREAST BIOPSY Left   . CHOLECYSTECTOMY    . colonsocopy    . CRANIOTOMY Right 04/19/2020   Procedure: Right Temporal craniotomy for tumor resection;  Surgeon: Ashok Pall, MD;  Location: [place] [facility];  Service: Neurosurgery;  Laterality: Right;  . debridement and removal and lateral release of torn cartlidge in rt knee    . gum grating    . rt knee arthroscopy    . torn cartledge in rt knee       OB History   No obstetric history on file.     Family History  Problem Relation Age of Onset  . Diabetes Mother   . Liver disease Mother   . Diabetes Sister   . Liver disease Sister   . Hypertension Other   . Cancer Other   . Hemochromatosis Other   . Cancer Father        Throat  . Hypertension Brother   . Colon cancer Neg Hx   . Breast cancer Neg Hx     Social History   Tobacco Use  . Smoking status: Never Smoker  . Smokeless tobacco: Never Used  Substance Use Topics  . Alcohol use: No  . Drug use: No    Home Medications Prior to Admission medications  Medication Sig Start Date End Date Taking? Authorizing Provider  Cholecalciferol (VITAMIN D) 1000 UNITS capsule Take 1,000 Units by mouth 2 (two) times daily.      [provider]  dexamethasone (DECADRON) 4 MG tablet Take 1 tablet (4 mg total) by mouth daily. 07/06/20   Dorie Rank, MD  levETIRAcetam (KEPPRA) 500 MG tablet Take 1 tablet (500 mg total) by mouth 2 (two) times daily. 07/06/20   Dorie Rank, MD  LORazepam (ATIVAN) 1 MG tablet Take 1 tablet (1 mg total) by mouth every 8 (eight) hours. 06/01/20   Kyung Rudd, MD  ondansetron (ZOFRAN) 8 MG tablet Take 1 tablet (8 mg total) by mouth 2 (two) times daily as needed (nausea and vomiting). May take 30-60 minutes prior to Temodar administration if  nausea/vomiting occurs. 05/17/20   Ventura Sellers, MD  Probiotic Product (ALIGN) 4 MG CAPS Take 4 mg by mouth daily.     [provider]  temozolomide (TEMODAR) 140 MG capsule Take 1 capsule (140 mg total) by mouth daily. May take on an empty stomach to decrease nausea & vomiting. 05/17/20   Ventura Sellers, MD    Allergies    Niacin  Review of Systems   Review of Systems  All other systems reviewed and are negative.   Physical Exam Updated Vital Signs BP 109/80   Pulse 77   Temp 98.1 F (36.7 C) (Oral)   Resp 15   Ht 1.676 m (5' 6" )   Wt 76.2 kg   SpO2 98%   BMI 27.12 kg/m   Physical Exam Vitals and nursing note reviewed.  Constitutional:      General: She is not in acute distress.    Appearance: She is well-developed.  HENT:     Head: Normocephalic and atraumatic.     Right Ear: External ear normal.     Left Ear: External ear normal.  Eyes:     General: No scleral icterus.       Right eye: No discharge.        Left eye: No discharge.     Conjunctiva/sclera: Conjunctivae normal.  Neck:     Trachea: No tracheal deviation.  Cardiovascular:     Rate and Rhythm: Normal rate and regular rhythm.  Pulmonary:     Effort: Pulmonary effort is normal. No respiratory distress.     Breath sounds: Normal breath sounds. No stridor. No wheezing or rales.  Abdominal:     General: Bowel sounds are normal. There is no distension.     Palpations: Abdomen is soft.     Tenderness: There is no abdominal tenderness. There is no guarding or rebound.  Musculoskeletal:        General: No tenderness.     Cervical back: Neck supple.  Skin:    General: Skin is warm and dry.     Findings: No rash.  Neurological:     Mental Status: She is alert.     Cranial Nerves: No cranial nerve deficit (no facial droop, extraocular movements intact, no slurred speech).     Sensory: No sensory deficit.     Motor: No abnormal muscle tone or seizure activity.     Coordination: Coordination  normal.     ED Results / Procedures / Treatments   Labs (all labs ordered are listed, but only abnormal results are displayed) Labs Reviewed  BASIC METABOLIC PANEL - Abnormal; Notable for the following components:      Result Value   Potassium 3.4 (*)  CO2 21 (*)    Glucose, Bld 161 (*)    All other components within normal limits  CBC - Abnormal; Notable for the following components:   WBC 3.0 (*)    Platelets 65 (*)    All other components within normal limits    EKG None  Radiology CT Head Wo Contrast  Result Date: 07/06/2020 CLINICAL DATA:  Upper extremity tremors, seizure, history of right temporal lobe mass status post resection EXAM: CT HEAD WITHOUT CONTRAST TECHNIQUE: Contiguous axial images were obtained from the base of the skull through the vertex without intravenous contrast. COMPARISON:  04/18/2020 04/20/2020 FINDINGS: Brain: Postsurgical changes from previous right temporal craniotomy and mass resection. Persistent cystic area within the surgical bed measuring 2.1 x 1.9 cm, with adjacent coarse calcification. There is diffuse edema throughout the periventricular and subcortical white matter involving the right temporal, occipital, and parietal lobes. This may reflect a combination of vasogenic edema and post radiation change. Contrasted MRI would be needed to assess for residual or recurrent disease. There is no evidence of acute hemorrhage. Slight midline shift to the left, unchanged since previous MRI 04/20/2020. No acute extra-axial fluid collections. Vascular: No hyperdense vessel or unexpected calcification. Skull: Postsurgical changes from right temporal craniotomy. No acute bony abnormality. Sinuses/Orbits: No acute finding. Other: None. IMPRESSION: 1. Postsurgical changes from right temporal craniotomy and previous mass resection. Cystic area within the surgical bed could be related to residual/recurrent disease or postsurgical change. Contrasted MRI would be needed  to assess for residual or recurrent disease. 2. Increased hypodensity throughout the periventricular and subcortical white matter throughout the right temporal, parietal, and occipital regions. This is likely a combination of vasogenic edema and post therapeutic change. Again, MRI would be useful. 3. No evidence of acute infarct or hemorrhage. Electronically Signed   By: Randa Ngo M.D.   On: 07/06/2020 20:18    Procedures Procedures (including critical care time)  Medications Ordered in ED Medications  levETIRAcetam (KEPPRA) IVPB 1000 mg/100 mL premix (0 mg Intravenous Stopped 07/06/20 2013)  dexamethasone (DECADRON) injection 10 mg (10 mg Intravenous Given 07/06/20 2135)    ED Course  I have reviewed the triage vital signs and the nursing notes.  Pertinent labs & imaging results that were available during my care of the patient were reviewed by me and considered in my medical decision making (see chart for details).  Clinical Course as of Jul 06 2248  Fri Jul 06, 2020  2049 CT scan shows findings consistent with some vasogenic edema and postsurgical changes.  MRI would be helpful in follow up   [JK]  2113 DIscussed with Dr Mickeal Skinner.  Decadron 4 po daily.  Keppra 500 mg PO BID   [JK]  2135 WBC decreased otherwise cbc normal   [JK]  2248 Patient was monitored in the ED.  No further seizure activity   [JK]    Clinical Course User Index [JK] Dorie Rank, MD   MDM Rules/Calculators/A&P                          Patient presented to the ED for evaluation of new onset seizure.  Patient does have a history of known glioblastoma.  She is status post resection.  Patient CT scan does show some evidence of a mild edema and postoperative changes.  I discussed the case with Dr. Mickeal Skinner.  No further imaging is necessary tonight.  Patient has been given a loading dose of Keppra.  She was also given 10 mg of Decadron IV.  I will discharge her home on 4 mg of Decadron daily and Keppra 500 mg p.o.  twice daily.  Patient will follow up with Dr. Mickeal Skinner as scheduled. Final Clinical Impression(s) / ED Diagnoses Final diagnoses:  Seizure [facility])    Rx / DC Orders ED Discharge Orders         Ordered    dexamethasone (DECADRON) 4 MG tablet  Daily        07/06/20 2218    levETIRAcetam (KEPPRA) 500 MG tablet  2 times daily        07/06/20 2218           Dorie Rank, MD 07/06/20 2249

## 2020-07-06 NOTE — ED Notes (Signed)
ED Provider at bedside. 

## 2020-07-06 NOTE — ED Triage Notes (Signed)
Pt arrives via [facility] from home. Per report, pt was sitting outside when left hand and arm started to tremor. Progressed to right arm, and to upper body. Became unresponsive, lasting approx 5 minutes per spouse. Post ictal- confused for several minutes. Pt was sitting in chair, no fall. No hx of seizures. Hx of brain cancer, rec'ing, chemo and radiation. IV established in the left ac. Left sided neglect, and left sided weakness is baseline for patient. A/O en route. 130/98, hr 100, 16 RR, 97%, 148 CBG.

## 2020-07-06 NOTE — ED Triage Notes (Signed)
Pt states she has felt normal today. Today was last radiation, Aug 31 was last oral Chemo. Headache yesterday, relieved by Tylenol, says she does not normally get headaches.

## 2020-07-09 ENCOUNTER — Ambulatory Visit
Admission: RE | Admit: 2020-07-09 | Discharge: 2020-07-09 | Disposition: A | Discharge status: Home / Self Care | Payer: 59 | Source: Ambulatory Visit | Attending: Radiation Oncology | Admitting: Radiation Oncology

## 2020-07-09 ENCOUNTER — Encounter: Payer: Self-pay | Admitting: Radiation Oncology

## 2020-07-09 ENCOUNTER — Inpatient Hospital Stay: Payer: 59

## 2020-07-09 ENCOUNTER — Other Ambulatory Visit: Payer: Self-pay

## 2020-07-09 ENCOUNTER — Inpatient Hospital Stay (HOSPITAL_BASED_OUTPATIENT_CLINIC_OR_DEPARTMENT_OTHER): Payer: 59 | Admitting: Internal Medicine

## 2020-07-09 VITALS — BP 123/80 | HR 63 | Temp 96.2°F | Resp 18 | Ht 66.0 in | Wt 160.6 lb

## 2020-07-09 DIAGNOSIS — Z79899 Other long term (current) drug therapy: Secondary | ICD-10-CM | POA: Diagnosis not present

## 2020-07-09 DIAGNOSIS — Z8249 Family history of ischemic heart disease and other diseases of the circulatory system: Secondary | ICD-10-CM | POA: Diagnosis not present

## 2020-07-09 DIAGNOSIS — Z923 Personal history of irradiation: Secondary | ICD-10-CM | POA: Diagnosis not present

## 2020-07-09 DIAGNOSIS — C712 Malignant neoplasm of temporal lobe: Secondary | ICD-10-CM | POA: Diagnosis not present

## 2020-07-09 DIAGNOSIS — G4733 Obstructive sleep apnea (adult) (pediatric): Secondary | ICD-10-CM | POA: Diagnosis not present

## 2020-07-09 DIAGNOSIS — G40909 Epilepsy, unspecified, not intractable, without status epilepticus: Secondary | ICD-10-CM | POA: Diagnosis not present

## 2020-07-09 DIAGNOSIS — Z7952 Long term (current) use of systemic steroids: Secondary | ICD-10-CM | POA: Diagnosis not present

## 2020-07-09 DIAGNOSIS — D696 Thrombocytopenia, unspecified: Secondary | ICD-10-CM | POA: Diagnosis not present

## 2020-07-09 DIAGNOSIS — Z801 Family history of malignant neoplasm of trachea, bronchus and lung: Secondary | ICD-10-CM | POA: Diagnosis not present

## 2020-07-09 LAB — CBC WITH DIFFERENTIAL (CANCER CENTER ONLY)
Abs Immature Granulocytes: 0.01 10*3/uL (ref 0.00–0.07)
Basophils Absolute: 0 10*3/uL (ref 0.0–0.1)
Basophils Relative: 0 %
Eosinophils Absolute: 0 10*3/uL (ref 0.0–0.5)
Eosinophils Relative: 0 %
HCT: 41.6 % (ref 36.0–46.0)
Hemoglobin: 14.4 g/dL (ref 12.0–15.0)
Immature Granulocytes: 0 %
Lymphocytes Relative: 4 %
Lymphs Abs: 0.2 10*3/uL — ABNORMAL LOW (ref 0.7–4.0)
MCH: 33 pg (ref 26.0–34.0)
MCHC: 34.6 g/dL (ref 30.0–36.0)
MCV: 95.4 fL (ref 80.0–100.0)
Monocytes Absolute: 0.4 10*3/uL (ref 0.1–1.0)
Monocytes Relative: 7 %
Neutro Abs: 4.2 10*3/uL (ref 1.7–7.7)
Neutrophils Relative %: 89 %
Platelet Count: 74 10*3/uL — ABNORMAL LOW (ref 150–400)
RBC: 4.36 MIL/uL (ref 3.87–5.11)
RDW: 15.1 % (ref 11.5–15.5)
WBC Count: 4.7 10*3/uL (ref 4.0–10.5)
nRBC: 0 % (ref 0.0–0.2)

## 2020-07-09 LAB — CMP (CANCER CENTER ONLY)
ALT: 136 U/L — ABNORMAL HIGH (ref 0–44)
AST: 50 U/L — ABNORMAL HIGH (ref 15–41)
Albumin: 4 g/dL (ref 3.5–5.0)
Alkaline Phosphatase: 51 U/L (ref 38–126)
Anion gap: 6 (ref 5–15)
BUN: 15 mg/dL (ref 8–23)
CO2: 27 mmol/L (ref 22–32)
Calcium: 9.9 mg/dL (ref 8.9–10.3)
Chloride: 108 mmol/L (ref 98–111)
Creatinine: 0.71 mg/dL (ref 0.44–1.00)
GFR, Est AFR Am: >60 mL/min (ref >60)
GFR, Estimated: >60 mL/min (ref >60)
Glucose, Bld: 94 mg/dL (ref 70–99)
Potassium: 3.9 mmol/L (ref 3.5–5.1)
Sodium: 141 mmol/L (ref 135–145)
Total Bilirubin: 1.3 mg/dL — ABNORMAL HIGH (ref 0.3–1.2)
Total Protein: 6.7 g/dL (ref 6.5–8.1)

## 2020-07-09 MED ORDER — DEXAMETHASONE 1 MG PO TABS: 1.0000 mg | ORAL_TABLET | Freq: Every day | ORAL | 1 refills | Status: DC

## 2020-07-09 NOTE — Progress Notes (Signed)
[facility] [place], [place] [address] [phone]  Interval Evaluation  Date of Service: 07/09/20 Patient Name: Terry Bell Patient MRN: [id number] Patient DOB: Dec 02, 1955 Provider: Ventura Sellers, MD  Identifying Statement:  Terry Bell is a 64 y.o. female with right temporal glioblastoma   Referring Provider: Martinique, Betty G, MD [place],  [place] [address]  Oncologic History: Oncology History  Glioblastoma multiforme of temporal lobe [facility])  04/19/2020 Surgery   Craniotomy, right temporal resection with Dr. Christella Noa   05/28/2020 -  Radiation Therapy   IMRT with concurrent Temodar 62m/m2     Biomarkers:  MGMT Methylated.  IDH 1/2 Wild type.  EGFR Not expressed  TERT "Mutated   Interval History:  Terry YOUNGMANpresents today, now in final week of radiation and temozolomide. No further seizures after episode this past weekend, described as "shaking of left arm, head and eye deviation to the left, lasting several mintues".  She has been taking the Keppra 5069mtwice per day.  Overall functional status is modestly improved actually, with increased walking with walker.  Decadron is currently at 38m79mer day after seizure event, she had been down to 2mg36mily. Continues to hold the Temodar.   H+P (04/26/20) Patient presented last week with several days of progressive new-onset frontal headache.  This may be have been associated with change in taste, but no other neurologic complaints.  CNS imaging demonstrated a large right temporal mass.  She underwent resection on 04/19/20 with Dr. CabbChristella Noas discharged on 6/27.  She has no complaints today aside from some residual pain at the surgical site.  Does complain of "hole in vision" on the left side, but otherwise no neurologic deficits.  Taking decadron 38mg 87mce per day.  Medications: Current Outpatient Medications on File Prior to Visit  Medication Sig  Dispense Refill  . Cholecalciferol (VITAMIN D) 1000 UNITS capsule Take 1,000 Units by mouth 2 (two) times daily.      . dexMarland Kitchenmethasone (DECADRON) 4 MG tablet Take 1 tablet (4 mg total) by mouth daily. 10 tablet 0  . levETIRAcetam (KEPPRA) 500 MG tablet Take 1 tablet (500 mg total) by mouth 2 (two) times daily. 60 tablet 0  . ondansetron (ZOFRAN) 8 MG tablet Take 1 tablet (8 mg total) by mouth 2 (two) times daily as needed (nausea and vomiting). May take 30-60 minutes prior to Temodar administration if nausea/vomiting occurs. 30 tablet 1  . Probiotic Product (ALIGN) 4 MG CAPS Take 4 mg by mouth daily.     . temMarland Kitchenzolomide (TEMODAR) 140 MG capsule Take 1 capsule (140 mg total) by mouth daily. May take on an empty stomach to decrease nausea & vomiting. 42 capsule 0  . LORazepam (ATIVAN) 1 MG tablet Take 1 tablet (1 mg total) by mouth every 8 (eight) hours. 60 tablet 0   No current facility-administered medications on file prior to visit.    Allergies:  Allergies  Allergen Reactions  . Niacin Hives and Rash   Past Medical History:  Past Medical History:  Diagnosis Date  . Arthritis   . Chronic nonalcoholic liver disease   . Complication of anesthesia   . Continuous leakage of urine   . Depression    situational  . Diverticulosis   . OSA (obstructive sleep apnea) 07/22/2016  . PONV (postoperative nausea and vomiting)   . Steatohepatitis   . Tinnitus   . Varicose veins  Past Surgical History:  Past Surgical History:  Procedure Laterality Date  . APPLICATION OF CRANIAL NAVIGATION N/A 04/19/2020   Procedure: APPLICATION OF CRANIAL NAVIGATION;  Surgeon: Ashok Pall, MD;  Location: [place];  Service: Neurosurgery;  Laterality: N/A;  . arthroscopic left knee sugery    . bilateral bunionectomy    . BREAST BIOPSY Left   . CHOLECYSTECTOMY    . colonsocopy    . CRANIOTOMY Right 04/19/2020   Procedure: Right Temporal craniotomy for tumor resection;  Surgeon: Ashok Pall, MD;  Location: [facility];  Service: Neurosurgery;  Laterality: Right;  . debridement and removal and lateral release of torn cartlidge in rt knee    . gum grating    . rt knee arthroscopy    . torn cartledge in rt knee     Social History:  Social History   Socioeconomic History  . Marital status: Married    Spouse name: Not on file  . Number of children: Not on file  . Years of education: Not on file  . Highest education level: Not on file  Occupational History  . 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 Smoking status: Never Smoker  . Smokeless tobacco: Never Used  Substance and Sexual Activity  . Alcohol use: No  . Drug use: No  . Sexual activity: Not on file  Other Topics Concern  . 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 Difficulty of Paying Living Expenses: Not on file  Food Insecurity:   . Worried About Charity fundraiser in the Last Year: Not on file  . Ran Out of Food in the Last Year: Not on file  Transportation Needs:   . Lack of Transportation (Medical): Not on file  . Lack of Transportation (Non-Medical): Not on file  Physical Activity:   . Days of Exercise per Week: Not on file  . Minutes of Exercise per Session: Not on file  Stress:   . Feeling of Stress : Not on file  Social Connections:   . Frequency of Communication with Friends and Family: Not on file  . Frequency of Social Gatherings with Friends and Family: Not on file  . Attends Religious Services: Not on file  . Active Member of Clubs or Organizations: Not on file  . Attends Archivist Meetings: Not on file  . Marital Status: Not on file  Intimate Partner Violence:   . Fear of Current or Ex-Partner: Not on file  . Emotionally Abused: Not on file  . Physically Abused: Not on file  . Sexually Abused: Not on file   Family History:  Family History  Problem  Relation Age of Onset  . Diabetes Mother   . Liver disease Mother   . Diabetes Sister   . Liver disease Sister   . Hypertension Other   . Cancer Other   . Hemochromatosis Other   . Cancer Father        Throat  . Hypertension Brother   . Colon cancer Neg Hx   . Breast cancer Neg Hx     Review of Systems: Constitutional: Doesn't report fevers, chills or abnormal weight loss Eyes: Doesn't report blurriness of vision Ears, nose, mouth, throat, and face: Doesn't report sore throat Respiratory: Doesn't report cough, dyspnea or wheezes Cardiovascular: Doesn't report  palpitation, chest discomfort  Gastrointestinal:  Doesn't report nausea, constipation, diarrhea GU: Doesn't report incontinence Skin: Doesn't report skin rashes Neurological: Per HPI Musculoskeletal: Doesn't report joint pain Behavioral/Psych: Doesn't report anxiety  Physical Exam: Vitals:   07/09/20 1007  BP: 123/80  Pulse: 63  Resp: 18  Temp: (!) 96.2 F (35.7 C)  SpO2: 100%   KPS: 90. General: Alert, cooperative, pleasant, in no acute distress Head: Normal EENT: No conjunctival injection or scleral icterus.  Lungs: Resp effort normal Cardiac: Regular rate Abdomen: Non-distended abdomen Skin: No rashes cyanosis or petechiae. Extremities: No clubbing or edema  Neurologic Exam: Mental Status: Awake, alert, attentive to examiner. Oriented to self and environment. Language is fluent with intact comprehension.  Mild motor and sensory neglect.  Cranial Nerves: Visual acuity is grossly normal. Left lower quadrantanopia. Extra-ocular movements intact. No ptosis. Face is symmetric Motor: Tone and bulk are normal. Power is 4/5 in left arm and leg. Reflexes are symmetric, no pathologic reflexes present.  Sensory: Extinguishes left leg and arm. Gait: Deferred   Labs: I have reviewed the data as listed    Component Value Date/Time   NA 137 07/06/2020 1924   K 3.4 (L) 07/06/2020 1924   CL 102 07/06/2020 1924    CO2 21 (L) 07/06/2020 1924   GLUCOSE 161 (H) 07/06/2020 1924   BUN 15 07/06/2020 1924   CREATININE 0.74 07/06/2020 1924   CREATININE 0.68 07/03/2020 0947   CALCIUM 9.4 07/06/2020 1924   PROT 6.8 07/03/2020 0947   ALBUMIN 4.0 07/03/2020 0947   AST 62 (H) 07/03/2020 0947   ALT 179 (H) 07/03/2020 0947   ALKPHOS 54 07/03/2020 0947   BILITOT 1.7 (H) 07/03/2020 0947   GFRNONAA >60 07/06/2020 1924   GFRNONAA >60 07/03/2020 0947   GFRAA >60 07/06/2020 1924   GFRAA >60 07/03/2020 0947   Lab Results  Component Value Date   WBC 4.7 07/09/2020   NEUTROABS 4.2 07/09/2020   HGB 14.4 07/09/2020   HCT 41.6 07/09/2020   MCV 95.4 07/09/2020   PLT 74 (L) 07/09/2020    Assessment/Plan Glioblastoma multiforme of temporal lobe ([place]) [C71.2]   Terry Bell is clinically stable today, following first ever seizure this past weekend.  She has otherwise completed IMRT and daily Temodar without issue.    She has MRI and protocol visit at [facility] on 07[facility]18/20.  We will wait to hear from their team regarding her eligibility for CMV trial.  Follow ups will be arranged pending her status with that protocol.  Decadron should be decreased to 13m daily x6 days, then 154mdaily thereafter.  Keppra should be continued at 50022mID.  We provided breakthrough PRN ativan 2mg38m for prolonged seizure events.  Counseled on epilepsy safety.   Liver enzymes have mostly normalized, thrombocytopenia also improving.  Will continue to follow.  All questions were answered. The patient knows to call the clinic with any problems, questions or concerns. No barriers to learning were detected.  I have spent a total of 30 minutes of face-to-face and non-face-to-face time, excluding clinical staff time, preparing to see patient, ordering tests and/or medications, counseling the patient, and documenting clinical information in the electronic or other health record    ZachVentura Sellers Medical Director of  Neuro-Oncology [facility]13/21 10:20 AM

## 2020-07-10 ENCOUNTER — Ambulatory Visit: Payer: 59 | Admitting: Physical Therapy

## 2020-07-10 DIAGNOSIS — R414 Neurologic neglect syndrome: Secondary | ICD-10-CM | POA: Diagnosis not present

## 2020-07-10 DIAGNOSIS — G8194 Hemiplegia, unspecified affecting left nondominant side: Secondary | ICD-10-CM | POA: Diagnosis not present

## 2020-07-10 DIAGNOSIS — K7581 Nonalcoholic steatohepatitis (NASH): Secondary | ICD-10-CM | POA: Diagnosis not present

## 2020-07-10 DIAGNOSIS — C712 Malignant neoplasm of temporal lobe: Secondary | ICD-10-CM | POA: Diagnosis not present

## 2020-07-10 DIAGNOSIS — I8390 Asymptomatic varicose veins of unspecified lower extremity: Secondary | ICD-10-CM | POA: Diagnosis not present

## 2020-07-10 DIAGNOSIS — M1990 Unspecified osteoarthritis, unspecified site: Secondary | ICD-10-CM | POA: Diagnosis not present

## 2020-07-10 DIAGNOSIS — F4321 Adjustment disorder with depressed mood: Secondary | ICD-10-CM | POA: Diagnosis not present

## 2020-07-10 DIAGNOSIS — Z483 Aftercare following surgery for neoplasm: Secondary | ICD-10-CM | POA: Diagnosis not present

## 2020-07-10 DIAGNOSIS — G4733 Obstructive sleep apnea (adult) (pediatric): Secondary | ICD-10-CM | POA: Diagnosis not present

## 2020-07-11 DIAGNOSIS — K7581 Nonalcoholic steatohepatitis (NASH): Secondary | ICD-10-CM | POA: Diagnosis not present

## 2020-07-11 DIAGNOSIS — G8194 Hemiplegia, unspecified affecting left nondominant side: Secondary | ICD-10-CM | POA: Diagnosis not present

## 2020-07-11 DIAGNOSIS — G4733 Obstructive sleep apnea (adult) (pediatric): Secondary | ICD-10-CM | POA: Diagnosis not present

## 2020-07-11 DIAGNOSIS — I8390 Asymptomatic varicose veins of unspecified lower extremity: Secondary | ICD-10-CM | POA: Diagnosis not present

## 2020-07-11 DIAGNOSIS — C712 Malignant neoplasm of temporal lobe: Secondary | ICD-10-CM | POA: Diagnosis not present

## 2020-07-11 DIAGNOSIS — Z483 Aftercare following surgery for neoplasm: Secondary | ICD-10-CM | POA: Diagnosis not present

## 2020-07-11 DIAGNOSIS — F4321 Adjustment disorder with depressed mood: Secondary | ICD-10-CM | POA: Diagnosis not present

## 2020-07-11 DIAGNOSIS — M1990 Unspecified osteoarthritis, unspecified site: Secondary | ICD-10-CM | POA: Diagnosis not present

## 2020-07-11 DIAGNOSIS — R414 Neurologic neglect syndrome: Secondary | ICD-10-CM | POA: Diagnosis not present

## 2020-07-12 ENCOUNTER — Ambulatory Visit: Payer: 59

## 2020-07-12 ENCOUNTER — Other Ambulatory Visit: Payer: Self-pay | Admitting: *Deleted

## 2020-07-12 DIAGNOSIS — Z483 Aftercare following surgery for neoplasm: Secondary | ICD-10-CM | POA: Diagnosis not present

## 2020-07-12 DIAGNOSIS — M1990 Unspecified osteoarthritis, unspecified site: Secondary | ICD-10-CM | POA: Diagnosis not present

## 2020-07-12 DIAGNOSIS — C712 Malignant neoplasm of temporal lobe: Secondary | ICD-10-CM | POA: Diagnosis not present

## 2020-07-12 DIAGNOSIS — K7581 Nonalcoholic steatohepatitis (NASH): Secondary | ICD-10-CM | POA: Diagnosis not present

## 2020-07-12 DIAGNOSIS — I8390 Asymptomatic varicose veins of unspecified lower extremity: Secondary | ICD-10-CM | POA: Diagnosis not present

## 2020-07-12 DIAGNOSIS — R414 Neurologic neglect syndrome: Secondary | ICD-10-CM | POA: Diagnosis not present

## 2020-07-12 DIAGNOSIS — G4733 Obstructive sleep apnea (adult) (pediatric): Secondary | ICD-10-CM | POA: Diagnosis not present

## 2020-07-12 DIAGNOSIS — G8194 Hemiplegia, unspecified affecting left nondominant side: Secondary | ICD-10-CM | POA: Diagnosis not present

## 2020-07-12 DIAGNOSIS — F4321 Adjustment disorder with depressed mood: Secondary | ICD-10-CM | POA: Diagnosis not present

## 2020-07-16 DIAGNOSIS — G8194 Hemiplegia, unspecified affecting left nondominant side: Secondary | ICD-10-CM | POA: Diagnosis not present

## 2020-07-16 DIAGNOSIS — Z483 Aftercare following surgery for neoplasm: Secondary | ICD-10-CM | POA: Diagnosis not present

## 2020-07-16 DIAGNOSIS — R414 Neurologic neglect syndrome: Secondary | ICD-10-CM | POA: Diagnosis not present

## 2020-07-16 DIAGNOSIS — F4321 Adjustment disorder with depressed mood: Secondary | ICD-10-CM | POA: Diagnosis not present

## 2020-07-16 DIAGNOSIS — M1990 Unspecified osteoarthritis, unspecified site: Secondary | ICD-10-CM | POA: Diagnosis not present

## 2020-07-16 DIAGNOSIS — C712 Malignant neoplasm of temporal lobe: Secondary | ICD-10-CM | POA: Diagnosis not present

## 2020-07-16 DIAGNOSIS — G4733 Obstructive sleep apnea (adult) (pediatric): Secondary | ICD-10-CM | POA: Diagnosis not present

## 2020-07-16 DIAGNOSIS — I8390 Asymptomatic varicose veins of unspecified lower extremity: Secondary | ICD-10-CM | POA: Diagnosis not present

## 2020-07-16 DIAGNOSIS — K7581 Nonalcoholic steatohepatitis (NASH): Secondary | ICD-10-CM | POA: Diagnosis not present

## 2020-07-17 ENCOUNTER — Telehealth: Payer: Self-pay | Admitting: *Deleted

## 2020-07-17 ENCOUNTER — Ambulatory Visit: Payer: 59 | Admitting: Physical Therapy

## 2020-07-17 DIAGNOSIS — K7581 Nonalcoholic steatohepatitis (NASH): Secondary | ICD-10-CM | POA: Diagnosis not present

## 2020-07-17 DIAGNOSIS — M1990 Unspecified osteoarthritis, unspecified site: Secondary | ICD-10-CM | POA: Diagnosis not present

## 2020-07-17 DIAGNOSIS — Z483 Aftercare following surgery for neoplasm: Secondary | ICD-10-CM | POA: Diagnosis not present

## 2020-07-17 DIAGNOSIS — G8194 Hemiplegia, unspecified affecting left nondominant side: Secondary | ICD-10-CM | POA: Diagnosis not present

## 2020-07-17 DIAGNOSIS — C712 Malignant neoplasm of temporal lobe: Secondary | ICD-10-CM | POA: Diagnosis not present

## 2020-07-17 DIAGNOSIS — I8390 Asymptomatic varicose veins of unspecified lower extremity: Secondary | ICD-10-CM | POA: Diagnosis not present

## 2020-07-17 DIAGNOSIS — R414 Neurologic neglect syndrome: Secondary | ICD-10-CM | POA: Diagnosis not present

## 2020-07-17 DIAGNOSIS — G4733 Obstructive sleep apnea (adult) (pediatric): Secondary | ICD-10-CM | POA: Diagnosis not present

## 2020-07-17 DIAGNOSIS — F4321 Adjustment disorder with depressed mood: Secondary | ICD-10-CM | POA: Diagnosis not present

## 2020-07-17 NOTE — Telephone Encounter (Signed)
Patient called after hours 07/16/2020 concern for dehydration because of dizziness and overall weakness.  She reports insecure of safety while ambulating whereas she was fine the day before prior to therapy.    Upon calling her today she states that she has intentionally pushed approximately 500 ml's of fluid before 9am today.  She reports feeling better,  Denies feeling unsafe with ambulation and denies dizziness.  She states she has been pushing self with additional PT outside of normal therapy sessions and will rest some today and continue to push fluids.  She also did decrease steroids yesterday Decadron 2 mg down to 1 mg.    Informed of [facility]/afterhours help.  She will call if she develops any further issues.

## 2020-07-18 DIAGNOSIS — C713 Malignant neoplasm of parietal lobe: Secondary | ICD-10-CM | POA: Diagnosis not present

## 2020-07-18 DIAGNOSIS — C718 Malignant neoplasm of overlapping sites of brain: Secondary | ICD-10-CM | POA: Diagnosis not present

## 2020-07-19 ENCOUNTER — Ambulatory Visit: Payer: 59 | Admitting: Physical Therapy

## 2020-07-19 DIAGNOSIS — F4321 Adjustment disorder with depressed mood: Secondary | ICD-10-CM | POA: Diagnosis not present

## 2020-07-19 DIAGNOSIS — M1990 Unspecified osteoarthritis, unspecified site: Secondary | ICD-10-CM | POA: Diagnosis not present

## 2020-07-19 DIAGNOSIS — G4733 Obstructive sleep apnea (adult) (pediatric): Secondary | ICD-10-CM | POA: Diagnosis not present

## 2020-07-19 DIAGNOSIS — K7581 Nonalcoholic steatohepatitis (NASH): Secondary | ICD-10-CM | POA: Diagnosis not present

## 2020-07-19 DIAGNOSIS — C712 Malignant neoplasm of temporal lobe: Secondary | ICD-10-CM | POA: Diagnosis not present

## 2020-07-19 DIAGNOSIS — R414 Neurologic neglect syndrome: Secondary | ICD-10-CM | POA: Diagnosis not present

## 2020-07-19 DIAGNOSIS — I8390 Asymptomatic varicose veins of unspecified lower extremity: Secondary | ICD-10-CM | POA: Diagnosis not present

## 2020-07-19 DIAGNOSIS — Z483 Aftercare following surgery for neoplasm: Secondary | ICD-10-CM | POA: Diagnosis not present

## 2020-07-19 DIAGNOSIS — G8194 Hemiplegia, unspecified affecting left nondominant side: Secondary | ICD-10-CM | POA: Diagnosis not present

## 2020-07-23 ENCOUNTER — Other Ambulatory Visit: Payer: Self-pay | Admitting: Radiation Oncology

## 2020-07-23 ENCOUNTER — Ambulatory Visit
Admission: RE | Admit: 2020-07-23 | Discharge: 2020-07-23 | Disposition: A | Discharge status: Home / Self Care | Payer: Self-pay | Source: Ambulatory Visit | Attending: Radiation Oncology | Admitting: Radiation Oncology

## 2020-07-23 ENCOUNTER — Inpatient Hospital Stay: Payer: 59

## 2020-07-23 DIAGNOSIS — C719 Malignant neoplasm of brain, unspecified: Secondary | ICD-10-CM

## 2020-07-24 ENCOUNTER — Telehealth: Payer: Self-pay | Admitting: *Deleted

## 2020-07-24 ENCOUNTER — Encounter: Payer: Self-pay | Admitting: Internal Medicine

## 2020-07-24 ENCOUNTER — Ambulatory Visit: Payer: 59 | Admitting: Physical Therapy

## 2020-07-24 ENCOUNTER — Encounter: Payer: Self-pay | Admitting: *Deleted

## 2020-07-24 NOTE — Telephone Encounter (Signed)
Copy of images requested from [facility] Radiology

## 2020-07-24 NOTE — Progress Notes (Signed)
  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  The patient then received a 14 Gy boost to yield a final dose of 60 Gy.  Narrative: The patient tolerated radiation treatment relatively well.     Plan: The patient has completed radiation treatment. The patient will return to radiation oncology clinic for routine followup in one month. I advised the patient to call or return sooner if they have any questions or concerns related to their recovery or treatment. ________________________________  Jodelle Gross, M.D., Ph.D.

## 2020-07-24 NOTE — Telephone Encounter (Signed)
Spoke with patients spouse.  He confirmed that patient was not eligible for trial at [facility].  She had MRI done at [facility] and at the follow up visit they gave her Decadron 8 mg in the office as loading dose and instructions for the following at home dosing.  Decadron 4 mg BID X 5 days; Decadron 2 mg BID for 5 days and then to follow up with Dr Mickeal Skinner for further taper instructions.  Patient also prescribed Protonix 40 mg daily while on Decadron.  Spouse questioned next steps for Decadron taper as patient will complete Decadron 2 mg BID this coming Saturday 10/2 and follow up isn't until Monday 10/4.    Routed to MD to question next steps on taper.

## 2020-07-25 DIAGNOSIS — G8194 Hemiplegia, unspecified affecting left nondominant side: Secondary | ICD-10-CM | POA: Diagnosis not present

## 2020-07-25 DIAGNOSIS — M1990 Unspecified osteoarthritis, unspecified site: Secondary | ICD-10-CM | POA: Diagnosis not present

## 2020-07-25 DIAGNOSIS — R414 Neurologic neglect syndrome: Secondary | ICD-10-CM | POA: Diagnosis not present

## 2020-07-25 DIAGNOSIS — C712 Malignant neoplasm of temporal lobe: Secondary | ICD-10-CM | POA: Diagnosis not present

## 2020-07-25 DIAGNOSIS — K7581 Nonalcoholic steatohepatitis (NASH): Secondary | ICD-10-CM | POA: Diagnosis not present

## 2020-07-25 DIAGNOSIS — G4733 Obstructive sleep apnea (adult) (pediatric): Secondary | ICD-10-CM | POA: Diagnosis not present

## 2020-07-25 DIAGNOSIS — Z9049 Acquired absence of other specified parts of digestive tract: Secondary | ICD-10-CM | POA: Diagnosis not present

## 2020-07-25 DIAGNOSIS — K760 Fatty (change of) liver, not elsewhere classified: Secondary | ICD-10-CM | POA: Diagnosis not present

## 2020-07-25 DIAGNOSIS — Z483 Aftercare following surgery for neoplasm: Secondary | ICD-10-CM | POA: Diagnosis not present

## 2020-07-25 DIAGNOSIS — I8390 Asymptomatic varicose veins of unspecified lower extremity: Secondary | ICD-10-CM | POA: Diagnosis not present

## 2020-07-25 DIAGNOSIS — F4321 Adjustment disorder with depressed mood: Secondary | ICD-10-CM | POA: Diagnosis not present

## 2020-07-26 ENCOUNTER — Ambulatory Visit: Payer: 59 | Admitting: Physical Therapy

## 2020-07-26 DIAGNOSIS — G4733 Obstructive sleep apnea (adult) (pediatric): Secondary | ICD-10-CM | POA: Diagnosis not present

## 2020-07-26 DIAGNOSIS — C712 Malignant neoplasm of temporal lobe: Secondary | ICD-10-CM | POA: Diagnosis not present

## 2020-07-26 DIAGNOSIS — K7581 Nonalcoholic steatohepatitis (NASH): Secondary | ICD-10-CM | POA: Diagnosis not present

## 2020-07-26 DIAGNOSIS — G8194 Hemiplegia, unspecified affecting left nondominant side: Secondary | ICD-10-CM | POA: Diagnosis not present

## 2020-07-26 DIAGNOSIS — F4321 Adjustment disorder with depressed mood: Secondary | ICD-10-CM | POA: Diagnosis not present

## 2020-07-26 DIAGNOSIS — M1990 Unspecified osteoarthritis, unspecified site: Secondary | ICD-10-CM | POA: Diagnosis not present

## 2020-07-26 DIAGNOSIS — R414 Neurologic neglect syndrome: Secondary | ICD-10-CM | POA: Diagnosis not present

## 2020-07-26 DIAGNOSIS — Z483 Aftercare following surgery for neoplasm: Secondary | ICD-10-CM | POA: Diagnosis not present

## 2020-07-26 DIAGNOSIS — I8390 Asymptomatic varicose veins of unspecified lower extremity: Secondary | ICD-10-CM | POA: Diagnosis not present

## 2020-07-27 ENCOUNTER — Telehealth: Payer: Self-pay | Admitting: *Deleted

## 2020-07-27 NOTE — Telephone Encounter (Signed)
Received call from Dr. Vianne Bulls from [place] requesting a call back @ [phone]

## 2020-07-30 ENCOUNTER — Inpatient Hospital Stay: Payer: 59

## 2020-07-30 ENCOUNTER — Inpatient Hospital Stay (HOSPITAL_BASED_OUTPATIENT_CLINIC_OR_DEPARTMENT_OTHER): Payer: 59 | Admitting: Internal Medicine

## 2020-07-30 ENCOUNTER — Telehealth: Payer: Self-pay | Admitting: Pharmacist

## 2020-07-30 ENCOUNTER — Other Ambulatory Visit: Payer: Self-pay | Admitting: Internal Medicine

## 2020-07-30 ENCOUNTER — Other Ambulatory Visit: Payer: Self-pay

## 2020-07-30 ENCOUNTER — Inpatient Hospital Stay: Payer: 59 | Attending: Internal Medicine

## 2020-07-30 VITALS — BP 141/85 | HR 72 | Temp 95.3°F | Resp 18 | Ht 66.0 in | Wt 159.4 lb

## 2020-07-30 DIAGNOSIS — Z8249 Family history of ischemic heart disease and other diseases of the circulatory system: Secondary | ICD-10-CM | POA: Diagnosis not present

## 2020-07-30 DIAGNOSIS — Z801 Family history of malignant neoplasm of trachea, bronchus and lung: Secondary | ICD-10-CM | POA: Diagnosis not present

## 2020-07-30 DIAGNOSIS — Z923 Personal history of irradiation: Secondary | ICD-10-CM | POA: Diagnosis not present

## 2020-07-30 DIAGNOSIS — Z79899 Other long term (current) drug therapy: Secondary | ICD-10-CM | POA: Diagnosis not present

## 2020-07-30 DIAGNOSIS — C712 Malignant neoplasm of temporal lobe: Secondary | ICD-10-CM | POA: Insufficient documentation

## 2020-07-30 DIAGNOSIS — Z7952 Long term (current) use of systemic steroids: Secondary | ICD-10-CM | POA: Diagnosis not present

## 2020-07-30 DIAGNOSIS — Z23 Encounter for immunization: Secondary | ICD-10-CM | POA: Insufficient documentation

## 2020-07-30 DIAGNOSIS — F329 Major depressive disorder, single episode, unspecified: Secondary | ICD-10-CM | POA: Insufficient documentation

## 2020-07-30 DIAGNOSIS — D696 Thrombocytopenia, unspecified: Secondary | ICD-10-CM | POA: Insufficient documentation

## 2020-07-30 DIAGNOSIS — K7400 Hepatic fibrosis, unspecified: Secondary | ICD-10-CM

## 2020-07-30 DIAGNOSIS — Z833 Family history of diabetes mellitus: Secondary | ICD-10-CM | POA: Diagnosis not present

## 2020-07-30 DIAGNOSIS — R748 Abnormal levels of other serum enzymes: Secondary | ICD-10-CM | POA: Insufficient documentation

## 2020-07-30 DIAGNOSIS — K7581 Nonalcoholic steatohepatitis (NASH): Secondary | ICD-10-CM

## 2020-07-30 DIAGNOSIS — C719 Malignant neoplasm of brain, unspecified: Secondary | ICD-10-CM

## 2020-07-30 DIAGNOSIS — K5903 Drug induced constipation: Secondary | ICD-10-CM

## 2020-07-30 LAB — CBC WITH DIFFERENTIAL (CANCER CENTER ONLY)
Abs Immature Granulocytes: 0.08 10*3/uL — ABNORMAL HIGH (ref 0.00–0.07)
Basophils Absolute: 0 10*3/uL (ref 0.0–0.1)
Basophils Relative: 0 %
Eosinophils Absolute: 0 10*3/uL (ref 0.0–0.5)
Eosinophils Relative: 0 %
HCT: 42 % (ref 36.0–46.0)
Hemoglobin: 15.2 g/dL — ABNORMAL HIGH (ref 12.0–15.0)
Immature Granulocytes: 2 %
Lymphocytes Relative: 9 %
Lymphs Abs: 0.5 10*3/uL — ABNORMAL LOW (ref 0.7–4.0)
MCH: 33.9 pg (ref 26.0–34.0)
MCHC: 36.2 g/dL — ABNORMAL HIGH (ref 30.0–36.0)
MCV: 93.8 fL (ref 80.0–100.0)
Monocytes Absolute: 0.4 10*3/uL (ref 0.1–1.0)
Monocytes Relative: 8 %
Neutro Abs: 4.5 10*3/uL (ref 1.7–7.7)
Neutrophils Relative %: 81 %
Platelet Count: 85 10*3/uL — ABNORMAL LOW (ref 150–400)
RBC: 4.48 MIL/uL (ref 3.87–5.11)
RDW: 15 % (ref 11.5–15.5)
WBC Count: 5.5 10*3/uL (ref 4.0–10.5)
nRBC: 0 % (ref 0.0–0.2)

## 2020-07-30 LAB — CMP (CANCER CENTER ONLY)
ALT: 145 U/L — ABNORMAL HIGH (ref 0–44)
AST: 44 U/L — ABNORMAL HIGH (ref 15–41)
Albumin: 4.1 g/dL (ref 3.5–5.0)
Alkaline Phosphatase: 51 U/L (ref 38–126)
Anion gap: 6 (ref 5–15)
BUN: 18 mg/dL (ref 8–23)
CO2: 26 mmol/L (ref 22–32)
Calcium: 9.8 mg/dL (ref 8.9–10.3)
Chloride: 106 mmol/L (ref 98–111)
Creatinine: 0.73 mg/dL (ref 0.44–1.00)
GFR, Est AFR Am: >60 mL/min (ref >60)
GFR, Estimated: >60 mL/min (ref >60)
Glucose, Bld: 102 mg/dL — ABNORMAL HIGH (ref 70–99)
Potassium: 3.8 mmol/L (ref 3.5–5.1)
Sodium: 138 mmol/L (ref 135–145)
Total Bilirubin: 2 mg/dL — ABNORMAL HIGH (ref 0.3–1.2)
Total Protein: 7 g/dL (ref 6.5–8.1)

## 2020-07-30 MED ORDER — ONDANSETRON HCL 8 MG PO TABS: 8.0000 mg | ORAL_TABLET | Freq: Two times a day (BID) | ORAL | 1 refills | Status: DC | PRN

## 2020-07-30 MED ORDER — TEMOZOLOMIDE 140 MG PO CAPS: 150.0000 mg/m2/d | ORAL_CAPSULE | Freq: Every day | ORAL | 0 refills | Status: DC

## 2020-07-30 MED FILL — ONDANSETRON HCL 8 MG TABLET: 8 | 15 days supply | Qty: 30 | Fill #0

## 2020-07-30 NOTE — Progress Notes (Signed)
[facility] [place], [place] [address] [phone]  Interval Evaluation  Date of Service: 07/30/20 Patient Name: Terry [name] Patient MRN: [id number] Patient DOB: 1956/08/26 Provider: Ventura Sellers, MD  Identifying Statement:  Terry [name] is a 64 y.o. female with right temporal glioblastoma   Referring Provider: Martin[place], Betty G, MD [place],  [place] [address]  Oncologic History: Oncology History  Glioblastoma multiforme of temporal lobe [facility])  04/19/2020 Surgery   Craniotomy, right temporal resection with Dr. Christella Noa   05/28/2020 -  Radiation Therapy   IMRT with concurrent Temodar 63m/m2     Biomarkers:  MGMT Methylated.  IDH 1/2 Wild type.  EGFR Not expressed  TERT "Mutated   Interval History:  Terry [name], now in having completed radiation and temozolomide.  [facility] No further seizures since episodes several weeks prior.  She has been taking the Keppra 5031mtwice per day.  Functional status is overall stable from prior, continues to utilize walker for ambulation, bedside commode.  Decadron is currently at 66m44mwice per day [facility] Patient presented last week with several days of progressive new-onset frontal headache.  This may be have been associated with change in taste, but no other neurologic complaints.  CNS imaging demonstrated a large right temporal mass.  She underwent resection on 04/19/20 with Dr. CabChristella Noaas discharged on 6/27.  She has no complaints [name] aside from some residual pain at the surgical site.  Does complain of "hole in vision" on the left side, but otherwise no neurologic deficits.  Taking decadron 4mg40mice per day.  Medications: Current Outpatient Medications on File Prior to Visit  Medication Sig Dispense Refill  . Cholecalciferol  (VITAMIN D) 1000 UNITS capsule Take 1,000 Units by mouth 2 (two) times daily.      . deMarland Kitchenamethasone (DECADRON) 1 MG tablet Take 1 tablet (1 mg total) by mouth daily. 60 tablet 1  . levETIRAcetam (KEPPRA) 500 MG tablet Take 1 tablet (500 mg total) by mouth 2 (two) times daily. 60 tablet 0  . ondansetron (ZOFRAN) 8 MG tablet Take 1 tablet (8 mg total) by mouth 2 (two) times daily as needed (nausea and vomiting). May take 30-60 minutes prior to Temodar administration if nausea/vomiting occurs. 30 tablet 1  . Probiotic Product (ALIGN) 4 MG CAPS Take 4 mg by mouth daily.     . teMarland Kitchenozolomide (TEMODAR) 140 MG capsule Take 1 capsule (140 mg total) by mouth daily. May take on an empty stomach to decrease nausea & vomiting. 42 capsule 0   No current facility-administered medications on file prior to visit.    Allergies:  Allergies  Allergen Reactions  . Niacin Hives and Rash   Past Medical History:  Past Medical History:  Diagnosis Date  . Arthritis   . Chronic nonalcoholic liver disease   . Complication of anesthesia   . Continuous leakage of urine   . Depression    situational  . Diverticulosis   . OSA (obstructive sleep apnea) 07/22/2016  . PONV (postoperative nausea and vomiting)   . Steatohepatitis   . Tinnitus   . Varicose veins    Past Surgical History:  Past Surgical History:  Procedure Laterality Date  . APPLICATION OF CRANIAL NAVIGATION N/A 04/19/2020   Procedure: APPLICATION OF CRANIAL NAVIGATION;  Surgeon: Ashok Pall, MD;  Location: [facility];  Service: Neurosurgery;  Laterality: N/A;  . arthroscopic left knee sugery    . bilateral bunionectomy    . BREAST BIOPSY Left   . CHOLECYSTECTOMY    . colonsocopy    . CRANIOTOMY Right 04/19/2020   Procedure: Right Temporal craniotomy for tumor resection;  Surgeon: Ashok Pall, MD;  Location: [facility];  Service: Neurosurgery;  Laterality: Right;  . debridement and removal and lateral release of torn cartlidge in rt knee    . gum grating     . rt knee arthroscopy    . torn cartledge in rt knee     Social History:  Social History   Socioeconomic History  . Marital status: Married    Spouse name: Not on file  . Number of children: Not on file  . Years of education: Not on file  . Highest education level: Not on file  Occupational History  . 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 Smoking status: Never Smoker  . Smokeless tobacco: Never Used  Substance and Sexual Activity  . Alcohol use: No  . Drug use: No  . Sexual activity: Not on file  Other Topics Concern  . 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 Difficulty of Paying Living Expenses: Not on file  Food Insecurity:   . Worried About Charity fundraiser in the Last Year: Not on file  . Ran Out of Food in the Last Year: Not on file  Transportation Needs:   . Lack of Transportation (Medical): Not on file  . Lack of Transportation (Non-Medical): Not on file  Physical Activity:   . Days of Exercise per Week: Not on file  . Minutes of Exercise per Session: Not on file  Stress:   . Feeling of Stress : Not on file  Social Connections:   . Frequency of Communication with Friends and Family: Not on file  . Frequency of Social Gatherings with Friends and Family: Not on file  . Attends Religious Services: Not on file  . Active Member of Clubs or Organizations: Not on file  . Attends Archivist Meetings: Not on file  . Marital Status: Not on file  Intimate Partner Violence:   . Fear of Current or Ex-Partner: Not on file  . Emotionally Abused: Not on file  . Physically Abused: Not on file  . Sexually Abused: Not on file   Family History:  Family History  Problem Relation Age of Onset  . Diabetes Mother   . Liver disease Mother   . Diabetes Sister   . Liver disease Sister   . Hypertension Other   .  Cancer Other   . Hemochromatosis Other   . Cancer Father        Throat  . Hypertension Brother   . Colon cancer Neg Hx   . Breast cancer Neg Hx     Review of Systems: Constitutional: Doesn't report fevers, chills or abnormal weight loss Eyes: Doesn't report blurriness of vision Ears, nose, mouth, throat, and face: Doesn't report sore throat Respiratory: Doesn't report cough, dyspnea or wheezes Cardiovascular: Doesn't report palpitation, chest discomfort  Gastrointestinal:  Doesn't report nausea, constipation, diarrhea GU: Doesn't report incontinence Skin: Doesn't report skin rashes Neurological: Per HPI Musculoskeletal: Doesn't report joint  pain Behavioral/Psych: Doesn't report anxiety  Physical Exam: Vitals:   07/30/20 1010  BP: (!) 141/85  Pulse: 72  Resp: 18  Temp: (!) 95.3 F (35.2 C)  SpO2: 99%   KPS: 90. General: Alert, cooperative, pleasant, in no acute distress Head: Normal EENT: No conjunctival injection or scleral icterus.  Lungs: Resp effort normal Cardiac: Regular rate Abdomen: Non-distended abdomen Skin: No rashes cyanosis or petechiae. Extremities: No clubbing or edema  Neurologic Exam: Mental Status: Awake, alert, attentive to examiner. Oriented to self and environment. Language is fluent with intact comprehension.  Mild motor and sensory neglect.  Cranial Nerves: Visual acuity is grossly normal. Left hemianopia. Extra-ocular movements intact. No ptosis. Face is symmetric Motor: Tone and bulk are normal. Power is 4/5 in left arm and leg. Reflexes are symmetric, no pathologic reflexes present.  Sensory: Extinguishes left leg and arm. Gait: Deferred   Labs: I have reviewed the data as listed    Component Value Date/Time   NA 141 07/09/2020 0948   K 3.9 07/09/2020 0948   CL 108 07/09/2020 0948   CO2 27 07/09/2020 0948   GLUCOSE 94 07/09/2020 0948   BUN 15 07/09/2020 0948   CREATININE 0.71 07/09/2020 0948   CALCIUM 9.9 07/09/2020 0948   PROT  6.7 07/09/2020 0948   ALBUMIN 4.0 07/09/2020 0948   AST 50 (H) 07/09/2020 0948   ALT 136 (H) 07/09/2020 0948   ALKPHOS 51 07/09/2020 0948   BILITOT 1.3 (H) 07/09/2020 0948   GFRNONAA >60 07/09/2020 0948   GFRAA >60 07/09/2020 0948   Lab Results  Component Value Date   WBC 4.7 07/09/2020   NEUTROABS 4.2 07/09/2020   HGB 14.4 07/09/2020   HCT 41.6 07/09/2020   MCV 95.4 07/09/2020   PLT 74 (L) 07/09/2020   Imaging:  [facility] Clinician Interpretation: I have personally reviewed the CNS images as listed.  My interpretation, in the context of the patient's clinical presentation, is treatment effect vs true progression  CT Head Wo Contrast  Result Date: 07/06/2020 CLINICAL DATA:  Upper extremity tremors, seizure, history of right temporal lobe mass status post resection EXAM: CT HEAD WITHOUT CONTRAST TECHNIQUE: Contiguous axial images were obtained from the base of the skull through the vertex without intravenous contrast. COMPARISON:  04/18/2020 04/20/2020 FINDINGS: Brain: Postsurgical changes from previous right temporal craniotomy and mass resection. Persistent cystic area within the surgical bed measuring 2.1 x 1.9 cm, with adjacent coarse calcification. There is diffuse edema throughout the periventricular and subcortical white matter involving the right temporal, occipital, and parietal lobes. This may reflect a combination of vasogenic edema and post radiation change. Contrasted MRI would be needed to assess for residual or recurrent disease. There is no evidence of acute hemorrhage. Slight midline shift to the left, unchanged since previous MRI 04/20/2020. No acute extra-axial fluid collections. Vascular: No hyperdense vessel or unexpected calcification. Skull: Postsurgical changes from right temporal craniotomy. No acute bony abnormality. Sinuses/Orbits: No acute finding. Other: None. IMPRESSION: 1. Postsurgical changes from right temporal craniotomy and previous mass resection. Cystic area  within the surgical bed could be related to residual/recurrent disease or postsurgical change. Contrasted MRI would be needed to assess for residual or recurrent disease. 2. Increased hypodensity throughout the periventricular and subcortical white matter throughout the right temporal, parietal, and occipital regions. This is likely a combination of vasogenic edema and post therapeutic change. Again, MRI would be useful. 3. No evidence of acute infarct or hemorrhage. Electronically Signed   By: Randa Ngo  M.D.   On: 07/06/2020 20:18   Assessment/Plan Glioblastoma multiforme of temporal lobe ([place]) [C71.2]   Terry [name] is clinically stable [name], now having completed IMRT and daily Temodar x6 weeks.  MRI brain performed at [facility] demonstrates progression of disease; etiology is either inflammatory pseudo-progression or organic outgrowth of tumor through radiotherapy.    We recommended initiating treatment with Temozolomide 150 mg/m2, on for five days and off for twenty three days in twenty eight day cycles. The patient will have a complete blood count performed on days 21 and 28 of each cycle, and a comprehensive metabolic panel performed on day 28 of each cycle. Labs may need to be performed more often. Zofran will prescribed for home use for nausea/vomiting.   Chemotherapy should be held for the following:  ANC less than 1,000  Platelets less than 100,000  LFT or creatinine greater than 2x ULN  If clinical concerns/contraindications develop  Because of thrombocytopenia and elevated liver enzymes demonstrated [name], will recommend repeating labs next week prior to potential start of therapy on 08/06/20.    We also discussed tumor treating fields device and its potential utility in the adjuvant phase.  She will reach out to [place] if interested in pursuing [place].  Decadron should be decreased to 39m daily if tolerated.  Keppra should be continued at 5055mBID, ativan if needed.  All  questions were answered. The patient knows to call the clinic with any problems, questions or concerns. No barriers to learning were detected.  I have spent a total of 40 minutes of face-to-face and non-face-to-face time, excluding clinical staff time, preparing to see patient, ordering tests and/or medications, counseling the patient, and documenting clinical information in the electronic or other health record    ZaVentura SellersMD Medical Director of Neuro-Oncology [facility]0/04/21 10:13 AM

## 2020-07-30 NOTE — Telephone Encounter (Addendum)
Oral Oncology Pharmacist Encounter  Received new prescription for Temodar (temozolomide) for the maintenance treatment of glioblastoma, planned duration 6 cycles.  Prescription dose and frequency assessed for appropriateness. Appropriate for therapy initiation pending repeat labs.    CBC w/ Diff and CMP from 07/30/20 assessed, noted pltc of 85 K/uL, and LFTs elevated - ALT 145 U/L and T. Bili 2 mg/dL. Per Dr. Mickeal Skinner - patient will have repeat labs prior to tentative start date of 08/06/20  Current medication list in Epic reviewed, no relevant/signficiant DDIs with Temodar identified.  Evaluated chart and no patient barriers to medication adherence noted.   Prescription has been e-scribed to the [facility] for benefits analysis and approval.  Oral Oncology Clinic will continue to follow for insurance authorization, copayment issues, initial counseling and start date.  Leron Croak, PharmD, BCPS Hematology/Oncology Clinical Pharmacist [facility] [phone] 07/30/2020 4:10 PM

## 2020-07-31 ENCOUNTER — Telehealth: Payer: Self-pay | Admitting: Internal Medicine

## 2020-07-31 ENCOUNTER — Encounter: Payer: Self-pay | Admitting: Internal Medicine

## 2020-07-31 DIAGNOSIS — C711 Malignant neoplasm of frontal lobe: Secondary | ICD-10-CM | POA: Diagnosis not present

## 2020-07-31 LAB — HEPATITIS PANEL, ACUTE
HCV Ab: NONREACTIVE
Hep A IgM: NONREACTIVE
Hep B C IgM: NONREACTIVE
Hepatitis B Surface Ag: NONREACTIVE

## 2020-07-31 MED ORDER — LORATADINE 10 MG PO TABS
ORAL_TABLET | ORAL | Status: AC
  Filled 2020-07-31: qty 1

## 2020-07-31 MED ORDER — ACETAMINOPHEN 325 MG PO TABS
ORAL_TABLET | ORAL | Status: AC
  Filled 2020-07-31: qty 2

## 2020-07-31 NOTE — Telephone Encounter (Signed)
Scheduled per 10/4 los. Unable to reach pt. Left voicemail with appt times and dates.

## 2020-08-02 MED FILL — TEMOZOLOMIDE 140 MG CAPS: 140 | 5 days supply | Qty: 10 | Fill #0

## 2020-08-02 NOTE — Telephone Encounter (Signed)
Oral Chemotherapy Pharmacist Encounter  I spoke with patient for overview of: Temodar (temozolomide) for the maintenance treatment of glioblastoma multiforme, planned duration 6-12 months of treatment.  Counseled on administration, dosing, side effects, monitoring, drug-food interactions, safe handling, storage, and disposal.  Patient will take Temodar 156m capsules, 2 capsules (280 mg total daily dose) by mouth once daily, may take at bedtime and on an empty stomach to decrease nausea and vomiting.  If 1st cycle is well tolerated, [name] that Temodar dose may be increased to 200 mg/m2 daily for 5 days on, 23 days off, repeated every 28 days for subsequent cycles   Patient will take Temodar daily for 5 days on, 23 days off, and repeated.  Temodar start date: tentatively 08/06/20 pending repeat labs per Dr. VMickeal Skinner  Patient will take Zofran 85mtablet, 1 tablet by mouth 30-60 min prior to Temodar dose to help decrease N/V.   Adverse effects include but are not limited to: nausea, vomiting, anorexia, GI upset, rash, drug fever, and fatigue. Rare but serious adverse effects of pneumocystis pneumonia and secondary malignancy also discussed.  We discussed strategies to manage constipation if they occur secondary to ondansetron dosing. [name] she plans to continue using Miralax to prevent constipation from the ondansetron.   PCP prophylaxis will not be initiated at this time, but may be added based on lymphocyte count in the future.  Reviewed importance of keeping a medication schedule and plan for any missed doses. No barriers to medication adherence identified.  Medication reconciliation performed and medication/allergy list updated.  Insurance authorization for Temodar has been obtained. Test claim at the pharmacy revealed copayment $0 for 1st fill of Temodar. [place] 08[place]02/2020 to deliver to patient's home on  08/03/2020.   Patient informed the pharmacy will reach out 5-7 days prior to needing next fill of Temodar to coordinate continued medication acquisition to prevent break in therapy.  All questions answered.  [name] understanding and appreciation.   Medication education handout placed in mail for patient. Patient knows to call the office with questions or concerns. Oral Chemotherapy Clinic phone number provided to patient.   ReLeron CroakPharmD, BCPS Hematology/Oncology Clinical Pharmacist [phone]/04/2020 9:19 AM

## 2020-08-06 ENCOUNTER — Inpatient Hospital Stay: Payer: 59

## 2020-08-06 ENCOUNTER — Other Ambulatory Visit: Payer: Self-pay | Admitting: *Deleted

## 2020-08-06 ENCOUNTER — Other Ambulatory Visit: Payer: Self-pay

## 2020-08-06 ENCOUNTER — Inpatient Hospital Stay: Payer: 59 | Admitting: Internal Medicine

## 2020-08-06 ENCOUNTER — Telehealth: Payer: Self-pay | Admitting: Internal Medicine

## 2020-08-06 ENCOUNTER — Telehealth: Payer: Self-pay | Admitting: Radiation Oncology

## 2020-08-06 DIAGNOSIS — R748 Abnormal levels of other serum enzymes: Secondary | ICD-10-CM | POA: Diagnosis not present

## 2020-08-06 DIAGNOSIS — F329 Major depressive disorder, single episode, unspecified: Secondary | ICD-10-CM | POA: Diagnosis not present

## 2020-08-06 DIAGNOSIS — Z79899 Other long term (current) drug therapy: Secondary | ICD-10-CM | POA: Diagnosis not present

## 2020-08-06 DIAGNOSIS — C712 Malignant neoplasm of temporal lobe: Secondary | ICD-10-CM | POA: Diagnosis not present

## 2020-08-06 DIAGNOSIS — Z801 Family history of malignant neoplasm of trachea, bronchus and lung: Secondary | ICD-10-CM | POA: Diagnosis not present

## 2020-08-06 DIAGNOSIS — Z23 Encounter for immunization: Secondary | ICD-10-CM | POA: Diagnosis not present

## 2020-08-06 DIAGNOSIS — D696 Thrombocytopenia, unspecified: Secondary | ICD-10-CM | POA: Diagnosis not present

## 2020-08-06 DIAGNOSIS — Z923 Personal history of irradiation: Secondary | ICD-10-CM | POA: Diagnosis not present

## 2020-08-06 DIAGNOSIS — Z7952 Long term (current) use of systemic steroids: Secondary | ICD-10-CM | POA: Diagnosis not present

## 2020-08-06 LAB — CMP (CANCER CENTER ONLY)
ALT: 198 U/L — ABNORMAL HIGH (ref 0–44)
AST: 75 U/L — ABNORMAL HIGH (ref 15–41)
Albumin: 4.1 g/dL (ref 3.5–5.0)
Alkaline Phosphatase: 55 U/L (ref 38–126)
Anion gap: 5 (ref 5–15)
BUN: 13 mg/dL (ref 8–23)
CO2: 28 mmol/L (ref 22–32)
Calcium: 10.1 mg/dL (ref 8.9–10.3)
Chloride: 105 mmol/L (ref 98–111)
Creatinine: 0.71 mg/dL (ref 0.44–1.00)
GFR, Estimated: >60 mL/min
Glucose, Bld: 103 mg/dL — ABNORMAL HIGH (ref 70–99)
Potassium: 3.9 mmol/L (ref 3.5–5.1)
Sodium: 138 mmol/L (ref 135–145)
Total Bilirubin: 2.4 mg/dL — ABNORMAL HIGH (ref 0.3–1.2)
Total Protein: 7.1 g/dL (ref 6.5–8.1)

## 2020-08-06 LAB — CBC WITH DIFFERENTIAL (CANCER CENTER ONLY)
Abs Immature Granulocytes: 0.05 10*3/uL (ref 0.00–0.07)
Basophils Absolute: 0 10*3/uL (ref 0.0–0.1)
Basophils Relative: 1 %
Eosinophils Absolute: 0 10*3/uL (ref 0.0–0.5)
Eosinophils Relative: 0 %
HCT: 42.2 % (ref 36.0–46.0)
Hemoglobin: 14.9 g/dL (ref 12.0–15.0)
Immature Granulocytes: 1 %
Lymphocytes Relative: 7 %
Lymphs Abs: 0.4 10*3/uL — ABNORMAL LOW (ref 0.7–4.0)
MCH: 33.4 pg (ref 26.0–34.0)
MCHC: 35.3 g/dL (ref 30.0–36.0)
MCV: 94.6 fL (ref 80.0–100.0)
Monocytes Absolute: 0.5 10*3/uL (ref 0.1–1.0)
Monocytes Relative: 9 %
Neutro Abs: 4.5 10*3/uL (ref 1.7–7.7)
Neutrophils Relative %: 82 %
Platelet Count: 80 10*3/uL — ABNORMAL LOW (ref 150–400)
RBC: 4.46 MIL/uL (ref 3.87–5.11)
RDW: 15.4 % (ref 11.5–15.5)
WBC Count: 5.4 10*3/uL (ref 4.0–10.5)
nRBC: 0 % (ref 0.0–0.2)

## 2020-08-06 MED ORDER — LEVETIRACETAM 500 MG PO TABS: 500.0000 mg | ORAL_TABLET | Freq: Two times a day (BID) | ORAL | 3 refills | Status: DC

## 2020-08-06 MED FILL — levETIRAcetam 500 MG TABS: 500 | 30 days supply | Qty: 60 | Fill #0

## 2020-08-06 NOTE — Telephone Encounter (Signed)
I think that was the initial thought. She told me that. Dr. Mickeal Skinner felt that took lesser priority over med primary brain disease.

## 2020-08-06 NOTE — Telephone Encounter (Addendum)
  Radiation Oncology         [phone] ________________________________  Name: Terry Bell MRN: [id number]  Date of Service: 08/06/2020  DOB: 1955-11-30  Post Treatment Telephone Note  Diagnosis:   Right Temporal Glioblastoma  Interval Since Last Radiation:  4 weeks   05/28/20 - 07/09/20: The patient was treated to the target region and areas of edema initially to a dose of 46 Gy using a IMRT technique.  The patient then received a 14 Gy boost to yield a final dose of 60 Gy.  Narrative:  The patient was contacted today for routine follow-up. During treatment she did very well with radiotherapy and did not have significant desquamation. She reports she is having fatigue and is waiting to find out when she can start temodar. Her platelets last week were in the 85k range, and today they were 80k. Her LFTs continue to be followed closely as they are elevated.   Impression/Plan: 1. Right Temporal Glioblastoma. The patient has been doing well since completion of radiotherapy. We discussed that we would be happy to continue to follow her as needed, but she will also continue to follow up with Dr. Mickeal Skinner in neuro oncology.      Carola Rhine, PAC

## 2020-08-06 NOTE — Telephone Encounter (Signed)
Reviewed labs with [name] Bell.  Recommended decreasing decadron to 34m daily x5-7 days, then decreasing to 140mx5-7 days, then stopping if tolerated.  Suspect liver enzymes trend could be related to decadron rather then prior chemo, which she has been off for more than 5 weeks.  She will follow up in 2 weeks with labs an in person visit  ZaVentura SellersMD

## 2020-08-07 ENCOUNTER — Telehealth: Payer: Self-pay | Admitting: Internal Medicine

## 2020-08-07 NOTE — Telephone Encounter (Signed)
Scheduled per 10/12 sch message. Pt is aware of appts on 10/25.

## 2020-08-14 ENCOUNTER — Telehealth: Payer: Self-pay | Admitting: Medical Oncology

## 2020-08-14 NOTE — Telephone Encounter (Signed)
Changes-Increasing weakness left side and drifting to the left . Almost fell twice today .   Denies numbness, tingling . Reports good left hand grip.  Current dose of decadron is down to 1 mg/day. Per Dr Mickeal Skinner , I instructed Alroy Dust to give [place] 20m decadron today and then 4 mg /day until he sees her on 08/20/20.  He was instructed to call if symptoms worsen . He voiced understanding.

## 2020-08-20 ENCOUNTER — Other Ambulatory Visit: Payer: Self-pay

## 2020-08-20 ENCOUNTER — Inpatient Hospital Stay (HOSPITAL_BASED_OUTPATIENT_CLINIC_OR_DEPARTMENT_OTHER): Payer: 59 | Admitting: Internal Medicine

## 2020-08-20 ENCOUNTER — Inpatient Hospital Stay: Payer: 59

## 2020-08-20 VITALS — BP 127/86 | HR 67 | Temp 97.4°F | Resp 18 | Ht 66.0 in | Wt 158.3 lb

## 2020-08-20 DIAGNOSIS — Z79899 Other long term (current) drug therapy: Secondary | ICD-10-CM | POA: Diagnosis not present

## 2020-08-20 DIAGNOSIS — Z23 Encounter for immunization: Secondary | ICD-10-CM

## 2020-08-20 DIAGNOSIS — C712 Malignant neoplasm of temporal lobe: Secondary | ICD-10-CM

## 2020-08-20 DIAGNOSIS — Z801 Family history of malignant neoplasm of trachea, bronchus and lung: Secondary | ICD-10-CM | POA: Diagnosis not present

## 2020-08-20 DIAGNOSIS — D696 Thrombocytopenia, unspecified: Secondary | ICD-10-CM | POA: Diagnosis not present

## 2020-08-20 DIAGNOSIS — Z923 Personal history of irradiation: Secondary | ICD-10-CM | POA: Diagnosis not present

## 2020-08-20 DIAGNOSIS — R748 Abnormal levels of other serum enzymes: Secondary | ICD-10-CM | POA: Diagnosis not present

## 2020-08-20 DIAGNOSIS — Z7952 Long term (current) use of systemic steroids: Secondary | ICD-10-CM | POA: Diagnosis not present

## 2020-08-20 DIAGNOSIS — F329 Major depressive disorder, single episode, unspecified: Secondary | ICD-10-CM | POA: Diagnosis not present

## 2020-08-20 LAB — CMP (CANCER CENTER ONLY)
ALT: 133 U/L — ABNORMAL HIGH (ref 0–44)
AST: 47 U/L — ABNORMAL HIGH (ref 15–41)
Albumin: 4 g/dL (ref 3.5–5.0)
Alkaline Phosphatase: 57 U/L (ref 38–126)
Anion gap: 8 (ref 5–15)
BUN: 9 mg/dL (ref 8–23)
CO2: 24 mmol/L (ref 22–32)
Calcium: 9.8 mg/dL (ref 8.9–10.3)
Chloride: 108 mmol/L (ref 98–111)
Creatinine: 0.71 mg/dL (ref 0.44–1.00)
GFR, Estimated: >60 mL/min (ref >60)
Glucose, Bld: 83 mg/dL (ref 70–99)
Potassium: 3.5 mmol/L (ref 3.5–5.1)
Sodium: 140 mmol/L (ref 135–145)
Total Bilirubin: 1.8 mg/dL — ABNORMAL HIGH (ref 0.3–1.2)
Total Protein: 6.7 g/dL (ref 6.5–8.1)

## 2020-08-20 LAB — CBC WITH DIFFERENTIAL (CANCER CENTER ONLY)
Abs Immature Granulocytes: 0.1 10*3/uL — ABNORMAL HIGH (ref 0.00–0.07)
Basophils Absolute: 0 10*3/uL (ref 0.0–0.1)
Basophils Relative: 0 %
Eosinophils Absolute: 0 10*3/uL (ref 0.0–0.5)
Eosinophils Relative: 0 %
HCT: 41.4 % (ref 36.0–46.0)
Hemoglobin: 14.6 g/dL (ref 12.0–15.0)
Immature Granulocytes: 2 %
Lymphocytes Relative: 14 %
Lymphs Abs: 0.7 10*3/uL (ref 0.7–4.0)
MCH: 34 pg (ref 26.0–34.0)
MCHC: 35.3 g/dL (ref 30.0–36.0)
MCV: 96.3 fL (ref 80.0–100.0)
Monocytes Absolute: 0.6 10*3/uL (ref 0.1–1.0)
Monocytes Relative: 12 %
Neutro Abs: 3.6 10*3/uL (ref 1.7–7.7)
Neutrophils Relative %: 72 %
Platelet Count: 106 10*3/uL — ABNORMAL LOW (ref 150–400)
RBC: 4.3 MIL/uL (ref 3.87–5.11)
RDW: 15.6 % — ABNORMAL HIGH (ref 11.5–15.5)
WBC Count: 5 10*3/uL (ref 4.0–10.5)
nRBC: 0 % (ref 0.0–0.2)

## 2020-08-20 MED ORDER — INFLUENZA VAC SPLIT QUAD 0.5 ML IM SUSY
0.5000 mL | PREFILLED_SYRINGE | Freq: Once | INTRAMUSCULAR | Status: AC
  Administered 2020-08-20: 0.5 mL via INTRAMUSCULAR

## 2020-08-20 MED ORDER — INFLUENZA VAC SPLIT QUAD 0.5 ML IM SUSY
PREFILLED_SYRINGE | INTRAMUSCULAR | Status: AC
  Filled 2020-08-20: qty 0.5

## 2020-08-20 NOTE — Progress Notes (Signed)
[facility] at [place], [place] [address] [phone]  Interval Evaluation  Date of Service: 08/20/20 Patient Name: Terry Bell Patient MRN: [id number] Patient DOB: [birth year] Provider: Ventura Sellers, MD  Identifying Statement:  Terry Bell is a 64 y.o. female with right temporal glioblastoma   Referring Provider: Martinique, Betty G, MD [place],  [place] [address]  Oncologic History: Oncology History  Glioblastoma multiforme of temporal lobe [facility])  04/19/2020 Surgery   Craniotomy, right temporal resection with Dr. Christella Noa   05/28/2020 - 07/09/2020 Radiation Therapy   IMRT with concurrent Temodar 70m/m2   05/29/2020 -  Chemotherapy   The patient had temozolomide (TEMODAR) 140 MG capsule, 150 mg/m2/day = 280 mg (100 % of original dose 150 mg/m2/day), Oral, Daily, 0 of 1 cycle, Start date: 07/30/2020, End date: -- Dose modification: 150 mg/m2/day (original dose 150 mg/m2/day, Cycle 1)  for chemotherapy treatment.      Biomarkers:  MGMT Methylated.  IDH 1/2 Wild type.  EGFR Not expressed  TERT "Mutated   Interval History:  Terry HEADINGSpresents today, following repeat labs. Clinical function has clearly improved since starting on the 440mdaily decadron.  She continues to ambulate with a walker due to left sided weakness, but this is modestly improved from prior. No further seizures since episodes several weeks prior.  She has been taking the Keppra 50075mwice per day.    H+P (04/26/20) Patient presented last week with several days of progressive new-onset frontal headache.  This may be have been associated with change in taste, but no other neurologic complaints.  CNS imaging demonstrated a large right temporal mass.  She underwent resection on 04/19/20 with Dr. CabChristella Noaas discharged on 6/27.  She has no complaints today aside from some residual pain at the surgical site.  Does complain of "hole in vision"  on the left side, but otherwise no neurologic deficits.  Taking decadron 4mg9mice per day.  Medications: Current Outpatient Medications on File Prior to Visit  Medication Sig Dispense Refill  . Cholecalciferol (VITAMIN D) 1000 UNITS capsule Take 1,000 Units by mouth 2 (two) times daily.      . deMarland Kitchenamethasone (DECADRON) 2 MG tablet Take by mouth.    . levETIRAcetam (KEPPRA) 500 MG tablet Take 1 tablet (500 mg total) by mouth 2 (two) times daily. 60 tablet 3  . ondansetron (ZOFRAN) 8 MG tablet Take 1 tablet (8 mg total) by mouth 2 (two) times daily as needed (nausea and vomiting). May take 30-60 minutes prior to Temodar administration if nausea/vomiting occurs. 30 tablet 1  . pantoprazole (PROTONIX) 40 MG tablet     . polyethylene glycol (MIRALAX / GLYCOLAX) 17 g packet Take 17 g by mouth daily.    . Probiotic Product (ALIGN) 4 MG CAPS Take 4 mg by mouth daily.     . teMarland Kitchenozolomide (TEMODAR) 140 MG capsule Take 2 capsules (280 mg total) by mouth daily. May take on an empty stomach to decrease nausea & vomiting. 10 capsule 0   No current facility-administered medications on file prior to visit.    Allergies:  Allergies  Allergen Reactions  . Niacin Hives and Rash   Past Medical History:  Past Medical History:  Diagnosis Date  . Arthritis   . Chronic nonalcoholic liver disease   . Complication of anesthesia   . Continuous leakage of urine   . Depression    situational  . Diverticulosis   .  OSA (obstructive sleep apnea) 07/22/2016  . PONV (postoperative nausea and vomiting)   . Steatohepatitis   . Tinnitus   . Varicose veins    Past Surgical History:  Past Surgical History:  Procedure Laterality Date  . APPLICATION OF CRANIAL NAVIGATION N/A 04/19/2020   Procedure: APPLICATION OF CRANIAL NAVIGATION;  Surgeon: Ashok Pall, MD;  Location: [facility];  Service: Neurosurgery;  Laterality: N/A;  . arthroscopic left knee sugery    . bilateral bunionectomy    . BREAST BIOPSY Left   .  CHOLECYSTECTOMY    . colonsocopy    . CRANIOTOMY Right 04/19/2020   Procedure: Right Temporal craniotomy for tumor resection;  Surgeon: Ashok Pall, MD;  Location: [facility];  Service: Neurosurgery;  Laterality: Right;  . debridement and removal and lateral release of torn cartlidge in rt knee    . gum grating    . rt knee arthroscopy    . torn cartledge in rt knee     Social History:  Social History   Socioeconomic History  . Marital status: Married    Spouse name: Not on file  . Number of children: Not on file  . Years of education: Not on file  . Highest education level: Not on file  Occupational History  . 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 Smoking status: Never Smoker  . Smokeless tobacco: Never Used  Substance and Sexual Activity  . Alcohol use: No  . Drug use: No  . Sexual activity: Not on file  Other Topics Concern  . 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 Difficulty of Paying Living Expenses: Not on file  Food Insecurity:   . Worried About Charity fundraiser in the Last Year: Not on file  . Ran Out of Food in the Last Year: Not on file  Transportation Needs:   . Lack of Transportation (Medical): Not on file  . Lack of Transportation (Non-Medical): Not on file  Physical Activity:   . Days of Exercise per Week: Not on file  . Minutes of Exercise per Session: Not on file  Stress:   . Feeling of Stress : Not on file  Social Connections:   . Frequency of Communication with Friends and Family: Not on file  . Frequency of Social Gatherings with Friends and Family: Not on file  . Attends Religious Services: Not on file  . Active Member of Clubs or Organizations: Not on file  . Attends Archivist Meetings: Not on file  . Marital Status: Not on file  Intimate Partner Violence:   . Fear of Current or  Ex-Partner: Not on file  . Emotionally Abused: Not on file  . Physically Abused: Not on file  . Sexually Abused: Not on file   Family History:  Family History  Problem Relation Age of Onset  . Diabetes Mother   . Liver disease Mother   . Diabetes Sister   . Liver disease Sister   . Hypertension Other   . Cancer Other   . Hemochromatosis Other   . Cancer Father        Throat  . Hypertension Brother   . Colon cancer Neg Hx   . Breast cancer Neg Hx     Review of Systems: Constitutional: Doesn't report fevers, chills or abnormal  weight loss Eyes: Doesn't report blurriness of vision Ears, nose, mouth, throat, and face: Doesn't report sore throat Respiratory: Doesn't report cough, dyspnea or wheezes Cardiovascular: Doesn't report palpitation, chest discomfort  Gastrointestinal:  Doesn't report nausea, constipation, diarrhea GU: Doesn't report incontinence Skin: Doesn't report skin rashes Neurological: Per HPI Musculoskeletal: Doesn't report joint pain Behavioral/Psych: Doesn't report anxiety  Physical Exam: Vitals:   08/20/20 0831  BP: 127/86  Pulse: 67  Resp: 18  Temp: (!) 97.4 F (36.3 C)  SpO2: 100%   KPS: 90. General: Alert, cooperative, pleasant, in no acute distress Head: Normal EENT: No conjunctival injection or scleral icterus.  Lungs: Resp effort normal Cardiac: Regular rate Abdomen: Non-distended abdomen Skin: No rashes cyanosis or petechiae. Extremities: No clubbing or edema  Neurologic Exam: Mental Status: Awake, alert, attentive to examiner. Oriented to self and environment. Language is fluent with intact comprehension.  Mild motor and sensory neglect.  Cranial Nerves: Visual acuity is grossly normal. Left hemianopia. Extra-ocular movements intact. No ptosis. Face is symmetric Motor: Tone and bulk are normal. Power is 4+/5 in left arm and leg. Reflexes are symmetric, no pathologic reflexes present.  Sensory: Extinguishes left leg and arm. Gait:  Deferred   Labs: I have reviewed the data as listed    Component Value Date/Time   NA 138 08/06/2020 1054   K 3.9 08/06/2020 1054   CL 105 08/06/2020 1054   CO2 28 08/06/2020 1054   GLUCOSE 103 (H) 08/06/2020 1054   BUN 13 08/06/2020 1054   CREATININE 0.71 08/06/2020 1054   CALCIUM 10.1 08/06/2020 1054   PROT 7.1 08/06/2020 1054   ALBUMIN 4.1 08/06/2020 1054   AST 75 (H) 08/06/2020 1054   ALT 198 (H) 08/06/2020 1054   ALKPHOS 55 08/06/2020 1054   BILITOT 2.4 (H) 08/06/2020 1054   GFRNONAA >60 08/06/2020 1054   GFRAA >60 07/30/2020 1202   Lab Results  Component Value Date   WBC 5.0 08/20/2020   NEUTROABS 3.6 08/20/2020   HGB 14.6 08/20/2020   HCT 41.4 08/20/2020   MCV 96.3 08/20/2020   PLT 106 (L) 08/20/2020    Assessment/Plan Glioblastoma multiforme of temporal lobe ([facility]) [C71.2]   Terry Bell is clinically improved today.  Labs demonstrate improvement in liver enzymes and thrombocytopenia.  We recommended initiating treatment with cycle #1 Temozolomide 150 mg/m2, on for five days and off for twenty three days in twenty eight day cycles. The patient will have a complete blood count performed on days 21 and 28 of each cycle, and a comprehensive metabolic panel performed on day 28 of each cycle. Labs may need to be performed more often. Zofran will prescribed for home use for nausea/vomiting.   Chemotherapy should be held for the following:  ANC less than 1,000  Platelets less than 100,000  LFT or creatinine greater than 2x ULN  If clinical concerns/contraindications develop  Decadron will continue 34m daily for now.  Keppra should be continued at 5068mBID, ativan if needed.  We ask that DoLYRIK DOCKSTADEReturn to clinic in 1 months prior to cycle #2 with labs for evaluation.  All questions were answered. The patient knows to call the clinic with any problems, questions or concerns. No barriers to learning were detected.  I have spent a total of 30 minutes of  face-to-face and non-face-to-face time, excluding clinical staff time, preparing to see patient, ordering tests and/or medications, counseling the patient, and documenting clinical information in the electronic or other health record  Ventura Sellers, MD Medical Director of Neuro-Oncology [facility] [place] 08/20/20 8:33 AM

## 2020-08-27 ENCOUNTER — Telehealth: Payer: Self-pay | Admitting: Internal Medicine

## 2020-08-27 ENCOUNTER — Other Ambulatory Visit: Payer: Self-pay

## 2020-08-27 ENCOUNTER — Encounter: Payer: Self-pay | Admitting: Internal Medicine

## 2020-08-27 ENCOUNTER — Encounter: Payer: Self-pay | Admitting: *Deleted

## 2020-08-27 ENCOUNTER — Ambulatory Visit (HOSPITAL_COMMUNITY)
Admission: RE | Admit: 2020-08-27 | Discharge: 2020-08-27 | Disposition: A | Discharge status: Home / Self Care | Payer: 59 | Source: Ambulatory Visit | Attending: Internal Medicine | Admitting: Internal Medicine

## 2020-08-27 ENCOUNTER — Other Ambulatory Visit: Payer: Self-pay | Admitting: Internal Medicine

## 2020-08-27 DIAGNOSIS — C712 Malignant neoplasm of temporal lobe: Secondary | ICD-10-CM

## 2020-08-27 MED ORDER — RIVAROXABAN 20 MG PO TABS: 20.0000 mg | ORAL_TABLET | Freq: Every day | ORAL | 3 refills | Status: DC

## 2020-08-27 MED FILL — XARELTO 20 MG TABLET: 20 | 30 days supply | Qty: 30 | Fill #0

## 2020-08-27 NOTE — Progress Notes (Signed)
VASCULAR LAB    Left lower extremity venous duplex has been performed.  See CV proc for preliminary results.  Called Dr. Mickeal Skinner with results.  Terry Bell, RVT 08/27/2020, 3:55 PM

## 2020-08-27 NOTE — Telephone Encounter (Signed)
Received call from vascular lab.  DVT uncovered with proximal-distal extension within the left leg.  Patient currently is symptomatic with swelling and discomfort in the leg.  No respiratory symptoms.  Will call prescription for Xarelto 4m daily for anticoagulation.  Recent labs were within normal limits, renal function.  ZVentura Sellers MD

## 2020-08-28 ENCOUNTER — Telehealth: Payer: Self-pay | Admitting: *Deleted

## 2020-08-28 NOTE — Telephone Encounter (Signed)
08/27/2020 spoke with Karn Pickler (spouse) to advise of new medication ordered Xarelto for dvt.   No further questions at this time.

## 2020-08-31 DIAGNOSIS — C711 Malignant neoplasm of frontal lobe: Secondary | ICD-10-CM | POA: Diagnosis not present

## 2020-09-03 ENCOUNTER — Encounter: Payer: Self-pay | Admitting: Internal Medicine

## 2020-09-03 ENCOUNTER — Other Ambulatory Visit: Payer: Self-pay | Admitting: Internal Medicine

## 2020-09-03 MED ORDER — PANTOPRAZOLE SODIUM 40 MG PO TBEC
40.0000 mg | DELAYED_RELEASE_TABLET | Freq: Every day | ORAL | 3 refills | Status: DC
Start: 2020-09-03 — End: 2020-09-03

## 2020-09-03 MED FILL — levETIRAcetam 500 MG TABS: 500 | 30 days supply | Qty: 60 | Fill #1

## 2020-09-03 MED FILL — PANTOPRAZOLE SOD DR 40 MG T: 40 | 30 days supply | Qty: 30 | Fill #0

## 2020-09-14 ENCOUNTER — Encounter: Payer: Self-pay | Admitting: Internal Medicine

## 2020-09-18 ENCOUNTER — Inpatient Hospital Stay: Payer: 59 | Attending: Internal Medicine

## 2020-09-18 ENCOUNTER — Other Ambulatory Visit: Payer: Self-pay | Admitting: Internal Medicine

## 2020-09-18 ENCOUNTER — Inpatient Hospital Stay (HOSPITAL_BASED_OUTPATIENT_CLINIC_OR_DEPARTMENT_OTHER): Payer: 59 | Admitting: Internal Medicine

## 2020-09-18 ENCOUNTER — Other Ambulatory Visit: Payer: Self-pay

## 2020-09-18 VITALS — BP 119/77 | HR 58 | Temp 97.1°F | Resp 18 | Ht 66.0 in | Wt 161.7 lb

## 2020-09-18 DIAGNOSIS — C712 Malignant neoplasm of temporal lobe: Secondary | ICD-10-CM

## 2020-09-18 DIAGNOSIS — Z801 Family history of malignant neoplasm of trachea, bronchus and lung: Secondary | ICD-10-CM | POA: Insufficient documentation

## 2020-09-18 DIAGNOSIS — Z79899 Other long term (current) drug therapy: Secondary | ICD-10-CM | POA: Insufficient documentation

## 2020-09-18 DIAGNOSIS — Z9221 Personal history of antineoplastic chemotherapy: Secondary | ICD-10-CM | POA: Diagnosis not present

## 2020-09-18 DIAGNOSIS — G4733 Obstructive sleep apnea (adult) (pediatric): Secondary | ICD-10-CM | POA: Diagnosis not present

## 2020-09-18 DIAGNOSIS — Z8249 Family history of ischemic heart disease and other diseases of the circulatory system: Secondary | ICD-10-CM | POA: Diagnosis not present

## 2020-09-18 DIAGNOSIS — Z923 Personal history of irradiation: Secondary | ICD-10-CM | POA: Diagnosis not present

## 2020-09-18 DIAGNOSIS — Z833 Family history of diabetes mellitus: Secondary | ICD-10-CM | POA: Diagnosis not present

## 2020-09-18 DIAGNOSIS — Z7952 Long term (current) use of systemic steroids: Secondary | ICD-10-CM | POA: Diagnosis not present

## 2020-09-18 DIAGNOSIS — Z7901 Long term (current) use of anticoagulants: Secondary | ICD-10-CM | POA: Insufficient documentation

## 2020-09-18 DIAGNOSIS — R569 Unspecified convulsions: Secondary | ICD-10-CM | POA: Diagnosis not present

## 2020-09-18 LAB — CMP (CANCER CENTER ONLY)
ALT: 149 U/L — ABNORMAL HIGH (ref 0–44)
AST: 60 U/L — ABNORMAL HIGH (ref 15–41)
Albumin: 4.1 g/dL (ref 3.5–5.0)
Alkaline Phosphatase: 50 U/L (ref 38–126)
Anion gap: 9 (ref 5–15)
BUN: 13 mg/dL (ref 8–23)
CO2: 24 mmol/L (ref 22–32)
Calcium: 9.6 mg/dL (ref 8.9–10.3)
Chloride: 107 mmol/L (ref 98–111)
Creatinine: 0.78 mg/dL (ref 0.44–1.00)
GFR, Estimated: >60 mL/min (ref >60)
Glucose, Bld: 115 mg/dL — ABNORMAL HIGH (ref 70–99)
Potassium: 3.9 mmol/L (ref 3.5–5.1)
Sodium: 140 mmol/L (ref 135–145)
Total Bilirubin: 2.2 mg/dL — ABNORMAL HIGH (ref 0.3–1.2)
Total Protein: 6.8 g/dL (ref 6.5–8.1)

## 2020-09-18 LAB — CBC WITH DIFFERENTIAL (CANCER CENTER ONLY)
Abs Immature Granulocytes: 0.03 10*3/uL (ref 0.00–0.07)
Basophils Absolute: 0 10*3/uL (ref 0.0–0.1)
Basophils Relative: 1 %
Eosinophils Absolute: 0 10*3/uL (ref 0.0–0.5)
Eosinophils Relative: 0 %
HCT: 41.4 % (ref 36.0–46.0)
Hemoglobin: 15 g/dL (ref 12.0–15.0)
Immature Granulocytes: 1 %
Lymphocytes Relative: 7 %
Lymphs Abs: 0.3 10*3/uL — ABNORMAL LOW (ref 0.7–4.0)
MCH: 35.4 pg — ABNORMAL HIGH (ref 26.0–34.0)
MCHC: 36.2 g/dL — ABNORMAL HIGH (ref 30.0–36.0)
MCV: 97.6 fL (ref 80.0–100.0)
Monocytes Absolute: 0.2 10*3/uL (ref 0.1–1.0)
Monocytes Relative: 4 %
Neutro Abs: 3.7 10*3/uL (ref 1.7–7.7)
Neutrophils Relative %: 87 %
Platelet Count: 96 10*3/uL — ABNORMAL LOW (ref 150–400)
RBC: 4.24 MIL/uL (ref 3.87–5.11)
RDW: 13.2 % (ref 11.5–15.5)
WBC Count: 4.3 10*3/uL (ref 4.0–10.5)
nRBC: 0 % (ref 0.0–0.2)

## 2020-09-18 MED ORDER — DEXAMETHASONE 1 MG PO TABS
3.0000 mg | ORAL_TABLET | Freq: Every day | ORAL | 1 refills | Status: DC
Start: 2020-09-18 — End: 2020-11-05

## 2020-09-18 MED FILL — DEXAMETHASONE 1 MG TABLET: 1 | 30 days supply | Qty: 90 | Fill #0

## 2020-09-18 NOTE — Progress Notes (Signed)
[facility] at [place], [place] [address] [phone]  Interval Evaluation  Date of Service: 09/18/20 Patient Name: Terry Bell Patient MRN: [id number] Patient DOB: [birth year] Provider: Ventura Sellers, MD  Identifying Statement:  Terry Bell is a 64 y.o. female with right temporal glioblastoma   Referring Provider: Martinique, Betty G, MD [place],  [place] [address]  Oncologic History: Oncology History  Glioblastoma multiforme of temporal lobe [facility])  04/19/2020 Surgery   Craniotomy, right temporal resection with Dr. Christella Noa   05/28/2020 - 07/09/2020 Radiation Therapy   IMRT with concurrent Temodar 11m/m2   05/29/2020 -  Chemotherapy   The patient had temozolomide (TEMODAR) 140 MG capsule, 150 mg/m2/day = 280 mg (100 % of original dose 150 mg/m2/day), Oral, Daily, 0 of 1 cycle, Start date: 07/30/2020, End date: -- Dose modification: 150 mg/m2/day (original dose 150 mg/m2/day, Cycle 1)  for chemotherapy treatment.      Biomarkers:  MGMT Methylated.  IDH 1/2 Wild type.  EGFR Not expressed  TERT "Mutated   Interval History:  Terry WISSINKpresents today after completing first cycle of adjuvant Temodar.  No issues tolerating the chemo.  She did experience an episode of "waking up with left arm weakness" which resolved after several hours.  No other new or progressive deficits. She continues to ambulate with a walker due to left sided weakness, but this is modestly improved from prior.  She has been taking the Keppra 5012mtwice per day, decadron 55m71maily.    H+P (04/26/20) Patient presented last week with several days of progressive new-onset frontal headache.  This may be have been associated with change in taste, but no other neurologic complaints.  CNS imaging demonstrated a large right temporal mass.  She underwent resection on 04/19/20 with Dr. CabChristella Noaas discharged on 6/27.  She has no complaints today  aside from some residual pain at the surgical site.  Does complain of "hole in vision" on the left side, but otherwise no neurologic deficits.  Taking decadron 55mg61mice per day.  Medications: Current Outpatient Medications on File Prior to Visit  Medication Sig Dispense Refill  . Cholecalciferol (VITAMIN D) 1000 UNITS capsule Take 1,000 Units by mouth 2 (two) times daily.      . deMarland Kitchenamethasone (DECADRON) 2 MG tablet Take by mouth.    . levETIRAcetam (KEPPRA) 500 MG tablet Take 1 tablet (500 mg total) by mouth 2 (two) times daily. 60 tablet 3  . pantoprazole (PROTONIX) 40 MG tablet Take 1 tablet (40 mg total) by mouth daily. 30 tablet 3  . Probiotic Product (ALIGN) 4 MG CAPS Take 4 mg by mouth daily.     . rivaroxaban (XARELTO) 20 MG TABS tablet Take 1 tablet (20 mg total) by mouth daily with supper. 30 tablet 3  . ondansetron (ZOFRAN) 8 MG tablet Take 1 tablet (8 mg total) by mouth 2 (two) times daily as needed (nausea and vomiting). May take 30-60 minutes prior to Temodar administration if nausea/vomiting occurs. (Patient not taking: Reported on 08/20/2020) 30 tablet 1  . polyethylene glycol (MIRALAX / GLYCOLAX) 17 g packet Take 17 g by mouth daily. (Patient not taking: Reported on 08/20/2020)    . temozolomide (TEMODAR) 140 MG capsule Take 2 capsules (280 mg total) by mouth daily. May take on an empty stomach to decrease nausea & vomiting. (Patient not taking: Reported on 08/20/2020) 10 capsule 0   No current facility-administered medications  on file prior to visit.    Allergies:  Allergies  Allergen Reactions  . Niacin Hives and Rash   Past Medical History:  Past Medical History:  Diagnosis Date  . Arthritis   . Chronic nonalcoholic liver disease   . Complication of anesthesia   . Continuous leakage of urine   . Depression    situational  . Diverticulosis   . OSA (obstructive sleep apnea) 07/22/2016  . PONV (postoperative nausea and vomiting)   . Steatohepatitis   . Tinnitus    . Varicose veins    Past Surgical History:  Past Surgical History:  Procedure Laterality Date  . APPLICATION OF CRANIAL NAVIGATION N/A 04/19/2020   Procedure: APPLICATION OF CRANIAL NAVIGATION;  Surgeon: Ashok Pall, MD;  Location: [facility];  Service: Neurosurgery;  Laterality: N/A;  . arthroscopic left knee sugery    . bilateral bunionectomy    . BREAST BIOPSY Left   . CHOLECYSTECTOMY    . colonsocopy    . CRANIOTOMY Right 04/19/2020   Procedure: Right Temporal craniotomy for tumor resection;  Surgeon: Ashok Pall, MD;  Location: [facility];  Service: Neurosurgery;  Laterality: Right;  . debridement and removal and lateral release of torn cartlidge in rt knee    . gum grating    . rt knee arthroscopy    . torn cartledge in rt knee     Social History:  Social History   Socioeconomic History  . Marital status: Married    Spouse name: Not on file  . Number of children: Not on file  . Years of education: Not on file  . Highest education level: Not on file  Occupational History  . 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 Smoking status: Never Smoker  . Smokeless tobacco: Never Used  Substance and Sexual Activity  . Alcohol use: No  . Drug use: No  . Sexual activity: Not on file  Other Topics Concern  . 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 Difficulty of Paying Living Expenses: Not on file  Food Insecurity:   . Worried About Charity fundraiser in the Last Year: Not on file  . Ran Out of Food in the Last Year: Not on file  Transportation Needs:   . Lack of Transportation (Medical): Not on file  . Lack of Transportation (Non-Medical): Not on file  Physical Activity:   . Days of Exercise per Week: Not on file  . Minutes of Exercise per Session: Not on file  Stress:   . Feeling of Stress : Not on file  Social Connections:    . Frequency of Communication with Friends and Family: Not on file  . Frequency of Social Gatherings with Friends and Family: Not on file  . Attends Religious Services: Not on file  . Active Member of Clubs or Organizations: Not on file  . Attends Archivist Meetings: Not on file  . Marital Status: Not on file  Intimate Partner Violence:   . Fear of Current or Ex-Partner: Not on file  . Emotionally Abused: Not on file  . Physically Abused: Not on file  . Sexually Abused: Not on file   Family History:  Family History  Problem Relation Age of Onset  . Diabetes Mother   . Liver disease Mother   .  Diabetes Sister   . Liver disease Sister   . Hypertension Other   . Cancer Other   . Hemochromatosis Other   . Cancer Father        Throat  . Hypertension Brother   . Colon cancer Neg Hx   . Breast cancer Neg Hx     Review of Systems: Constitutional: Doesn't report fevers, chills or abnormal weight loss Eyes: Doesn't report blurriness of vision Ears, nose, mouth, throat, and face: Doesn't report sore throat Respiratory: Doesn't report cough, dyspnea or wheezes Cardiovascular: Doesn't report palpitation, chest discomfort  Gastrointestinal:  Doesn't report nausea, constipation, diarrhea GU: Doesn't report incontinence Skin: Doesn't report skin rashes Neurological: Per HPI Musculoskeletal: Doesn't report joint pain Behavioral/Psych: Doesn't report anxiety  Physical Exam: Vitals:   09/18/20 1207  BP: 119/77  Pulse: (!) 58  Resp: 18  Temp: (!) 97.1 F (36.2 C)  SpO2: 100%   KPS: 80. General: Alert, cooperative, pleasant, in no acute distress Head: Normal EENT: No conjunctival injection or scleral icterus.  Lungs: Resp effort normal Cardiac: Regular rate Abdomen: Non-distended abdomen Skin: No rashes cyanosis or petechiae. Extremities: No clubbing or edema  Neurologic Exam: Mental Status: Awake, alert, attentive to examiner. Oriented to self and environment.  Language is fluent with intact comprehension.  Mild motor and sensory neglect.  Cranial Nerves: Visual acuity is grossly normal. Left hemianopia. Extra-ocular movements intact. No ptosis. Face is symmetric Motor: Tone and bulk are normal. Power is 4+/5 in left arm and leg. Reflexes are symmetric, no pathologic reflexes present.  Sensory: Extinguishes left leg and arm. Gait: Deferred   Labs: I have reviewed the data as listed    Component Value Date/Time   NA 140 09/18/2020 1123   K 3.9 09/18/2020 1123   CL 107 09/18/2020 1123   CO2 24 09/18/2020 1123   GLUCOSE 115 (H) 09/18/2020 1123   BUN 13 09/18/2020 1123   CREATININE 0.78 09/18/2020 1123   CALCIUM 9.6 09/18/2020 1123   PROT 6.8 09/18/2020 1123   ALBUMIN 4.1 09/18/2020 1123   AST 60 (H) 09/18/2020 1123   ALT 149 (H) 09/18/2020 1123   ALKPHOS 50 09/18/2020 1123   BILITOT 2.2 (H) 09/18/2020 1123   GFRNONAA >60 09/18/2020 1123   GFRAA >60 07/30/2020 1202   Lab Results  Component Value Date   WBC 4.3 09/18/2020   NEUTROABS 3.7 09/18/2020   HGB 15.0 09/18/2020   HCT 41.4 09/18/2020   MCV 97.6 09/18/2020   PLT 96 (L) 09/18/2020    Assessment/Plan Glioblastoma multiforme of temporal lobe ([place]) [C71.2]   KIELYN KARDELL is clinically stable today.  Labs demonstrate slight bump in liver enzymes and mild thrombocytopenia.  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  We recommended continuing treatment with cycle #2 Temozolomide 150 mg/m2, on for five days and off for twenty three days in twenty eight day cycles. The patient will have a complete blood count performed on days 21 and 28 of each cycle, and a comprehensive metabolic panel performed on day 28 of each cycle. Labs may need to be performed more often. Zofran will prescribed for home use for nausea/vomiting.   Cycle #2 should be initiated the week of 12/6 given laboratory abnormalities today.  Dose will not be escalated  to 2108m/m2 level.  Chemotherapy should be held for the following:  ANC less than 1,000  Platelets less than 100,000  LFT or creatinine greater than 2x ULN  If clinical  concerns/contraindications develop  Decadron should decrease to 29m daily x1 week, then 254mdaily thereafter if tolerated  Keppra should be continued at 50066mID, can increase if events recur again.  We ask that DonRUDINE RIEGERturn to clinic in 1 month, prior to cycle #3, with MRI brain for evaluation.  All questions were answered. The patient knows to call the clinic with any problems, questions or concerns. No barriers to learning were detected.  I have spent a total of 30 minutes of face-to-face and non-face-to-face time, excluding clinical staff time, preparing to see patient, ordering tests and/or medications, counseling the patient, and documenting clinical information in the electronic or other health record    ZacVentura SellersD Medical Director of Neuro-Oncology [place]/23/21 12:38 PM

## 2020-09-24 MED FILL — XARELTO 20 MG TABLET: 20 | 30 days supply | Qty: 30 | Fill #1

## 2020-09-27 ENCOUNTER — Other Ambulatory Visit: Payer: Self-pay | Admitting: Internal Medicine

## 2020-09-27 DIAGNOSIS — C712 Malignant neoplasm of temporal lobe: Secondary | ICD-10-CM

## 2020-09-27 MED ORDER — TEMOZOLOMIDE 140 MG PO CAPS: 150.0000 mg/m2/d | ORAL_CAPSULE | Freq: Every day | ORAL | 0 refills | Status: DC

## 2020-09-27 MED FILL — ONDANSETRON HCL 8 MG TABLET: 8 | 15 days supply | Qty: 30 | Fill #1

## 2020-09-28 MED FILL — TEMOZOLOMIDE 140 MG CAPS: 140 | 5 days supply | Qty: 10 | Fill #0

## 2020-09-30 DIAGNOSIS — C711 Malignant neoplasm of frontal lobe: Secondary | ICD-10-CM | POA: Diagnosis not present

## 2020-10-01 MED FILL — PANTOPRAZOLE SOD DR 40 MG T: 40 | 30 days supply | Qty: 30 | Fill #1

## 2020-10-01 MED FILL — levETIRAcetam 500 MG TABS: 500 | 30 days supply | Qty: 60 | Fill #2

## 2020-10-02 ENCOUNTER — Other Ambulatory Visit: Payer: Self-pay | Admitting: Radiation Therapy

## 2020-10-07 ENCOUNTER — Encounter: Payer: Self-pay | Admitting: Internal Medicine

## 2020-10-08 ENCOUNTER — Other Ambulatory Visit: Payer: Self-pay | Admitting: Internal Medicine

## 2020-10-08 MED ORDER — FUROSEMIDE 20 MG PO TABS: 20.0000 mg | ORAL_TABLET | Freq: Every day | ORAL | 1 refills | Status: DC

## 2020-10-08 MED FILL — FUROSEMIDE 20 MG TABS: 20 | 30 days supply | Qty: 30 | Fill #0

## 2020-10-18 MED FILL — XARELTO 20 MG TABLET: 20 | 30 days supply | Qty: 30 | Fill #2

## 2020-10-25 ENCOUNTER — Telehealth: Payer: Self-pay

## 2020-10-25 ENCOUNTER — Ambulatory Visit (HOSPITAL_COMMUNITY)
Admission: RE | Admit: 2020-10-25 | Discharge: 2020-10-25 | Disposition: A | Discharge status: Home / Self Care | Payer: 59 | Source: Ambulatory Visit | Attending: Internal Medicine | Admitting: Internal Medicine

## 2020-10-25 ENCOUNTER — Other Ambulatory Visit: Payer: Self-pay | Admitting: Internal Medicine

## 2020-10-25 ENCOUNTER — Other Ambulatory Visit: Payer: Self-pay

## 2020-10-25 DIAGNOSIS — C712 Malignant neoplasm of temporal lobe: Secondary | ICD-10-CM | POA: Diagnosis not present

## 2020-10-25 DIAGNOSIS — G9389 Other specified disorders of brain: Secondary | ICD-10-CM | POA: Diagnosis not present

## 2020-10-25 DIAGNOSIS — D496 Neoplasm of unspecified behavior of brain: Secondary | ICD-10-CM | POA: Diagnosis not present

## 2020-10-25 DIAGNOSIS — I615 Nontraumatic intracerebral hemorrhage, intraventricular: Secondary | ICD-10-CM | POA: Diagnosis not present

## 2020-10-25 DIAGNOSIS — H748X3 Other specified disorders of middle ear and mastoid, bilateral: Secondary | ICD-10-CM | POA: Diagnosis not present

## 2020-10-25 MED ORDER — GADOBUTROL 1 MMOL/ML IV SOLN
7.0000 mL | Freq: Once | INTRAVENOUS | Status: AC | PRN
  Administered 2020-10-25: 7 mL via INTRAVENOUS

## 2020-10-25 NOTE — Telephone Encounter (Signed)
Received a call from [facility] with [facility] regarding the Patients recent MRI. Increased volume and enhancing soft tissue in the right temporal parietal region concerning for tumor progression. Results were printed and communicated to Dr. Barbaraann Cao.

## 2020-10-25 NOTE — Progress Notes (Signed)
[facility] - Neuro  No Change  Continue With Treatment as Ordered.  Original Decision Date/Time: 05/17/2020 16:50     One cycle, concurrent with RT:     Temozolomide   **Always confirm dose/schedule in your pharmacy ordering system**  Patient Characteristics: Glioblastoma (Grade IV Glioma), Newly Diagnosed / Treatment Naive, Good Performance Status and/or Younger Patient, MGMT Promoter Unmethylated/Unknown Disease Classification: Glioma Disease Classification: Glioblastoma (Grade IV Glioma) Disease Status: Newly Diagnosed / Treatment Naive Performance Status: Good Performance Status and/or Younger Patient MGMT Promoter Methylation Status: Awaiting Test Results Intent of Therapy: Non-Curative / Palliative Intent, Discussed with Patient

## 2020-10-29 ENCOUNTER — Telehealth: Payer: Self-pay | Admitting: *Deleted

## 2020-10-29 ENCOUNTER — Telehealth: Payer: Self-pay | Admitting: Internal Medicine

## 2020-10-29 MED FILL — levETIRAcetam 500 MG TABS: 500 | 30 days supply | Qty: 60 | Fill #3

## 2020-10-29 NOTE — Telephone Encounter (Signed)
Scheduled infusion appointment per 1/3 schedule message. Patient's husband is aware.

## 2020-10-29 NOTE — Telephone Encounter (Signed)
Spoke with patients husband Terry Bell about patients increasing headaches.  He stated that he gave patient the following dosing in Decadron.  Friday 5m (am) 4 mg (pm) Sat 8 mg Sun 8110mMon 4 mg  And he has seen improvement in her headaches and worsening left sided weakness.  Communicated to Dr VaMickeal Skinnerhange in medication.

## 2020-10-31 DIAGNOSIS — C711 Malignant neoplasm of frontal lobe: Secondary | ICD-10-CM | POA: Diagnosis not present

## 2020-10-31 NOTE — Progress Notes (Signed)
.  The following biosimilar Mvasi (bevacizumab-awwb) has been selected for use in this patient per insurance.  Henreitta Leber, PharmD

## 2020-11-01 ENCOUNTER — Inpatient Hospital Stay: Payer: 59 | Admitting: Internal Medicine

## 2020-11-01 ENCOUNTER — Inpatient Hospital Stay: Payer: 59

## 2020-11-02 ENCOUNTER — Encounter: Payer: Self-pay | Admitting: Physical Therapy

## 2020-11-02 NOTE — Therapy (Signed)
[facility] [address] [place], [place], [address] Phone: [phone]   Fax:  [phone]  Patient Details  Name: GRACY EHLY MRN: [id number] Date of Birth: [birth year] Referring Provider:  No ref. provider found  Encounter Date: 11/02/2020  PHYSICAL THERAPY DISCHARGE SUMMARY  Visits from Start of Care: 2  Unable to assess goals - all future PT appts cancelled due to pt receiving home health therapies.  Plan: Patient agrees to discharge.  Patient goals were not met. Patient is being discharged due to a change in medical status.  ?????       Arliss Journey, PT, DPT 11/02/2020, 2:13 PM  [facility] [address] [place], [place], [address] Phone: [phone]   Fax:  [phone]

## 2020-11-05 ENCOUNTER — Other Ambulatory Visit: Payer: Self-pay | Admitting: Internal Medicine

## 2020-11-05 ENCOUNTER — Inpatient Hospital Stay: Payer: 59

## 2020-11-05 ENCOUNTER — Other Ambulatory Visit: Payer: Self-pay

## 2020-11-05 ENCOUNTER — Inpatient Hospital Stay (HOSPITAL_BASED_OUTPATIENT_CLINIC_OR_DEPARTMENT_OTHER): Payer: 59 | Admitting: Internal Medicine

## 2020-11-05 ENCOUNTER — Other Ambulatory Visit: Payer: Self-pay | Admitting: Radiation Oncology

## 2020-11-05 ENCOUNTER — Inpatient Hospital Stay: Payer: 59 | Attending: Internal Medicine

## 2020-11-05 VITALS — BP 136/90 | HR 62 | Temp 97.8°F | Resp 17 | Ht 66.0 in | Wt 167.7 lb

## 2020-11-05 VITALS — BP 151/91 | HR 58 | Temp 97.9°F

## 2020-11-05 DIAGNOSIS — Z5112 Encounter for antineoplastic immunotherapy: Secondary | ICD-10-CM | POA: Insufficient documentation

## 2020-11-05 DIAGNOSIS — K7581 Nonalcoholic steatohepatitis (NASH): Secondary | ICD-10-CM | POA: Insufficient documentation

## 2020-11-05 DIAGNOSIS — Z8249 Family history of ischemic heart disease and other diseases of the circulatory system: Secondary | ICD-10-CM | POA: Insufficient documentation

## 2020-11-05 DIAGNOSIS — C712 Malignant neoplasm of temporal lobe: Secondary | ICD-10-CM

## 2020-11-05 DIAGNOSIS — Z7901 Long term (current) use of anticoagulants: Secondary | ICD-10-CM | POA: Insufficient documentation

## 2020-11-05 DIAGNOSIS — Z833 Family history of diabetes mellitus: Secondary | ICD-10-CM | POA: Diagnosis not present

## 2020-11-05 DIAGNOSIS — Z923 Personal history of irradiation: Secondary | ICD-10-CM | POA: Diagnosis not present

## 2020-11-05 DIAGNOSIS — Z801 Family history of malignant neoplasm of trachea, bronchus and lung: Secondary | ICD-10-CM | POA: Diagnosis not present

## 2020-11-05 DIAGNOSIS — Z800 Family history of malignant neoplasm of digestive organs: Secondary | ICD-10-CM | POA: Insufficient documentation

## 2020-11-05 DIAGNOSIS — Z7952 Long term (current) use of systemic steroids: Secondary | ICD-10-CM | POA: Insufficient documentation

## 2020-11-05 DIAGNOSIS — Z9049 Acquired absence of other specified parts of digestive tract: Secondary | ICD-10-CM | POA: Insufficient documentation

## 2020-11-05 DIAGNOSIS — R569 Unspecified convulsions: Secondary | ICD-10-CM | POA: Diagnosis not present

## 2020-11-05 DIAGNOSIS — Z79899 Other long term (current) drug therapy: Secondary | ICD-10-CM | POA: Diagnosis not present

## 2020-11-05 DIAGNOSIS — G4733 Obstructive sleep apnea (adult) (pediatric): Secondary | ICD-10-CM | POA: Diagnosis not present

## 2020-11-05 LAB — CBC WITH DIFFERENTIAL (CANCER CENTER ONLY)
Abs Immature Granulocytes: 0.09 10*3/uL — ABNORMAL HIGH (ref 0.00–0.07)
Basophils Absolute: 0 10*3/uL (ref 0.0–0.1)
Basophils Relative: 0 %
Eosinophils Absolute: 0 10*3/uL (ref 0.0–0.5)
Eosinophils Relative: 0 %
HCT: 43.7 % (ref 36.0–46.0)
Hemoglobin: 15.7 g/dL — ABNORMAL HIGH (ref 12.0–15.0)
Immature Granulocytes: 2 %
Lymphocytes Relative: 6 %
Lymphs Abs: 0.4 10*3/uL — ABNORMAL LOW (ref 0.7–4.0)
MCH: 35.4 pg — ABNORMAL HIGH (ref 26.0–34.0)
MCHC: 35.9 g/dL (ref 30.0–36.0)
MCV: 98.6 fL (ref 80.0–100.0)
Monocytes Absolute: 0.2 10*3/uL (ref 0.1–1.0)
Monocytes Relative: 4 %
Neutro Abs: 5.3 10*3/uL (ref 1.7–7.7)
Neutrophils Relative %: 88 %
Platelet Count: 98 10*3/uL — ABNORMAL LOW (ref 150–400)
RBC: 4.43 MIL/uL (ref 3.87–5.11)
RDW: 12.7 % (ref 11.5–15.5)
WBC Count: 5.9 10*3/uL (ref 4.0–10.5)
nRBC: 0 % (ref 0.0–0.2)

## 2020-11-05 LAB — CMP (CANCER CENTER ONLY)
ALT: 208 U/L — ABNORMAL HIGH (ref 0–44)
AST: 66 U/L — ABNORMAL HIGH (ref 15–41)
Albumin: 4 g/dL (ref 3.5–5.0)
Alkaline Phosphatase: 43 U/L (ref 38–126)
Anion gap: 7 (ref 5–15)
BUN: 16 mg/dL (ref 8–23)
CO2: 20 mmol/L — ABNORMAL LOW (ref 22–32)
Calcium: 9.7 mg/dL (ref 8.9–10.3)
Chloride: 108 mmol/L (ref 98–111)
Creatinine: 0.71 mg/dL (ref 0.44–1.00)
GFR, Estimated: >60 mL/min (ref >60)
Glucose, Bld: 152 mg/dL — ABNORMAL HIGH (ref 70–99)
Potassium: 4.1 mmol/L (ref 3.5–5.1)
Sodium: 135 mmol/L (ref 135–145)
Total Bilirubin: 1.9 mg/dL — ABNORMAL HIGH (ref 0.3–1.2)
Total Protein: 6.8 g/dL (ref 6.5–8.1)

## 2020-11-05 MED ORDER — DEXAMETHASONE 4 MG PO TABS: 4.0000 mg | ORAL_TABLET | Freq: Two times a day (BID) | ORAL | 1 refills | Status: DC

## 2020-11-05 MED ORDER — SODIUM CHLORIDE 0.9 % IV SOLN
Freq: Once | INTRAVENOUS | Status: AC
  Filled 2020-11-05: qty 250

## 2020-11-05 MED ORDER — SODIUM CHLORIDE 0.9 % IV SOLN
10.0000 mg/kg | Freq: Once | INTRAVENOUS | Status: AC
  Administered 2020-11-05: 700 mg via INTRAVENOUS
  Filled 2020-11-05: qty 16

## 2020-11-05 MED FILL — DEXAMETHASONE 4 MG TABLET: 4 | 30 days supply | Qty: 60 | Fill #0

## 2020-11-05 MED FILL — FUROSEMIDE 20 MG TABS: 20 | 30 days supply | Qty: 30 | Fill #1

## 2020-11-05 NOTE — Progress Notes (Signed)
Per Dr. Mickeal Skinner, pt does not need a urine protein today for treatment. This information was also verbally relayed to infusion nurse Lexine Baton.

## 2020-11-05 NOTE — Progress Notes (Signed)
[facility] [place], [place] [address] [phone]  Interval Evaluation  Date of Service: 11/05/20 Patient Name: Terry Bell Patient MRN: [id number] Patient DOB: Jan 07, 1956 Provider: Ventura Sellers, MD  Identifying Statement:  Terry Bell is a 65 y.o. female with right temporal glioblastoma   Referring Provider: Martinique, Betty G, MD [place],  [place] [address]  Oncologic History: Oncology History  Glioblastoma multiforme of temporal lobe ([place])  04/19/2020 Surgery   Craniotomy, right temporal resection with Dr. Christella Noa   05/28/2020 - 07/09/2020 Radiation Therapy   IMRT with concurrent Temodar 27m/m2   05/29/2020 -  Chemotherapy   The patient had temozolomide (TEMODAR) 140 MG capsule, 150 mg/m2/day = 280 mg, Oral, Daily, 0 of 1 cycle, Start date: 09/27/2020, End date: -- Dose modification: 150 mg/m2/day (original dose 150 mg/m2/day, Cycle 1)  for chemotherapy treatment.    11/05/2020 -  Chemotherapy   The patient had bevacizumab-awwb (MVASI) 700 mg in sodium chloride 0.9 % 100 mL chemo infusion, 10 mg/kg = 700 mg, Intravenous,  Once, 0 of 6 cycles  for chemotherapy treatment.      Biomarkers:  MGMT Methylated.  IDH 1/2 Wild type.  EGFR Not expressed  TERT "Mutated   Interval History:  Terry NONAKApresents today after completing second cycle of adjuvant Temodar.  She and her husband describe notable increase in burden of left sided weakness since one week prior.  She is having more difficulty ambulating with the walker, requiring help for more ADLs. Left arm also is weaker than before.  She has been taking the Keppra 5020mtwice per day, decadron 47m75maily.  She had felt improvement while on the 8mg82mily decadron dose over the weekend, but that benefit has receded.  Fortunately the swelling in her left lower leg is considerably improved with the lasix.  H+P (04/26/20) Patient presented last week with  several days of progressive new-onset frontal headache.  This may be have been associated with change in taste, but no other neurologic complaints.  CNS imaging demonstrated a large right temporal mass.  She underwent resection on 04/19/20 with Dr. CabbChristella Noas discharged on 6/27.  She has no complaints today aside from some residual pain at the surgical site.  Does complain of "hole in vision" on the left side, but otherwise no neurologic deficits.  Taking decadron 47mg 101mce per day.  Medications: Current Outpatient Medications on File Prior to Visit  Medication Sig Dispense Refill  . acetaminophen (TYLENOL) 325 MG tablet Take 650 mg by mouth every 6 (six) hours as needed. Per pt, not to exceed 2G in a given day    . Cholecalciferol (VITAMIN D) 1000 UNITS capsule Take 1,000 Units by mouth 2 (two) times daily.      . dexMarland Kitchenmethasone (DECADRON) 1 MG tablet Take 3 tablets (3 mg total) by mouth daily. 90 tablet 1  . furosemide (LASIX) 20 MG tablet Take 1 tablet (20 mg total) by mouth daily. 30 tablet 1  . levETIRAcetam (KEPPRA) 500 MG tablet Take 1 tablet (500 mg total) by mouth 2 (two) times daily. 60 tablet 3  . ondansetron (ZOFRAN) 8 MG tablet Take 1 tablet (8 mg total) by mouth 2 (two) times daily as needed (nausea and vomiting). May take 30-60 minutes prior to Temodar administration if nausea/vomiting occurs. 30 tablet 1  . pantoprazole (PROTONIX) 40 MG tablet Take 1 tablet (40 mg total) by mouth daily. 30Geraldine  tablet 3  . polyethylene glycol (MIRALAX / GLYCOLAX) 17 g packet Take 17 g by mouth daily.    . Probiotic Product (ALIGN) 4 MG CAPS Take 4 mg by mouth daily.     . rivaroxaban (XARELTO) 20 MG TABS tablet Take 1 tablet (20 mg total) by mouth daily with supper. 30 tablet 3  . temozolomide (TEMODAR) 140 MG capsule Take 2 capsules (280 mg total) by mouth daily. May take on an empty stomach to decrease nausea & vomiting. 10 capsule 0   No current facility-administered medications on file prior to visit.     Allergies:  Allergies  Allergen Reactions  . Niacin Hives and Rash   Past Medical History:  Past Medical History:  Diagnosis Date  . Arthritis   . Chronic nonalcoholic liver disease   . Complication of anesthesia   . Continuous leakage of urine   . Depression    situational  . Diverticulosis   . OSA (obstructive sleep apnea) 07/22/2016  . PONV (postoperative nausea and vomiting)   . Steatohepatitis   . Tinnitus   . Varicose veins    Past Surgical History:  Past Surgical History:  Procedure Laterality Date  . APPLICATION OF CRANIAL NAVIGATION N/A 04/19/2020   Procedure: APPLICATION OF CRANIAL NAVIGATION;  Surgeon: Ashok Pall, MD;  Location: [facility];  Service: Neurosurgery;  Laterality: N/A;  . arthroscopic left knee sugery    . bilateral bunionectomy    . BREAST BIOPSY Left   . CHOLECYSTECTOMY    . colonsocopy    . CRANIOTOMY Right 04/19/2020   Procedure: Right Temporal craniotomy for tumor resection;  Surgeon: Ashok Pall, MD;  Location: [facility];  Service: Neurosurgery;  Laterality: Right;  . debridement and removal and lateral release of torn cartlidge in rt knee    . gum grating    . rt knee arthroscopy    . torn cartledge in rt knee     Social History:  Social History   Socioeconomic History  . Marital status: Married    Spouse name: Not on file  . Number of children: Not on file  . Years of education: Not on file  . Highest education level: Not on file  Occupational History  . 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 Smoking status: Never Smoker  . Smokeless tobacco: Never Used  Substance and Sexual Activity  . Alcohol use: No  . Drug use: No  . Sexual activity: Not on file  Other Topics Concern  . 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 [employer]      Regular exercise, healthy diet   Social Determinants of Health   Financial Resource Strain: Not on file  Food Insecurity: Not on file  Transportation Needs:  Not on file  Physical Activity: Not on file  Stress: Not on file  Social Connections: Not on file  Intimate Partner Violence: Not on file   Family History:  Family History  Problem Relation Age of Onset  . Diabetes Mother   . Liver disease Mother   . Diabetes Sister   . Liver disease Sister   . Hypertension Other   . Cancer Other   . Hemochromatosis Other   . Cancer Father        Throat  . Hypertension Brother   . Colon cancer Neg Hx   . Breast cancer Neg Hx     Review of Systems: Constitutional: Doesn't report fevers, chills or  abnormal weight loss Eyes: Doesn't report blurriness of vision Ears, nose, mouth, throat, and face: Doesn't report sore throat Respiratory: Doesn't report cough, dyspnea or wheezes Cardiovascular: Doesn't report palpitation, chest discomfort  Gastrointestinal:  Doesn't report nausea, constipation, diarrhea GU: Doesn't report incontinence Skin: Doesn't report skin rashes Neurological: Per HPI Musculoskeletal: Doesn't report joint pain Behavioral/Psych: Doesn't report anxiety  Physical Exam: Vitals:   11/05/20 1225  BP: 136/90  Pulse: 62  Resp: 17  Temp: 97.8 F (36.6 C)  SpO2: 99%   KPS: 60. General: Alert, cooperative, pleasant, in no acute distress Head: Normal EENT: No conjunctival injection or scleral icterus.  Lungs: Resp effort normal Cardiac: Regular rate Abdomen: Non-distended abdomen Skin: No rashes cyanosis or petechiae. Extremities: No clubbing or edema  Neurologic Exam: Mental Status: Awake, alert, attentive to examiner. Oriented to self and environment. Language is fluent with intact comprehension.  Mild motor and sensory neglect.  Cranial Nerves: Visual acuity is grossly normal. Left hemianopia. Extra-ocular movements intact. No ptosis. Face is symmetric Motor: Tone and bulk are normal. Power is 4/5 in left arm and leg, with fine motor dystaxia. Reflexes are symmetric, no pathologic reflexes present.  Sensory:  Extinguishes left leg and arm. Gait: Deferred   Labs: I have reviewed the data as listed    Component Value Date/Time   NA 135 11/05/2020 1149   K 4.1 11/05/2020 1149   CL 108 11/05/2020 1149   CO2 20 (L) 11/05/2020 1149   GLUCOSE 152 (H) 11/05/2020 1149   BUN 16 11/05/2020 1149   CREATININE 0.71 11/05/2020 1149   CALCIUM 9.7 11/05/2020 1149   PROT 6.8 11/05/2020 1149   ALBUMIN 4.0 11/05/2020 1149   AST 66 (H) 11/05/2020 1149   ALT 208 (H) 11/05/2020 1149   ALKPHOS 43 11/05/2020 1149   BILITOT 1.9 (H) 11/05/2020 1149   GFRNONAA >60 11/05/2020 1149   GFRAA >60 07/30/2020 1202   Lab Results  Component Value Date   WBC 5.9 11/05/2020   NEUTROABS 5.3 11/05/2020   HGB 15.7 (H) 11/05/2020   HCT 43.7 11/05/2020   MCV 98.6 11/05/2020   PLT 98 (L) 11/05/2020   Imaging:  [facility] Clinician Interpretation: I have personally reviewed the CNS images as listed.  My interpretation, in the context of the patient's clinical presentation, is progressive disease  MR BRAIN W WO CONTRAST  Result Date: 10/25/2020 CLINICAL DATA:  Outside MRI from July 23, 2020. EXAM: MRI HEAD WITHOUT AND WITH CONTRAST TECHNIQUE: Multiplanar, multiecho pulse sequences of the brain and surrounding structures were obtained without and with intravenous contrast. CONTRAST:  63m GADAVIST GADOBUTROL 1 MMOL/ML IV SOLN COMPARISON:  July 18, 2020. FINDINGS: Brain: Postoperative changes of right parietal craniotomy with subjacent resection cavity. In comparison to 927, 2021, increasing bulk/volume of irregularly enhancing soft tissue surrounding the resection cavity that is centered in the right temporoparietal region but also involving the right frontal, parietal, temporal and occipital lobes. Tumor measures up to 8.3 x 6.0 cm (series 20, image 85). Evidence of prior hemorrhage within the tumor and resection cavity. Tumor extends into the adjacent right lateral ventricle in multiple areas, compatible with subependymal  spread most conspicuous in the posterior right temporal horn (see series 20, image 67). Additionally, there is slight increase in extent of extensive surrounding T2/STIR hyperintense signal abnormality. There is increased associated mass effect with worsening effacement of the right lateral ventricle. There is increased leftward midline, now measuring 8 mm at the foramina MGerlach Shift similar ballooning of the right  temporal horn, compatible with ventricular entrapment. There is small volume layering hemorrhage within the occipital horn of the left lateral ventricle, which appears new from prior. No evidence of acute infarct. Vascular: Major arterial flow voids are maintained at the skull base. Skull and upper cervical spine: No focal marrow replacing process. Sinuses/Orbits: Sinuses are clear.  Unremarkable orbits. Other: Trace bilateral mastoid effusions. IMPRESSION: 1. Relative to July 18, 2020, increased bulk/volume of irregular enhancing soft tissue centered in the right temporoparietal region with increased surrounding T2/FLAIR hyperintensity. Findings are concerning for tumor progression. There are multiple areas of subependymal spread of tumor. 2. Progressive mass effect with effacement of the right lateral ventricle and increased leftward midline shift, now 8 mm. Similar ballooning of the right temporal horn, compatible with ventricular entrapment. 3. Small amount of interval intraventricular hemorrhage within the left occipital horn. These results will be called to the ordering clinician or representative by the Radiologist Assistant, and communication documented in the PACS or [employer]. Electronically Signed   By: Margaretha Sheffield MD   On: 10/25/2020 11:18   Assessment/Plan Glioblastoma multiforme of temporal lobe ([facility]) [C71.2]   Terry Bell is clinically progressive today, with noted decline in left sided motor function since prior visit.  MRI also demonstrates disease progression,  with large enhancing burden throughout much of right temporal/parietal lobes infiltrating into subcortex.  There is increased T2 signal and mass effect as well on most recent scan.  We recommended deferring further Temozolomide due to tumor progression and continued gradual trans-aminitis.  For second line therapy we recommended initiation with single agent IV avastin 37m/kg q2 weeks.  We reviewed side effects including hypertension, bleeding/clotting and wound healing abnormalities.  They understand risk of CNS bleeding event with large enhancing tumor, avastin and concurrent use of anticoagulant.  The potential benefit and limited alternatives make avastin appealing here despite the adverse risk profile.  Avastin should be held for the following:  ANC less than 500  Platelets less than 50,000  LFT or creatinine greater than 2x ULN  If clinical concerns/contraindications develop  In the very short term, decadron can be increased back to 849mdaily, until clinically improved.  We provided information on "DIY" decadron taper, with goal of returning to 36m97mose level or lower before next infusion in 2 weeks.  Keppra should be continued at 500m27mD, can increase if events recur again.  May discontinue lasix or use only PRN.  We ask that DonnDAVEENA ELMOREurn to clinic in 2 weeks with labs for next avastin infusion.   All questions were answered. The patient knows to call the clinic with any problems, questions or concerns. No barriers to learning were detected.  I have spent a total of 40 minutes of face-to-face and non-face-to-face time, excluding clinical staff time, preparing to see patient, ordering tests and/or medications, counseling the patient, and documenting clinical information in the electronic or other health record    ZachVentura Sellers Medical Director of Neuro-Oncology [facility]10/22 12:37 PM

## 2020-11-05 NOTE — Telephone Encounter (Signed)
No I am seeing her today, will change dose regardless  Thedore Mins

## 2020-11-05 NOTE — Patient Instructions (Addendum)
[facility] Discharge Instructions for Patients Receiving Chemotherapy  Today you received the following chemotherapy agents: bevacizumab (Avastin).  To help prevent nausea and vomiting after your treatment, we encourage you to take your nausea medication as directed.   If you develop nausea and vomiting that is not controlled by your nausea medication, call the clinic.   BELOW ARE SYMPTOMS THAT SHOULD BE REPORTED IMMEDIATELY:  *FEVER GREATER THAN 100.5 F  *CHILLS WITH OR WITHOUT FEVER  NAUSEA AND VOMITING THAT IS NOT CONTROLLED WITH YOUR NAUSEA MEDICATION  *UNUSUAL SHORTNESS OF BREATH  *UNUSUAL BRUISING OR BLEEDING  TENDERNESS IN MOUTH AND THROAT WITH OR WITHOUT PRESENCE OF ULCERS  *URINARY PROBLEMS  *BOWEL PROBLEMS  UNUSUAL RASH Items with * indicate a potential emergency and should be followed up as soon as possible.  Feel free to call the clinic should you have any questions or concerns. The clinic phone number is [phone].  Please show the [facility] at check-in to the Emergency Department and triage nurse.  Bevacizumab injection What is this medicine? BEVACIZUMAB (be va SIZ yoo mab) is a monoclonal antibody. It is used to treat many types of cancer. This medicine may be used for other purposes; ask your health care provider or pharmacist if you have questions. COMMON BRAND NAME(S): Avastin, MVASI, Zirabev What should I tell my health care provider before I take this medicine? They need to know if you have any of these conditions:  diabetes  heart disease  high blood pressure  history of coughing up blood  prior anthracycline chemotherapy (e.g., doxorubicin, daunorubicin, epirubicin)  recent or ongoing radiation therapy  recent or planning to have surgery  stroke  an unusual or allergic reaction to bevacizumab, hamster proteins, mouse proteins, other medicines, foods, dyes, or preservatives  pregnant or trying to get  pregnant  breast-feeding How should I use this medicine? This medicine is for infusion into a vein. It is given by a health care professional in a hospital or clinic setting. Talk to your pediatrician regarding the use of this medicine in children. Special care may be needed. Overdosage: If you think you have taken too much of this medicine contact a poison control center or emergency room at once. NOTE: This medicine is only for you. Do not share this medicine with others. What if I miss a dose? It is important not to miss your dose. Call your doctor or health care professional if you are unable to keep an appointment. What may interact with this medicine? Interactions are not expected. This list may not describe all possible interactions. Give your health care provider a list of all the medicines, herbs, non-prescription drugs, or dietary supplements you use. Also tell them if you smoke, drink alcohol, or use illegal drugs. Some items may interact with your medicine. What should I watch for while using this medicine? Your condition will be monitored carefully while you are receiving this medicine. You will need important blood work and urine testing done while you are taking this medicine. This medicine may increase your risk to bruise or bleed. Call your doctor or health care professional if you notice any unusual bleeding. Before having surgery, talk to your health care provider to make sure it is ok. This drug can increase the risk of poor healing of your surgical site or wound. You will need to stop this drug for 28 days before surgery. After surgery, wait at least 28 days before restarting this drug. Make sure the  surgical site or wound is healed enough before restarting this drug. Talk to your health care provider if questions. Do not become pregnant while taking this medicine or for 6 months after stopping it. Women should inform their doctor if they wish to become pregnant or think they  might be pregnant. There is a potential for serious side effects to an unborn child. Talk to your health care professional or pharmacist for more information. Do not breast-feed an infant while taking this medicine and for 6 months after the last dose. This medicine has caused ovarian failure in some women. This medicine may interfere with the ability to have a child. You should talk to your doctor or health care professional if you are concerned about your fertility. What side effects may I notice from receiving this medicine? Side effects that you should report to your doctor or health care professional as soon as possible:  allergic reactions like skin rash, itching or hives, swelling of the face, lips, or tongue  chest pain or chest tightness  chills  coughing up blood  high fever  seizures  severe constipation  signs and symptoms of bleeding such as bloody or black, tarry stools; red or dark-brown urine; spitting up blood or brown material that looks like coffee grounds; red spots on the skin; unusual bruising or bleeding from the eye, gums, or nose  signs and symptoms of a blood clot such as breathing problems; chest pain; severe, sudden headache; pain, swelling, warmth in the leg  signs and symptoms of a stroke like changes in vision; confusion; trouble speaking or understanding; severe headaches; sudden numbness or weakness of the face, arm or leg; trouble walking; dizziness; loss of balance or coordination  stomach pain  sweating  swelling of legs or ankles  vomiting  weight gain Side effects that usually do not require medical attention (report to your doctor or health care professional if they continue or are bothersome):  back pain  changes in taste  decreased appetite  dry skin  nausea  tiredness This list may not describe all possible side effects. Call your doctor for medical advice about side effects. You may report side effects to FDA at  1-800-FDA-1088. Where should I keep my medicine? This drug is given in a hospital or clinic and will not be stored at home. NOTE: This sheet is a summary. It may not cover all possible information. If you have questions about this medicine, talk to your doctor, pharmacist, or health care provider.  2021 Elsevier/Gold Standard (2019-08-10 10:50:46)

## 2020-11-06 ENCOUNTER — Telehealth: Payer: Self-pay | Admitting: *Deleted

## 2020-11-06 NOTE — Telephone Encounter (Signed)
-----   Message from Wylene Men, RN sent at 11/05/2020  3:40 PM EST ----- Regarding: [place] Patient received 1st time Avastin.  Tolerated well.  No s/s or c/o distress or discomfort.

## 2020-11-06 NOTE — Telephone Encounter (Signed)
Called & left message for pt to return call to let us know how she is doing post treatment.

## 2020-11-19 MED FILL — XARELTO 20 MG TABLET: 20 | 30 days supply | Qty: 30 | Fill #3

## 2020-11-20 ENCOUNTER — Other Ambulatory Visit: Payer: Self-pay

## 2020-11-20 ENCOUNTER — Inpatient Hospital Stay: Payer: 59

## 2020-11-20 ENCOUNTER — Inpatient Hospital Stay (HOSPITAL_BASED_OUTPATIENT_CLINIC_OR_DEPARTMENT_OTHER): Payer: 59 | Admitting: Internal Medicine

## 2020-11-20 VITALS — BP 140/107 | HR 62 | Temp 97.8°F | Resp 18 | Ht 66.0 in | Wt 175.7 lb

## 2020-11-20 VITALS — BP 129/96 | HR 84 | Temp 97.5°F

## 2020-11-20 DIAGNOSIS — Z5112 Encounter for antineoplastic immunotherapy: Secondary | ICD-10-CM | POA: Diagnosis not present

## 2020-11-20 DIAGNOSIS — Z79899 Other long term (current) drug therapy: Secondary | ICD-10-CM | POA: Diagnosis not present

## 2020-11-20 DIAGNOSIS — Z7952 Long term (current) use of systemic steroids: Secondary | ICD-10-CM | POA: Diagnosis not present

## 2020-11-20 DIAGNOSIS — C712 Malignant neoplasm of temporal lobe: Secondary | ICD-10-CM

## 2020-11-20 DIAGNOSIS — Z9049 Acquired absence of other specified parts of digestive tract: Secondary | ICD-10-CM | POA: Diagnosis not present

## 2020-11-20 DIAGNOSIS — Z923 Personal history of irradiation: Secondary | ICD-10-CM | POA: Diagnosis not present

## 2020-11-20 DIAGNOSIS — K7581 Nonalcoholic steatohepatitis (NASH): Secondary | ICD-10-CM | POA: Diagnosis not present

## 2020-11-20 DIAGNOSIS — R569 Unspecified convulsions: Secondary | ICD-10-CM

## 2020-11-20 DIAGNOSIS — G4733 Obstructive sleep apnea (adult) (pediatric): Secondary | ICD-10-CM | POA: Diagnosis not present

## 2020-11-20 DIAGNOSIS — Z7901 Long term (current) use of anticoagulants: Secondary | ICD-10-CM | POA: Diagnosis not present

## 2020-11-20 LAB — CBC WITH DIFFERENTIAL (CANCER CENTER ONLY)
Abs Immature Granulocytes: 0.2 10*3/uL — ABNORMAL HIGH (ref 0.00–0.07)
Basophils Absolute: 0 10*3/uL (ref 0.0–0.1)
Basophils Relative: 1 %
Eosinophils Absolute: 0 10*3/uL (ref 0.0–0.5)
Eosinophils Relative: 0 %
HCT: 43.3 % (ref 36.0–46.0)
Hemoglobin: 16.1 g/dL — ABNORMAL HIGH (ref 12.0–15.0)
Immature Granulocytes: 4 %
Lymphocytes Relative: 7 %
Lymphs Abs: 0.4 10*3/uL — ABNORMAL LOW (ref 0.7–4.0)
MCH: 35.5 pg — ABNORMAL HIGH (ref 26.0–34.0)
MCHC: 37.2 g/dL — ABNORMAL HIGH (ref 30.0–36.0)
MCV: 95.4 fL (ref 80.0–100.0)
Monocytes Absolute: 0.3 10*3/uL (ref 0.1–1.0)
Monocytes Relative: 6 %
Neutro Abs: 4.5 10*3/uL (ref 1.7–7.7)
Neutrophils Relative %: 82 %
Platelet Count: 75 10*3/uL — ABNORMAL LOW (ref 150–400)
RBC: 4.54 MIL/uL (ref 3.87–5.11)
RDW: 12.6 % (ref 11.5–15.5)
WBC Count: 5.5 10*3/uL (ref 4.0–10.5)
nRBC: 0 % (ref 0.0–0.2)

## 2020-11-20 LAB — CMP (CANCER CENTER ONLY)
ALT: 203 U/L — ABNORMAL HIGH (ref 0–44)
AST: 60 U/L — ABNORMAL HIGH (ref 15–41)
Albumin: 4 g/dL (ref 3.5–5.0)
Alkaline Phosphatase: 46 U/L (ref 38–126)
Anion gap: 8 (ref 5–15)
BUN: 12 mg/dL (ref 8–23)
CO2: 25 mmol/L (ref 22–32)
Calcium: 9.4 mg/dL (ref 8.9–10.3)
Chloride: 105 mmol/L (ref 98–111)
Creatinine: 0.72 mg/dL (ref 0.44–1.00)
GFR, Estimated: >60 mL/min (ref >60)
Glucose, Bld: 96 mg/dL (ref 70–99)
Potassium: 3.7 mmol/L (ref 3.5–5.1)
Sodium: 138 mmol/L (ref 135–145)
Total Bilirubin: 2.7 mg/dL — ABNORMAL HIGH (ref 0.3–1.2)
Total Protein: 6.8 g/dL (ref 6.5–8.1)

## 2020-11-20 MED ORDER — SODIUM CHLORIDE 0.9 % IV SOLN
10.0000 mg/kg | Freq: Once | INTRAVENOUS | Status: DC
  Filled 2020-11-20: qty 28

## 2020-11-20 MED ORDER — SODIUM CHLORIDE 0.9 % IV SOLN
10.0000 mg/kg | Freq: Once | INTRAVENOUS | Status: AC
  Administered 2020-11-20: 800 mg via INTRAVENOUS
  Filled 2020-11-20: qty 16

## 2020-11-20 MED ORDER — SODIUM CHLORIDE 0.9 % IV SOLN
Freq: Once | INTRAVENOUS | Status: AC
  Filled 2020-11-20: qty 250

## 2020-11-20 NOTE — Progress Notes (Signed)
[facility] [place], [place] [address] [phone]  Interval Evaluation  Date of Service: 11/20/20 Patient Name: Terry Bell Patient MRN: [id number] Patient DOB: 28-Jun-1956 Provider: Ventura Sellers, MD  Identifying Statement:  Terry Bell is a 65 y.o. female with right temporal glioblastoma   Referring Provider: Martinique, Betty G, MD [place],  [place] [address]  Oncologic History: Oncology History  Glioblastoma multiforme of temporal lobe ([place])  04/19/2020 Surgery   Craniotomy, right temporal resection with Dr. Christella Noa   05/28/2020 - 07/09/2020 Radiation Therapy   IMRT with concurrent Temodar 61m/m2   05/29/2020 -  Chemotherapy   The patient had temozolomide (TEMODAR) 140 MG capsule, 150 mg/m2/day = 280 mg, Oral, Daily, 0 of 1 cycle, Start date: 09/27/2020, End date: -- Dose modification: 150 mg/m2/day (original dose 150 mg/m2/day, Cycle 1)  for chemotherapy treatment.    11/05/2020 -  Chemotherapy    Patient is on Treatment Plan: BRAIN GLIOBLASTOMA CONSOLIDATION TEMOZOLOMIDE DAYS 1-5 Q28 DAYS    Patient is on Antibody Plan: BRAIN GBM BEVACIZUMAB 14D X 6 CYCLES      Biomarkers:  MGMT Methylated.  IDH 1/2 Wild type.  EGFR Not expressed  TERT "Mutated   Interval History:  Terry NEEDLESpresents today for second avastin infusion.  Left sided weakness has fluctuated, but overall improved or stabilized with higher dose of decadron; initially was up to 863mdaily, but now down to 30m12m She is still having difficulty ambulating with the walker, requiring help for more ADLs. No clear change in function after the avastin infusion two weeks ago. Fortunately the swelling in her left lower leg is considerably improved with the lasix, which she is using PRN at this time.  Dexamethasone 11/20/20: 30mg36m+P (04/26/20) Patient presented last week with several days of progressive new-onset frontal headache.  This may  be have been associated with change in taste, but no other neurologic complaints.  CNS imaging demonstrated a large right temporal mass.  She underwent resection on 04/19/20 with Dr. CabbChristella Noas discharged on 6/27.  She has no complaints today aside from some residual pain at the surgical site.  Does complain of "hole in vision" on the left side, but otherwise no neurologic deficits.  Taking decadron 30mg 830mce per day.  Medications: Current Outpatient Medications on File Prior to Visit  Medication Sig Dispense Refill  . acetaminophen (TYLENOL) 325 MG tablet Take 650 mg by mouth every 6 (six) hours as needed. Per pt, not to exceed 2G in a given day    . Cholecalciferol (VITAMIN D) 1000 UNITS capsule Take 1,000 Units by mouth 2 (two) times daily.      . dexMarland Kitchenmethasone (DECADRON) 4 MG tablet Take 1 tablet (4 mg total) by mouth 2 (two) times daily with a meal. 60 tablet 1  . furosemide (LASIX) 20 MG tablet Take 1 tablet (20 mg total) by mouth daily. 30 tablet 1  . levETIRAcetam (KEPPRA) 500 MG tablet Take 1 tablet (500 mg total) by mouth 2 (two) times daily. 60 tablet 3  . ondansetron (ZOFRAN) 8 MG tablet Take 1 tablet (8 mg total) by mouth 2 (two) times daily as needed (nausea and vomiting). May take 30-60 minutes prior to Temodar administration if nausea/vomiting occurs. 30 tablet 1  . pantoprazole (PROTONIX) 40 MG tablet Take 1 tablet (40 mg total) by mouth daily. 30 tablet 3  . polyethylene glycol (MIRALAX / GLYCOLAX)  17 g packet Take 17 g by mouth daily.    . Probiotic Product (ALIGN) 4 MG CAPS Take 4 mg by mouth daily.     . rivaroxaban (XARELTO) 20 MG TABS tablet Take 1 tablet (20 mg total) by mouth daily with supper. 30 tablet 3  . temozolomide (TEMODAR) 140 MG capsule Take 2 capsules (280 mg total) by mouth daily. May take on an empty stomach to decrease nausea & vomiting. 10 capsule 0   No current facility-administered medications on file prior to visit.    Allergies:  Allergies  Allergen  Reactions  . Niacin Hives and Rash   Past Medical History:  Past Medical History:  Diagnosis Date  . Arthritis   . Chronic nonalcoholic liver disease   . Complication of anesthesia   . Continuous leakage of urine   . Depression    situational  . Diverticulosis   . OSA (obstructive sleep apnea) 07/22/2016  . PONV (postoperative nausea and vomiting)   . Steatohepatitis   . Tinnitus   . Varicose veins    Past Surgical History:  Past Surgical History:  Procedure Laterality Date  . APPLICATION OF CRANIAL NAVIGATION N/A 04/19/2020   Procedure: APPLICATION OF CRANIAL NAVIGATION;  Surgeon: Ashok Pall, MD;  Location: [facility];  Service: Neurosurgery;  Laterality: N/A;  . arthroscopic left knee sugery    . bilateral bunionectomy    . BREAST BIOPSY Left   . CHOLECYSTECTOMY    . colonsocopy    . CRANIOTOMY Right 04/19/2020   Procedure: Right Temporal craniotomy for tumor resection;  Surgeon: Ashok Pall, MD;  Location: [facility];  Service: Neurosurgery;  Laterality: Right;  . debridement and removal and lateral release of torn cartlidge in rt knee    . gum grating    . rt knee arthroscopy    . torn cartledge in rt knee     Social History:  Social History   Socioeconomic History  . Marital status: Married    Spouse name: Not on file  . Number of children: Not on file  . Years of education: Not on file  . Highest education level: Not on file  Occupational History  . 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 Smoking status: Never Smoker  . Smokeless tobacco: Never Used  Substance and Sexual Activity  . Alcohol use: No  . Drug use: No  . Sexual activity: Not on file  Other Topics Concern  . 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 Diabetes Mother   . Liver disease Mother   . Diabetes Sister   . Liver disease Sister   . Hypertension Other   . Cancer Other   . Hemochromatosis Other   . Cancer Father        Throat  . Hypertension Brother   . Colon cancer Neg Hx   . Breast cancer Neg Hx     Review of Systems: Constitutional: Doesn't report fevers, chills or abnormal weight loss Eyes: Doesn't report blurriness of vision  Ears, nose, mouth, throat, and face: Doesn't report sore throat Respiratory: Doesn't report cough, dyspnea or wheezes Cardiovascular: Doesn't report palpitation, chest discomfort  Gastrointestinal:  Doesn't report nausea, constipation, diarrhea GU: Doesn't report incontinence Skin: Doesn't report skin rashes Neurological: Per HPI Musculoskeletal: Doesn't report joint pain Behavioral/Psych: Doesn't report anxiety  Physical Exam: Vitals:   11/20/20 0911  BP: (!) 140/107  Pulse: 62  Resp: 18  Temp: 97.8 F (36.6 C)  SpO2: 99%   KPS: 60. General: Alert, cooperative, pleasant, in no acute distress Head: Normal EENT: No conjunctival injection or scleral icterus.  Lungs: Resp effort normal Cardiac: Regular rate Abdomen: Non-distended abdomen Skin: No rashes cyanosis or petechiae. Extremities: No clubbing or edema  Neurologic Exam: Mental Status: Awake, alert, attentive to examiner. Oriented to self and environment. Language is fluent with intact comprehension.  Mild motor and sensory neglect.  Cranial Nerves: Visual acuity is grossly normal. Left hemianopia. Extra-ocular movements intact. No ptosis. Face is symmetric Motor: Tone and bulk are normal. Power is 4/5 in left arm and leg, with fine motor dystaxia. Reflexes are symmetric, no pathologic reflexes present.  Sensory: Extinguishes left leg and arm. Gait:  Deferred   Labs: I have reviewed the data as listed    Component Value Date/Time   NA 135 11/05/2020 1149   K 4.1 11/05/2020 1149   CL 108 11/05/2020 1149   CO2 20 (L) 11/05/2020 1149   GLUCOSE 152 (H) 11/05/2020 1149   BUN 16 11/05/2020 1149   CREATININE 0.71 11/05/2020 1149   CALCIUM 9.7 11/05/2020 1149   PROT 6.8 11/05/2020 1149   ALBUMIN 4.0 11/05/2020 1149   AST 66 (H) 11/05/2020 1149   ALT 208 (H) 11/05/2020 1149   ALKPHOS 43 11/05/2020 1149   BILITOT 1.9 (H) 11/05/2020 1149   GFRNONAA >60 11/05/2020 1149   GFRAA >60 07/30/2020 1202   Lab Results  Component Value Date   WBC 5.5 11/20/2020   NEUTROABS 4.5 11/20/2020   HGB 16.1 (H) 11/20/2020   HCT 43.3 11/20/2020   MCV 95.4 11/20/2020   PLT 75 (L) 11/20/2020   Assessment/Plan Glioblastoma multiforme of temporal lobe ([place]) [C71.2]   ESTERLENE ATIYEH is clinically stable today.  Labs again demonstrate elevated bilirubin, thrombocytopenia.  These abnormalities would likely be prohibitive of cytotoxic chemotherapy, but Avastin is safe to administer today.  She was unable to give urine.  We recommended continuing with single agent IV avastin 50m/kg q2 weeks, now on second infusion.  Avastin should be held for the following:  ANC less than 500  Platelets less than 50,000  LFT or creatinine greater than 2x ULN  If clinical concerns/contraindications develop  Decadron can be adjusted as needed for focal deficits as discussed, currently on 427mdaily.  Keppra should be continued at 50041mID, can increase if events recur again.  May con't PRN use of lasix for dependent edema.  We ask that DonMACEY WURTZturn to clinic in 2 weeks with labs for next avastin infusion.   All questions were answered. The patient knows to call the clinic with any problems, questions or concerns. No barriers to learning were detected.  I have spent a total of 30 minutes of face-to-face and non-face-to-face time, excluding clinical staff  time, preparing to see patient, ordering tests and/or medications, counseling the patient, and documenting clinical information in the electronic or other health record    ZacVentura SellersD Medical Director of Neuro-Oncology [facility] [place]/25/22  9:02 AM

## 2020-11-20 NOTE — Progress Notes (Signed)
Per Dr. Mickeal Skinner, ok to treat today with Plts 75, ALT 203, Total bili 2.7 and without resulted urine protein.

## 2020-11-20 NOTE — Progress Notes (Signed)
MD Vaslow would like to change dose of Mvasi based on the patient's increase in weight.

## 2020-11-20 NOTE — Patient Instructions (Signed)
[facility] Discharge Instructions for Patients Receiving Chemotherapy  Today you received the following immunotherapy agent: Bevacizumab  To help prevent nausea and vomiting after your treatment, we encourage you to take your nausea medication as directed by your MD.   If you develop nausea and vomiting that is not controlled by your nausea medication, call the clinic.   BELOW ARE SYMPTOMS THAT SHOULD BE REPORTED IMMEDIATELY:  *FEVER GREATER THAN 100.5 F  *CHILLS WITH OR WITHOUT FEVER  NAUSEA AND VOMITING THAT IS NOT CONTROLLED WITH YOUR NAUSEA MEDICATION  *UNUSUAL SHORTNESS OF BREATH  *UNUSUAL BRUISING OR BLEEDING  TENDERNESS IN MOUTH AND THROAT WITH OR WITHOUT PRESENCE OF ULCERS  *URINARY PROBLEMS  *BOWEL PROBLEMS  UNUSUAL RASH Items with * indicate a potential emergency and should be followed up as soon as possible.  Feel free to call the clinic should you have any questions or concerns. The clinic phone number is [phone].  Please show the CHEMO ALERT CARD at check-in to the Emergency Department and triage nurse.   

## 2020-12-01 DIAGNOSIS — C711 Malignant neoplasm of frontal lobe: Secondary | ICD-10-CM | POA: Diagnosis not present

## 2020-12-04 ENCOUNTER — Inpatient Hospital Stay: Payer: 59

## 2020-12-04 ENCOUNTER — Other Ambulatory Visit: Payer: Self-pay

## 2020-12-04 ENCOUNTER — Inpatient Hospital Stay (HOSPITAL_BASED_OUTPATIENT_CLINIC_OR_DEPARTMENT_OTHER): Payer: 59 | Admitting: Internal Medicine

## 2020-12-04 ENCOUNTER — Inpatient Hospital Stay: Payer: 59 | Attending: Internal Medicine

## 2020-12-04 ENCOUNTER — Ambulatory Visit: Payer: 59

## 2020-12-04 VITALS — BP 145/90 | HR 50 | Temp 95.5°F | Resp 19 | Ht 66.0 in

## 2020-12-04 VITALS — BP 141/85 | HR 62 | Temp 97.7°F | Resp 20

## 2020-12-04 DIAGNOSIS — F32A Depression, unspecified: Secondary | ICD-10-CM | POA: Insufficient documentation

## 2020-12-04 DIAGNOSIS — Z7952 Long term (current) use of systemic steroids: Secondary | ICD-10-CM | POA: Insufficient documentation

## 2020-12-04 DIAGNOSIS — D696 Thrombocytopenia, unspecified: Secondary | ICD-10-CM | POA: Insufficient documentation

## 2020-12-04 DIAGNOSIS — C712 Malignant neoplasm of temporal lobe: Secondary | ICD-10-CM

## 2020-12-04 DIAGNOSIS — Z5112 Encounter for antineoplastic immunotherapy: Secondary | ICD-10-CM | POA: Insufficient documentation

## 2020-12-04 DIAGNOSIS — K7581 Nonalcoholic steatohepatitis (NASH): Secondary | ICD-10-CM | POA: Diagnosis not present

## 2020-12-04 DIAGNOSIS — Z79899 Other long term (current) drug therapy: Secondary | ICD-10-CM | POA: Insufficient documentation

## 2020-12-04 DIAGNOSIS — Z923 Personal history of irradiation: Secondary | ICD-10-CM | POA: Insufficient documentation

## 2020-12-04 DIAGNOSIS — Z801 Family history of malignant neoplasm of trachea, bronchus and lung: Secondary | ICD-10-CM | POA: Diagnosis not present

## 2020-12-04 DIAGNOSIS — Z833 Family history of diabetes mellitus: Secondary | ICD-10-CM | POA: Diagnosis not present

## 2020-12-04 DIAGNOSIS — R569 Unspecified convulsions: Secondary | ICD-10-CM

## 2020-12-04 DIAGNOSIS — G4733 Obstructive sleep apnea (adult) (pediatric): Secondary | ICD-10-CM | POA: Insufficient documentation

## 2020-12-04 DIAGNOSIS — C719 Malignant neoplasm of brain, unspecified: Secondary | ICD-10-CM

## 2020-12-04 DIAGNOSIS — Z8249 Family history of ischemic heart disease and other diseases of the circulatory system: Secondary | ICD-10-CM | POA: Diagnosis not present

## 2020-12-04 LAB — CBC WITH DIFFERENTIAL (CANCER CENTER ONLY)
Abs Immature Granulocytes: 0.23 10*3/uL — ABNORMAL HIGH (ref 0.00–0.07)
Basophils Absolute: 0 10*3/uL (ref 0.0–0.1)
Basophils Relative: 0 %
Eosinophils Absolute: 0 10*3/uL (ref 0.0–0.5)
Eosinophils Relative: 0 %
HCT: 42.6 % (ref 36.0–46.0)
Hemoglobin: 15.4 g/dL — ABNORMAL HIGH (ref 12.0–15.0)
Immature Granulocytes: 3 %
Lymphocytes Relative: 6 %
Lymphs Abs: 0.4 10*3/uL — ABNORMAL LOW (ref 0.7–4.0)
MCH: 35.1 pg — ABNORMAL HIGH (ref 26.0–34.0)
MCHC: 36.2 g/dL — ABNORMAL HIGH (ref 30.0–36.0)
MCV: 97 fL (ref 80.0–100.0)
Monocytes Absolute: 0.4 10*3/uL (ref 0.1–1.0)
Monocytes Relative: 5 %
Neutro Abs: 6.1 10*3/uL (ref 1.7–7.7)
Neutrophils Relative %: 86 %
Platelet Count: 79 10*3/uL — ABNORMAL LOW (ref 150–400)
RBC: 4.39 MIL/uL (ref 3.87–5.11)
RDW: 13.1 % (ref 11.5–15.5)
WBC Count: 7.2 10*3/uL (ref 4.0–10.5)
nRBC: 0 % (ref 0.0–0.2)

## 2020-12-04 LAB — CMP (CANCER CENTER ONLY)
ALT: 302 U/L (ref 0–44)
AST: 79 U/L — ABNORMAL HIGH (ref 15–41)
Albumin: 3.9 g/dL (ref 3.5–5.0)
Alkaline Phosphatase: 46 U/L (ref 38–126)
Anion gap: 11 (ref 5–15)
BUN: 16 mg/dL (ref 8–23)
CO2: 19 mmol/L — ABNORMAL LOW (ref 22–32)
Calcium: 9.4 mg/dL (ref 8.9–10.3)
Chloride: 107 mmol/L (ref 98–111)
Creatinine: 0.72 mg/dL (ref 0.44–1.00)
GFR, Estimated: >60 mL/min (ref >60)
Glucose, Bld: 98 mg/dL (ref 70–99)
Potassium: 4 mmol/L (ref 3.5–5.1)
Sodium: 137 mmol/L (ref 135–145)
Total Bilirubin: 2.1 mg/dL — ABNORMAL HIGH (ref 0.3–1.2)
Total Protein: 6.6 g/dL (ref 6.5–8.1)

## 2020-12-04 LAB — TOTAL PROTEIN, URINE DIPSTICK: Protein, ur: NEGATIVE mg/dL

## 2020-12-04 MED ORDER — SODIUM CHLORIDE 0.9 % IV SOLN
10.0000 mg/kg | Freq: Once | INTRAVENOUS | Status: AC
  Administered 2020-12-04: 800 mg via INTRAVENOUS
  Filled 2020-12-04: qty 32

## 2020-12-04 MED ORDER — SODIUM CHLORIDE 0.9 % IV SOLN
Freq: Once | INTRAVENOUS | Status: AC
  Filled 2020-12-04: qty 250

## 2020-12-04 NOTE — Progress Notes (Signed)
Okay to treat with Platelets of 79 and elevated LFTs per Dr. Mickeal Skinner.

## 2020-12-04 NOTE — Progress Notes (Signed)
[facility] at [place], [place] [address] [phone]  Interval Evaluation  Date of Service: 12/04/20 Patient Name: Terry Bell Patient MRN: [id number] Patient DOB: [birth year] Provider: Ventura Sellers, MD  Identifying Statement:  Terry Bell is a 65 y.o. female with right temporal glioblastoma   Referring Provider: Martinique, Betty G, MD [place],  [place] [address]  Oncologic History: Oncology History  Glioblastoma multiforme of temporal lobe [facility])  04/19/2020 Surgery   Craniotomy, right temporal resection with Dr. Christella Noa   05/28/2020 - 07/09/2020 Radiation Therapy   IMRT with concurrent Temodar 65m/m2   05/29/2020 -  Chemotherapy    Patient is on Treatment Plan: BRAIN GLIOBLASTOMA CONSOLIDATION TEMOZOLOMIDE DAYS 1-5 Q28 DAYS    Patient is on Antibody Plan: BRAIN GBM BEVACIZUMAB 14D X 6 CYCLES    11/05/2020 -  Chemotherapy    Patient is on Treatment Plan: BRAIN GLIOBLASTOMA CONSOLIDATION TEMOZOLOMIDE DAYS 1-5 Q28 DAYS    Patient is on Antibody Plan: BRAIN GBM BEVACIZUMAB 14D X 6 CYCLES      Biomarkers:  MGMT Methylated.  IDH 1/2 Wild type.  EGFR Not expressed  TERT "Mutated   Interval History:  Terry NASSpresents today for avastin infusion.  Left sided weakness remains stable, decadron is currently at 431mdaily.  Continues to have difficulty ambulating with the walker, requiring help for more ADLs. Has not needed any lasix in recent days.  Overall no issues or concerns with avastin infusion, tolerability.  Dexamethasone 11/20/20: 4m37mH+P (04/26/20) Patient presented last week with several days of progressive new-onset frontal headache.  This may be have been associated with change in taste, but no other neurologic complaints.  CNS imaging demonstrated a large right temporal mass.  She underwent resection on 04/19/20 with Dr. CabChristella Noaas discharged on 6/27.  She has no complaints today aside from  some residual pain at the surgical site.  Does complain of "hole in vision" on the left side, but otherwise no neurologic deficits.  Taking decadron 4mg82mice per day.  Medications: Current Outpatient Medications on File Prior to Visit  Medication Sig Dispense Refill  . acetaminophen (TYLENOL) 325 MG tablet Take 650 mg by mouth every 6 (six) hours as needed. Per pt, not to exceed 2G in a given day    . Cholecalciferol (VITAMIN D) 1000 UNITS capsule Take 1,000 Units by mouth 2 (two) times daily.      . deMarland Kitchenamethasone (DECADRON) 4 MG tablet Take 1 tablet (4 mg total) by mouth 2 (two) times daily with a meal. 60 tablet 1  . furosemide (LASIX) 20 MG tablet Take 1 tablet (20 mg total) by mouth daily. 30 tablet 1  . levETIRAcetam (KEPPRA) 500 MG tablet Take 1 tablet (500 mg total) by mouth 2 (two) times daily. 60 tablet 3  . ondansetron (ZOFRAN) 8 MG tablet Take 1 tablet (8 mg total) by mouth 2 (two) times daily as needed (nausea and vomiting). May take 30-60 minutes prior to Temodar administration if nausea/vomiting occurs. 30 tablet 1  . pantoprazole (PROTONIX) 40 MG tablet Take 1 tablet (40 mg total) by mouth daily. 30 tablet 3  . polyethylene glycol (MIRALAX / GLYCOLAX) 17 g packet Take 17 g by mouth daily.    . Probiotic Product (ALIGN) 4 MG CAPS Take 4 mg by mouth daily.     . rivaroxaban (XARELTO) 20 MG TABS tablet Take 1 tablet (20 mg total) by  mouth daily with supper. 30 tablet 3  . temozolomide (TEMODAR) 140 MG capsule Take 2 capsules (280 mg total) by mouth daily. May take on an empty stomach to decrease nausea & vomiting. 10 capsule 0   No current facility-administered medications on file prior to visit.    Allergies:  Allergies  Allergen Reactions  . Niacin Hives and Rash   Past Medical History:  Past Medical History:  Diagnosis Date  . Arthritis   . Chronic nonalcoholic liver disease   . Complication of anesthesia   . Continuous leakage of urine   . Depression    situational   . Diverticulosis   . OSA (obstructive sleep apnea) 07/22/2016  . PONV (postoperative nausea and vomiting)   . Steatohepatitis   . Tinnitus   . Varicose veins    Past Surgical History:  Past Surgical History:  Procedure Laterality Date  . APPLICATION OF CRANIAL NAVIGATION N/A 04/19/2020   Procedure: APPLICATION OF CRANIAL NAVIGATION;  Surgeon: Ashok Pall, MD;  Location: [facility];  Service: Neurosurgery;  Laterality: N/A;  . arthroscopic left knee sugery    . bilateral bunionectomy    . BREAST BIOPSY Left   . CHOLECYSTECTOMY    . colonsocopy    . CRANIOTOMY Right 04/19/2020   Procedure: Right Temporal craniotomy for tumor resection;  Surgeon: Ashok Pall, MD;  Location: [facility];  Service: Neurosurgery;  Laterality: Right;  . debridement and removal and lateral release of torn cartlidge in rt knee    . gum grating    . rt knee arthroscopy    . torn cartledge in rt knee     Social History:  Social History   Socioeconomic History  . Marital status: Married    Spouse name: Not on file  . Number of children: Not on file  . Years of education: Not on file  . Highest education level: Not on file  Occupational History  . 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 Smoking status: Never Smoker  . Smokeless tobacco: Never Used  Substance and Sexual Activity  . Alcohol use: No  . Drug use: No  . Sexual activity: Not on file  Other Topics Concern  . 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 Diabetes Mother   . Liver disease Mother   . Diabetes Sister   . Liver  disease Sister   . Hypertension Other   . Cancer Other   . Hemochromatosis Other   . Cancer Father        Throat  . Hypertension Brother   . Colon cancer Neg Hx   . Breast cancer Neg Hx     Review of Systems: Constitutional: Doesn't report fevers, chills or abnormal weight loss Eyes: Doesn't report blurriness of vision Ears, nose, mouth, throat, and face: Doesn't report sore throat Respiratory: Doesn't report cough, dyspnea or wheezes Cardiovascular: Doesn't report palpitation, chest discomfort  Gastrointestinal:  Doesn't report nausea, constipation, diarrhea GU: Doesn't report incontinence Skin: Doesn't report skin rashes Neurological: Per HPI  Musculoskeletal: Doesn't report joint pain Behavioral/Psych: Doesn't report anxiety  Physical Exam: Vitals:   12/04/20 0858  BP: (!) 145/90  Pulse: (!) 50  Resp: 19  Temp: (!) 95.5 F (35.3 C)  SpO2: 98%   KPS: 60. General: Alert, cooperative, pleasant, in no acute distress Head: Normal EENT: No conjunctival injection or scleral icterus.  Lungs: Resp effort normal Cardiac: Regular rate Abdomen: Non-distended abdomen Skin: No rashes cyanosis or petechiae. Extremities: No clubbing or edema  Neurologic Exam: Mental Status: Awake, alert, attentive to examiner. Oriented to self and environment. Language is fluent with intact comprehension.  Mild motor and sensory neglect.  Cranial Nerves: Visual acuity is grossly normal. Left hemianopia. Extra-ocular movements intact. No ptosis. Face is symmetric Motor: Tone and bulk are normal. Power is 4/5 in left arm and leg, with fine motor dystaxia. Reflexes are symmetric, no pathologic reflexes present.  Sensory: Extinguishes left leg and arm. Gait: Deferred   Labs: I have reviewed the data as listed    Component Value Date/Time   NA 138 11/20/2020 0832   K 3.7 11/20/2020 0832   CL 105 11/20/2020 0832   CO2 25 11/20/2020 0832   GLUCOSE 96 11/20/2020 0832   BUN 12 11/20/2020 0832    CREATININE 0.72 11/20/2020 0832   CALCIUM 9.4 11/20/2020 0832   PROT 6.8 11/20/2020 0832   ALBUMIN 4.0 11/20/2020 0832   AST 60 (H) 11/20/2020 0832   ALT 203 (H) 11/20/2020 0832   ALKPHOS 46 11/20/2020 0832   BILITOT 2.7 (H) 11/20/2020 0832   GFRNONAA >60 11/20/2020 0832   GFRAA >60 07/30/2020 1202   Lab Results  Component Value Date   WBC 7.2 12/04/2020   NEUTROABS 6.1 12/04/2020   HGB 15.4 (H) 12/04/2020   HCT 42.6 12/04/2020   MCV 97.0 12/04/2020   PLT 79 (L) 12/04/2020   Assessment/Plan Glioblastoma multiforme of temporal lobe ([place]) [C71.2]   TAMMYE KAHLER is clinically stable today.  Labs again demonstrate elevated bilirubin, thrombocytopenia consistent known history of non-alcoholic steatohepatitis.  Given high buren of inflammation and progressive tumor, good response thus far, benefit of dosing Avastin outweighs risk of further hepatoxicity.  We recommended continuing with single agent IV avastin 91m/kg q2 weeks without cytotoxic chemotherapy.  Avastin should be held for the following:  ANC less than 500  Platelets less than 50,000  LFT or creatinine greater than 2x ULN  If clinical concerns/contraindications develop  Decadron can be adjusted as needed for focal deficits as discussed, currently on 420mdaily.  Keppra should be continued at 50063mID, can increase if events recur again.  May con't PRN use of lasix for dependent edema.  We ask that DonKARISSA MEENANturn to clinic in 2 weeks with labs for next avastin infusion.   All questions were answered. The patient knows to call the clinic with any problems, questions or concerns. No barriers to learning were detected.  I have spent a total of 30 minutes of face-to-face and non-face-to-face time, excluding clinical staff time, preparing to see patient, ordering tests and/or medications, counseling the patient, and documenting clinical information in the electronic or other health record    ZacVentura SellersMD Medical Director of Neuro-Oncology [facility] [place]/08/22 9:06 AM

## 2020-12-04 NOTE — Patient Instructions (Signed)
[facility] Discharge Instructions for Patients Receiving Chemotherapy  Today you received the following immunotherapy agent: Bevacizumab  To help prevent nausea and vomiting after your treatment, we encourage you to take your nausea medication As directed by your MD.   If you develop nausea and vomiting that is not controlled by your nausea medication, call the clinic.   BELOW ARE SYMPTOMS THAT SHOULD BE REPORTED IMMEDIATELY:  *FEVER GREATER THAN 100.5 F  *CHILLS WITH OR WITHOUT FEVER  NAUSEA AND VOMITING THAT IS NOT CONTROLLED WITH YOUR NAUSEA MEDICATION  *UNUSUAL SHORTNESS OF BREATH  *UNUSUAL BRUISING OR BLEEDING  TENDERNESS IN MOUTH AND THROAT WITH OR WITHOUT PRESENCE OF ULCERS  *URINARY PROBLEMS  *BOWEL PROBLEMS  UNUSUAL RASH Items with * indicate a potential emergency and should be followed up as soon as possible.  Feel free to call the clinic should you have any questions or concerns. The clinic phone number is [phone].  Please show the [place] at check-in to the Emergency Department and triage nurse.

## 2020-12-07 ENCOUNTER — Telehealth: Payer: Self-pay | Admitting: Internal Medicine

## 2020-12-07 NOTE — Telephone Encounter (Signed)
Scheduled per 02/08 los, patient has been called and notified.

## 2020-12-10 MED FILL — DEXAMETHASONE 4 MG TABLET: 4 | 30 days supply | Qty: 60 | Fill #1

## 2020-12-18 ENCOUNTER — Encounter: Payer: Self-pay | Admitting: Internal Medicine

## 2020-12-18 ENCOUNTER — Telehealth: Payer: Self-pay

## 2020-12-18 ENCOUNTER — Inpatient Hospital Stay: Payer: 59

## 2020-12-18 ENCOUNTER — Other Ambulatory Visit: Payer: Self-pay

## 2020-12-18 ENCOUNTER — Inpatient Hospital Stay (HOSPITAL_BASED_OUTPATIENT_CLINIC_OR_DEPARTMENT_OTHER): Payer: 59 | Admitting: Internal Medicine

## 2020-12-18 VITALS — BP 146/97 | Temp 98.1°F | Resp 14 | Ht 66.0 in | Wt 175.0 lb

## 2020-12-18 DIAGNOSIS — D696 Thrombocytopenia, unspecified: Secondary | ICD-10-CM | POA: Diagnosis not present

## 2020-12-18 DIAGNOSIS — Z5112 Encounter for antineoplastic immunotherapy: Secondary | ICD-10-CM | POA: Diagnosis not present

## 2020-12-18 DIAGNOSIS — G4733 Obstructive sleep apnea (adult) (pediatric): Secondary | ICD-10-CM | POA: Diagnosis not present

## 2020-12-18 DIAGNOSIS — R569 Unspecified convulsions: Secondary | ICD-10-CM | POA: Diagnosis not present

## 2020-12-18 DIAGNOSIS — Z7952 Long term (current) use of systemic steroids: Secondary | ICD-10-CM | POA: Diagnosis not present

## 2020-12-18 DIAGNOSIS — R3589 Other polyuria: Secondary | ICD-10-CM

## 2020-12-18 DIAGNOSIS — Z923 Personal history of irradiation: Secondary | ICD-10-CM | POA: Diagnosis not present

## 2020-12-18 DIAGNOSIS — C712 Malignant neoplasm of temporal lobe: Secondary | ICD-10-CM | POA: Diagnosis not present

## 2020-12-18 DIAGNOSIS — Z79899 Other long term (current) drug therapy: Secondary | ICD-10-CM | POA: Diagnosis not present

## 2020-12-18 DIAGNOSIS — F32A Depression, unspecified: Secondary | ICD-10-CM | POA: Diagnosis not present

## 2020-12-18 DIAGNOSIS — R350 Frequency of micturition: Secondary | ICD-10-CM

## 2020-12-18 DIAGNOSIS — K7581 Nonalcoholic steatohepatitis (NASH): Secondary | ICD-10-CM | POA: Diagnosis not present

## 2020-12-18 LAB — CMP (CANCER CENTER ONLY)
ALT: 292 U/L (ref 0–44)
AST: 72 U/L — ABNORMAL HIGH (ref 15–41)
Albumin: 3.7 g/dL (ref 3.5–5.0)
Alkaline Phosphatase: 48 U/L (ref 38–126)
Anion gap: 9 (ref 5–15)
BUN: 16 mg/dL (ref 8–23)
CO2: 20 mmol/L — ABNORMAL LOW (ref 22–32)
Calcium: 9.1 mg/dL (ref 8.9–10.3)
Chloride: 108 mmol/L (ref 98–111)
Creatinine: 0.68 mg/dL (ref 0.44–1.00)
GFR, Estimated: >60 mL/min (ref >60)
Glucose, Bld: 103 mg/dL — ABNORMAL HIGH (ref 70–99)
Potassium: 3.9 mmol/L (ref 3.5–5.1)
Sodium: 137 mmol/L (ref 135–145)
Total Bilirubin: 1.9 mg/dL — ABNORMAL HIGH (ref 0.3–1.2)
Total Protein: 6.5 g/dL (ref 6.5–8.1)

## 2020-12-18 LAB — CBC WITH DIFFERENTIAL (CANCER CENTER ONLY)
Abs Immature Granulocytes: 0.21 10*3/uL — ABNORMAL HIGH (ref 0.00–0.07)
Basophils Absolute: 0 10*3/uL (ref 0.0–0.1)
Basophils Relative: 1 %
Eosinophils Absolute: 0 10*3/uL (ref 0.0–0.5)
Eosinophils Relative: 0 %
HCT: 41.1 % (ref 36.0–46.0)
Hemoglobin: 14.8 g/dL (ref 12.0–15.0)
Immature Granulocytes: 4 %
Lymphocytes Relative: 6 %
Lymphs Abs: 0.3 10*3/uL — ABNORMAL LOW (ref 0.7–4.0)
MCH: 35.3 pg — ABNORMAL HIGH (ref 26.0–34.0)
MCHC: 36 g/dL (ref 30.0–36.0)
MCV: 98.1 fL (ref 80.0–100.0)
Monocytes Absolute: 0.3 10*3/uL (ref 0.1–1.0)
Monocytes Relative: 6 %
Neutro Abs: 4.7 10*3/uL (ref 1.7–7.7)
Neutrophils Relative %: 83 %
Platelet Count: 87 10*3/uL — ABNORMAL LOW (ref 150–400)
RBC: 4.19 MIL/uL (ref 3.87–5.11)
RDW: 13.5 % (ref 11.5–15.5)
WBC Count: 5.6 10*3/uL (ref 4.0–10.5)
nRBC: 0 % (ref 0.0–0.2)

## 2020-12-18 LAB — URINALYSIS, COMPLETE (UACMP) WITH MICROSCOPIC
Bilirubin Urine: NEGATIVE
Glucose, UA: NEGATIVE mg/dL
Hgb urine dipstick: NEGATIVE
Ketones, ur: NEGATIVE mg/dL
Leukocytes,Ua: NEGATIVE
Nitrite: NEGATIVE
Protein, ur: NEGATIVE mg/dL
Specific Gravity, Urine: 1.013 (ref 1.005–1.030)
pH: 7 (ref 5.0–8.0)

## 2020-12-18 LAB — TOTAL PROTEIN, URINE DIPSTICK: Protein, ur: NEGATIVE mg/dL

## 2020-12-18 MED ORDER — RIVAROXABAN 20 MG PO TABS: 20.0000 mg | ORAL_TABLET | Freq: Every day | ORAL | 3 refills | Status: DC

## 2020-12-18 MED ORDER — SODIUM CHLORIDE 0.9 % IV SOLN
10.0000 mg/kg | Freq: Once | INTRAVENOUS | Status: AC
Start: 2020-12-18 — End: 2020-12-18
  Administered 2020-12-18: 800 mg via INTRAVENOUS
  Filled 2020-12-18: qty 32

## 2020-12-18 MED ORDER — LEVETIRACETAM 500 MG PO TABS: 500.0000 mg | ORAL_TABLET | Freq: Two times a day (BID) | ORAL | 3 refills | Status: DC

## 2020-12-18 MED ORDER — SODIUM CHLORIDE 0.9 % IV SOLN
Freq: Once | INTRAVENOUS | Status: AC
  Filled 2020-12-18: qty 250

## 2020-12-18 MED FILL — XARELTO 20 MG TABLET: 20 | 30 days supply | Qty: 30 | Fill #0

## 2020-12-18 MED FILL — levETIRAcetam 500 MG TABS: 500 | 30 days supply | Qty: 60 | Fill #0

## 2020-12-18 NOTE — Progress Notes (Signed)
[facility] [address] [place], [place] [address] [phone]  Interval Evaluation  Date of Service: 12/18/20 Patient Name: Terry Bell Patient MRN: [id number] Patient DOB: 1956-04-19 Provider: Ventura Sellers, MD  Identifying Statement:  Terry Bell is a 65 y.o. female with right temporal glioblastoma   Referring Provider: Martinique, Betty G, MD [place],  [place] [address]  Oncologic History: Oncology History  Glioblastoma multiforme of temporal lobe [facility])  04/19/2020 Surgery   Craniotomy, right temporal resection with Dr. Christella Noa   05/28/2020 - 07/09/2020 Radiation Therapy   IMRT with concurrent Temodar 18m/m2   05/29/2020 -  Chemotherapy    Patient is on Treatment Plan: BRAIN GLIOBLASTOMA CONSOLIDATION TEMOZOLOMIDE DAYS 1-5 Q28 DAYS    Patient is on Antibody Plan: BRAIN GBM BEVACIZUMAB 14D X 6 CYCLES    11/05/2020 -  Chemotherapy    Patient is on Treatment Plan: BRAIN GLIOBLASTOMA CONSOLIDATION TEMOZOLOMIDE DAYS 1-5 Q28 DAYS    Patient is on Antibody Plan: BRAIN GBM BEVACIZUMAB 14D X 6 CYCLES      Biomarkers:  MGMT Methylated.  IDH 1/2 Wild type.  EGFR Not expressed  TERT "Mutated   Interval History:  Terry Bell today for avastin infusion. She describes some worsening of left sided weakness, particularly with her arm.  Decadron has been increased to 885mdaily, which has been somewhat helpful.  Still doing some ambulating with the walker, requiring help for more ADLs.  Leg swelling has actually decreased.  Overall no issues or concerns with avastin infusion, tolerability.  Dexamethasone 11/20/20: 43m25mH+P (04/26/20) Patient presented last week with several days of progressive new-onset frontal headache.  This may be have been associated with change in taste, but no other neurologic complaints.  CNS imaging demonstrated a large right temporal mass.  She underwent resection on 04/19/20 with Dr.  CabChristella Noaas discharged on 6/27.  She has no complaints today aside from some residual pain at the surgical site.  Does complain of "hole in vision" on the left side, but otherwise no neurologic deficits.  Taking decadron 43mg443mice per day.  Medications: Current Outpatient Medications on File Prior to Visit  Medication Sig Dispense Refill  . acetaminophen (TYLENOL) 325 MG tablet Take 650 mg by mouth every 6 (six) hours as needed. Per pt, not to exceed 2G in a given day    . Cholecalciferol (VITAMIN D) 1000 UNITS capsule Take 1,000 Units by mouth 2 (two) times daily.      . deMarland Kitchenamethasone (DECADRON) 4 MG tablet Take 1 tablet (4 mg total) by mouth 2 (two) times daily with a meal. 60 tablet 1  . ondansetron (ZOFRAN) 8 MG tablet Take 1 tablet (8 mg total) by mouth 2 (two) times daily as needed (nausea and vomiting). May take 30-60 minutes prior to Temodar administration if nausea/vomiting occurs. 30 tablet 1  . pantoprazole (PROTONIX) 40 MG tablet Take 1 tablet (40 mg total) by mouth daily. 30 tablet 3  . polyethylene glycol (MIRALAX / GLYCOLAX) 17 g packet Take 17 g by mouth daily.    . Probiotic Product (ALIGN) 4 MG CAPS Take 4 mg by mouth daily.     . furosemide (LASIX) 20 MG tablet Take 1 tablet (20 mg total) by mouth daily. (Patient not taking: Reported on 12/18/2020) 30 tablet 1  . temozolomide (TEMODAR) 140 MG capsule Take 2 capsules (280 mg total) by mouth daily. May take on an empty  stomach to decrease nausea & vomiting. (Patient not taking: Reported on 12/18/2020) 10 capsule 0   No current facility-administered medications on file prior to visit.    Allergies:  Allergies  Allergen Reactions  . Niacin Hives and Rash   Past Medical History:  Past Medical History:  Diagnosis Date  . Arthritis   . Chronic nonalcoholic liver disease   . Complication of anesthesia   . Continuous leakage of urine   . Depression    situational  . Diverticulosis   . OSA (obstructive sleep apnea) 07/22/2016   . PONV (postoperative nausea and vomiting)   . Steatohepatitis   . Tinnitus   . Varicose veins    Past Surgical History:  Past Surgical History:  Procedure Laterality Date  . APPLICATION OF CRANIAL NAVIGATION N/A 04/19/2020   Procedure: APPLICATION OF CRANIAL NAVIGATION;  Surgeon: Ashok Pall, MD;  Location: [facility];  Service: Neurosurgery;  Laterality: N/A;  . arthroscopic left knee sugery    . bilateral bunionectomy    . BREAST BIOPSY Left   . CHOLECYSTECTOMY    . colonsocopy    . CRANIOTOMY Right 04/19/2020   Procedure: Right Temporal craniotomy for tumor resection;  Surgeon: Ashok Pall, MD;  Location: [facility];  Service: Neurosurgery;  Laterality: Right;  . debridement and removal and lateral release of torn cartlidge in rt knee    . gum grating    . rt knee arthroscopy    . torn cartledge in rt knee     Social History:  Social History   Socioeconomic History  . Marital status: Married    Spouse name: Not on file  . Number of children: Not on file  . Years of education: Not on file  . Highest education level: Not on file  Occupational History  . 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 Smoking status: Never Smoker  . Smokeless tobacco: Never Used  Substance and Sexual Activity  . Alcohol use: No  . Drug use: No  . Sexual activity: Not on file  Other Topics Concern  . 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 Diabetes Mother   . Liver disease Mother   . Diabetes Sister   . Liver disease Sister   . Hypertension Other   . Cancer Other   .  Hemochromatosis Other   . Cancer Father        Throat  . Hypertension Brother   . Colon cancer Neg Hx   . Breast cancer Neg Hx     Review of Systems: Constitutional: Doesn't report fevers, chills or abnormal weight loss Eyes: Doesn't report blurriness of vision Ears, nose, mouth, throat, and face: Doesn't report sore throat Respiratory: Doesn't report cough, dyspnea or wheezes Cardiovascular: Doesn't report palpitation, chest discomfort  Gastrointestinal:  Doesn't report nausea, constipation, diarrhea GU: Doesn't report incontinence Skin: Doesn't report skin rashes Neurological: Per HPI Musculoskeletal: Doesn't report joint pain Behavioral/Psych: Doesn't report anxiety  Physical Exam: Vitals:   12/18/20 0800  BP: (!) 146/97  Resp: 14  Temp: 98.1 F (36.7 C)  SpO2: 98%   KPS: 60. General: Alert, cooperative, pleasant, in no acute distress Head: Normal EENT: No conjunctival injection or scleral icterus.  Lungs: Resp effort normal Cardiac: Regular rate Abdomen: Non-distended abdomen Skin: No rashes cyanosis or petechiae. Extremities: No clubbing or edema  Neurologic Exam: Mental Status: Awake, alert, attentive to examiner. Oriented to self and environment. Language is fluent with intact comprehension.  Mild motor and sensory neglect.  Cranial Nerves: Visual acuity is grossly normal. Left hemianopia. Extra-ocular movements intact. No ptosis. Face is symmetric Motor: Tone and bulk are normal. Power is 4/5 in left arm and leg, with fine motor dystaxia. Reflexes are symmetric, no pathologic reflexes present.  Sensory: Extinguishes left leg and arm. Gait: Deferred   Labs: I have reviewed the data as listed    Component Value Date/Time   NA 137 12/18/2020 0825   K 3.9 12/18/2020 0825   CL 108 12/18/2020 0825   CO2 20 (L) 12/18/2020 0825   GLUCOSE 103 (H) 12/18/2020 0825   BUN 16 12/18/2020 0825   CREATININE 0.68 12/18/2020 0825   CALCIUM 9.1 12/18/2020 0825   PROT  6.5 12/18/2020 0825   ALBUMIN 3.7 12/18/2020 0825   AST 72 (H) 12/18/2020 0825   ALT 292 (HH) 12/18/2020 0825   ALKPHOS 48 12/18/2020 0825   BILITOT 1.9 (H) 12/18/2020 0825   GFRNONAA >60 12/18/2020 0825   GFRAA >60 07/30/2020 1202   Lab Results  Component Value Date   WBC 5.6 12/18/2020   NEUTROABS 4.7 12/18/2020   HGB 14.8 12/18/2020   HCT 41.1 12/18/2020   MCV 98.1 12/18/2020   PLT 87 (L) 12/18/2020   Assessment/Plan Glioblastoma multiforme of temporal lobe ([facility]) [C71.2]   Terry Bell presents today with modest subjective progression of left sided motor deficits, requiring increased dose of dexamethasone.  Labs demonstrate modest improvement in liver enzymes, platelet count.  Given high buren of inflammation and progressive tumor, good response thus far, benefit of dosing Avastin outweighs risk of further hepatoxicity.  We recommended continuing with single agent IV avastin 100m/kg q2 weeks without cytotoxic chemotherapy.  Avastin should be held for the following:  ANC less than 500  Platelets less than 50,000  LFT or creatinine greater than 2x ULN  If clinical concerns/contraindications develop  Decadron can stay at 866mdaily, this dose has been well tolerated and therapeutic.  Keppra should be continued at 50082mID, can increase if events recur again.  May con't PRN use of lasix for dependent edema.  We ask that Terry BANNISTERturn to clinic in 2 weeks with MRI brain for evaluation, as scheduled.   All questions were answered. The patient knows to call the clinic with any problems, questions or concerns. No barriers to learning were detected.  I have spent a total of 30 minutes of face-to-face and non-face-to-face time, excluding clinical staff time, preparing to see patient, ordering tests and/or medications, counseling the patient, and documenting clinical information in the electronic or other health record    ZacVentura SellersD Medical Director of  Neuro-Oncology [facility] [place]/22/22 2:54 PM

## 2020-12-18 NOTE — Telephone Encounter (Signed)
CRITICAL VALUE STICKER  CRITICAL VALUE: Alt 292  RECEIVER (on-site recipient of call): Evette Georges, [place] NOTIFIED: 9:20 am  MESSENGER (representative from lab): Rosann Auerbach  MD NOTIFIED: Dr. Mickeal Skinner  TIME OF NOTIFICATION: 9:25 am  RESPONSE: MD to advise.

## 2020-12-18 NOTE — Progress Notes (Signed)
Per Dr. Mickeal Skinner, [facility] to treat with today's labs

## 2020-12-18 NOTE — Patient Instructions (Signed)
[facility] Discharge Instructions for Patients Receiving Chemotherapy  Today you received the following immunotherapy agent: Bevacizumab  To help prevent nausea and vomiting after your treatment, we encourage you to take your nausea medication As directed by your MD.   If you develop nausea and vomiting that is not controlled by your nausea medication, call the clinic.   BELOW ARE SYMPTOMS THAT SHOULD BE REPORTED IMMEDIATELY:  *FEVER GREATER THAN 100.5 F  *CHILLS WITH OR WITHOUT FEVER  NAUSEA AND VOMITING THAT IS NOT CONTROLLED WITH YOUR NAUSEA MEDICATION  *UNUSUAL SHORTNESS OF BREATH  *UNUSUAL BRUISING OR BLEEDING  TENDERNESS IN MOUTH AND THROAT WITH OR WITHOUT PRESENCE OF ULCERS  *URINARY PROBLEMS  *BOWEL PROBLEMS  UNUSUAL RASH Items with * indicate a potential emergency and should be followed up as soon as possible.  Feel free to call the clinic should you have any questions or concerns. The clinic phone number is [phone].  Please show the [facility] at check-in to the Emergency Department and triage nurse.

## 2020-12-19 ENCOUNTER — Telehealth: Payer: Self-pay | Admitting: Internal Medicine

## 2020-12-19 LAB — URINE CULTURE: Culture: >=100000 — AB

## 2020-12-19 NOTE — Telephone Encounter (Signed)
Scheduled per 02/22 los, patient has been called and voicemail was left.

## 2020-12-20 ENCOUNTER — Other Ambulatory Visit: Payer: Self-pay | Admitting: Radiation Therapy

## 2020-12-22 ENCOUNTER — Encounter: Payer: Self-pay | Admitting: Internal Medicine

## 2020-12-24 ENCOUNTER — Other Ambulatory Visit: Payer: Self-pay | Admitting: Internal Medicine

## 2020-12-24 ENCOUNTER — Encounter: Payer: Self-pay | Admitting: Internal Medicine

## 2020-12-24 MED ORDER — CIPROFLOXACIN HCL 500 MG PO TABS: 500.0000 mg | ORAL_TABLET | Freq: Two times a day (BID) | ORAL | 0 refills | Status: DC

## 2020-12-24 MED FILL — CIPROFLOXACIN HCL 500 MG TA: 500 | 5 days supply | Qty: 10 | Fill #0

## 2020-12-27 DIAGNOSIS — K7402 Hepatic fibrosis, advanced fibrosis: Secondary | ICD-10-CM | POA: Diagnosis not present

## 2020-12-27 DIAGNOSIS — K7581 Nonalcoholic steatohepatitis (NASH): Secondary | ICD-10-CM | POA: Diagnosis not present

## 2020-12-28 ENCOUNTER — Other Ambulatory Visit: Payer: Self-pay | Admitting: Nurse Practitioner

## 2020-12-28 DIAGNOSIS — K7581 Nonalcoholic steatohepatitis (NASH): Secondary | ICD-10-CM

## 2020-12-28 DIAGNOSIS — K7402 Hepatic fibrosis, advanced fibrosis: Secondary | ICD-10-CM

## 2020-12-29 ENCOUNTER — Ambulatory Visit (HOSPITAL_COMMUNITY)
Admission: RE | Admit: 2020-12-29 | Discharge: 2020-12-29 | Disposition: A | Discharge status: Home / Self Care | Payer: 59 | Source: Ambulatory Visit | Attending: Internal Medicine | Admitting: Internal Medicine

## 2020-12-29 DIAGNOSIS — C712 Malignant neoplasm of temporal lobe: Secondary | ICD-10-CM | POA: Diagnosis not present

## 2020-12-29 DIAGNOSIS — C719 Malignant neoplasm of brain, unspecified: Secondary | ICD-10-CM | POA: Diagnosis not present

## 2020-12-29 DIAGNOSIS — R220 Localized swelling, mass and lump, head: Secondary | ICD-10-CM | POA: Diagnosis not present

## 2020-12-29 DIAGNOSIS — I619 Nontraumatic intracerebral hemorrhage, unspecified: Secondary | ICD-10-CM | POA: Diagnosis not present

## 2020-12-29 DIAGNOSIS — C711 Malignant neoplasm of frontal lobe: Secondary | ICD-10-CM | POA: Diagnosis not present

## 2020-12-29 DIAGNOSIS — G9389 Other specified disorders of brain: Secondary | ICD-10-CM | POA: Diagnosis not present

## 2020-12-29 MED ORDER — GADOBUTROL 1 MMOL/ML IV SOLN
8.0000 mL | Freq: Once | INTRAVENOUS | Status: AC | PRN
  Administered 2020-12-29: 8 mL via INTRAVENOUS

## 2020-12-31 ENCOUNTER — Inpatient Hospital Stay: Payer: 59 | Attending: Internal Medicine

## 2020-12-31 ENCOUNTER — Encounter: Payer: Self-pay | Admitting: Internal Medicine

## 2020-12-31 DIAGNOSIS — Z5112 Encounter for antineoplastic immunotherapy: Secondary | ICD-10-CM | POA: Insufficient documentation

## 2020-12-31 DIAGNOSIS — G4733 Obstructive sleep apnea (adult) (pediatric): Secondary | ICD-10-CM | POA: Insufficient documentation

## 2020-12-31 DIAGNOSIS — Z833 Family history of diabetes mellitus: Secondary | ICD-10-CM | POA: Insufficient documentation

## 2020-12-31 DIAGNOSIS — Z8249 Family history of ischemic heart disease and other diseases of the circulatory system: Secondary | ICD-10-CM | POA: Insufficient documentation

## 2020-12-31 DIAGNOSIS — Z7952 Long term (current) use of systemic steroids: Secondary | ICD-10-CM | POA: Insufficient documentation

## 2020-12-31 DIAGNOSIS — Z79899 Other long term (current) drug therapy: Secondary | ICD-10-CM | POA: Insufficient documentation

## 2020-12-31 DIAGNOSIS — Z7901 Long term (current) use of anticoagulants: Secondary | ICD-10-CM | POA: Insufficient documentation

## 2020-12-31 DIAGNOSIS — C712 Malignant neoplasm of temporal lobe: Secondary | ICD-10-CM | POA: Insufficient documentation

## 2020-12-31 DIAGNOSIS — Z9221 Personal history of antineoplastic chemotherapy: Secondary | ICD-10-CM | POA: Insufficient documentation

## 2020-12-31 DIAGNOSIS — Z923 Personal history of irradiation: Secondary | ICD-10-CM | POA: Insufficient documentation

## 2020-12-31 DIAGNOSIS — Z801 Family history of malignant neoplasm of trachea, bronchus and lung: Secondary | ICD-10-CM | POA: Insufficient documentation

## 2021-01-01 ENCOUNTER — Inpatient Hospital Stay: Payer: 59

## 2021-01-01 ENCOUNTER — Inpatient Hospital Stay (HOSPITAL_BASED_OUTPATIENT_CLINIC_OR_DEPARTMENT_OTHER): Payer: 59 | Admitting: Internal Medicine

## 2021-01-01 ENCOUNTER — Telehealth: Payer: Self-pay | Admitting: *Deleted

## 2021-01-01 ENCOUNTER — Telehealth: Payer: Self-pay

## 2021-01-01 ENCOUNTER — Other Ambulatory Visit: Payer: Self-pay

## 2021-01-01 VITALS — BP 128/91 | HR 61 | Temp 97.0°F | Resp 17 | Ht 66.0 in

## 2021-01-01 DIAGNOSIS — Z8249 Family history of ischemic heart disease and other diseases of the circulatory system: Secondary | ICD-10-CM | POA: Diagnosis not present

## 2021-01-01 DIAGNOSIS — Z7901 Long term (current) use of anticoagulants: Secondary | ICD-10-CM | POA: Diagnosis not present

## 2021-01-01 DIAGNOSIS — Z9221 Personal history of antineoplastic chemotherapy: Secondary | ICD-10-CM | POA: Diagnosis not present

## 2021-01-01 DIAGNOSIS — C712 Malignant neoplasm of temporal lobe: Secondary | ICD-10-CM

## 2021-01-01 DIAGNOSIS — Z801 Family history of malignant neoplasm of trachea, bronchus and lung: Secondary | ICD-10-CM | POA: Diagnosis not present

## 2021-01-01 DIAGNOSIS — G4733 Obstructive sleep apnea (adult) (pediatric): Secondary | ICD-10-CM | POA: Diagnosis not present

## 2021-01-01 DIAGNOSIS — Z7952 Long term (current) use of systemic steroids: Secondary | ICD-10-CM | POA: Diagnosis not present

## 2021-01-01 DIAGNOSIS — Z833 Family history of diabetes mellitus: Secondary | ICD-10-CM | POA: Diagnosis not present

## 2021-01-01 DIAGNOSIS — Z79899 Other long term (current) drug therapy: Secondary | ICD-10-CM | POA: Diagnosis not present

## 2021-01-01 DIAGNOSIS — Z5112 Encounter for antineoplastic immunotherapy: Secondary | ICD-10-CM | POA: Diagnosis not present

## 2021-01-01 DIAGNOSIS — Z923 Personal history of irradiation: Secondary | ICD-10-CM | POA: Diagnosis not present

## 2021-01-01 LAB — CMP (CANCER CENTER ONLY)
ALT: 367 U/L (ref 0–44)
AST: 95 U/L — ABNORMAL HIGH (ref 15–41)
Albumin: 3.8 g/dL (ref 3.5–5.0)
Alkaline Phosphatase: 48 U/L (ref 38–126)
Anion gap: 11 (ref 5–15)
BUN: 16 mg/dL (ref 8–23)
CO2: 20 mmol/L — ABNORMAL LOW (ref 22–32)
Calcium: 8.9 mg/dL (ref 8.9–10.3)
Chloride: 108 mmol/L (ref 98–111)
Creatinine: 0.71 mg/dL (ref 0.44–1.00)
GFR, Estimated: >60 mL/min (ref >60)
Glucose, Bld: 121 mg/dL — ABNORMAL HIGH (ref 70–99)
Potassium: 3.9 mmol/L (ref 3.5–5.1)
Sodium: 139 mmol/L (ref 135–145)
Total Bilirubin: 2.3 mg/dL — ABNORMAL HIGH (ref 0.3–1.2)
Total Protein: 6.6 g/dL (ref 6.5–8.1)

## 2021-01-01 LAB — CBC WITH DIFFERENTIAL (CANCER CENTER ONLY)
Abs Immature Granulocytes: 0.23 10*3/uL — ABNORMAL HIGH (ref 0.00–0.07)
Basophils Absolute: 0 10*3/uL (ref 0.0–0.1)
Basophils Relative: 1 %
Eosinophils Absolute: 0 10*3/uL (ref 0.0–0.5)
Eosinophils Relative: 0 %
HCT: 43 % (ref 36.0–46.0)
Hemoglobin: 15.7 g/dL — ABNORMAL HIGH (ref 12.0–15.0)
Immature Granulocytes: 4 %
Lymphocytes Relative: 7 %
Lymphs Abs: 0.4 10*3/uL — ABNORMAL LOW (ref 0.7–4.0)
MCH: 36 pg — ABNORMAL HIGH (ref 26.0–34.0)
MCHC: 36.5 g/dL — ABNORMAL HIGH (ref 30.0–36.0)
MCV: 98.6 fL (ref 80.0–100.0)
Monocytes Absolute: 0.2 10*3/uL (ref 0.1–1.0)
Monocytes Relative: 4 %
Neutro Abs: 4.5 10*3/uL (ref 1.7–7.7)
Neutrophils Relative %: 84 %
Platelet Count: 84 10*3/uL — ABNORMAL LOW (ref 150–400)
RBC: 4.36 MIL/uL (ref 3.87–5.11)
RDW: 13.4 % (ref 11.5–15.5)
WBC Count: 5.3 10*3/uL (ref 4.0–10.5)
nRBC: 0 % (ref 0.0–0.2)

## 2021-01-01 MED ORDER — SODIUM CHLORIDE 0.9 % IV SOLN
Freq: Once | INTRAVENOUS | Status: AC
  Filled 2021-01-01: qty 250

## 2021-01-01 MED ORDER — SODIUM CHLORIDE 0.9 % IV SOLN
10.0000 mg/kg | Freq: Once | INTRAVENOUS | Status: AC
  Administered 2021-01-01: 800 mg via INTRAVENOUS
  Filled 2021-01-01: qty 32

## 2021-01-01 NOTE — Progress Notes (Signed)
[facility] [place], [place] [address] [phone]  Interval Evaluation  Date of Service: 01/01/21 Patient Name: Terry Bell Patient MRN: [id number] Patient DOB: 05/01/1956 Provider: Ventura Sellers, MD  Identifying Statement:  Terry Bell is a 65 y.o. female with right temporal glioblastoma   Referring Provider: Martinique, Betty G, MD [place],  [place] [address]  Oncologic History: Oncology History  Glioblastoma multiforme of temporal lobe [facility])  04/19/2020 Surgery   Craniotomy, right temporal resection with Dr. Christella Noa   05/28/2020 - 07/09/2020 Radiation Therapy   IMRT with concurrent Temodar 25m/m2   05/29/2020 -  Chemotherapy    Patient is on Treatment Plan: BRAIN GLIOBLASTOMA CONSOLIDATION TEMOZOLOMIDE DAYS 1-5 Q28 DAYS    Patient is on Antibody Plan: BRAIN GBM BEVACIZUMAB 14D X 6 CYCLES    11/05/2020 -  Chemotherapy    Patient is on Treatment Plan: BRAIN GLIOBLASTOMA CONSOLIDATION TEMOZOLOMIDE DAYS 1-5 Q28 DAYS    Patient is on Antibody Plan: BRAIN GBM BEVACIZUMAB 14D X 6 CYCLES      Biomarkers:  MGMT Methylated.  IDH 1/2 Wild type.  EGFR Not expressed  TERT "Mutated   Interval History:  Terry WANTpresents today for avastin infusion. She describes no changes with left sided weakness today, after recent decline noted last visit.  Decadron continues at 841mdaily, unchanged from prior.  Still doing some ambulating with the walker, requiring help for more ADLs.  Overall no issues or concerns with avastin infusion, tolerability.  She would like to re-engage with home PT for more support.  Dexamethasone 11/20/20: 39m58m2/22/22: 8mg93m+P (04/26/20) Patient presented last week with several days of progressive new-onset frontal headache.  This may be have been associated with change in taste, but no other neurologic complaints.  CNS imaging demonstrated a large right temporal mass.  She underwent  resection on 04/19/20 with Dr. CabbChristella Noas discharged on 6/27.  She has no complaints today aside from some residual pain at the surgical site.  Does complain of "hole in vision" on the left side, but otherwise no neurologic deficits.  Taking decadron 39mg 539mce per day.  Medications: Current Outpatient Medications on File Prior to Visit  Medication Sig Dispense Refill  . acetaminophen (TYLENOL) 325 MG tablet Take 650 mg by mouth every 6 (six) hours as needed. Per pt, not to exceed 2G in a given day    . Cholecalciferol (VITAMIN D) 1000 UNITS capsule Take 1,000 Units by mouth 2 (two) times daily.      . dexMarland Kitchenmethasone (DECADRON) 4 MG tablet Take 1 tablet (4 mg total) by mouth 2 (two) times daily with a meal. 60 tablet 1  . furosemide (LASIX) 20 MG tablet Take 1 tablet (20 mg total) by mouth daily. (Patient not taking: Reported on 12/18/2020) 30 tablet 1  . levETIRAcetam (KEPPRA) 500 MG tablet Take 1 tablet (500 mg total) by mouth 2 (two) times daily. 60 tablet 3  . ondansetron (ZOFRAN) 8 MG tablet Take 1 tablet (8 mg total) by mouth 2 (two) times daily as needed (nausea and vomiting). May take 30-60 minutes prior to Temodar administration if nausea/vomiting occurs. 30 tablet 1  . pantoprazole (PROTONIX) 40 MG tablet Take 1 tablet (40 mg total) by mouth daily. 30 tablet 3  . polyethylene glycol (MIRALAX / GLYCOLAX) 17 g packet Take 17 g by mouth daily.    . Probiotic Product (ALIGN) 4 MG CAPS Take  4 mg by mouth daily.     . rivaroxaban (XARELTO) 20 MG TABS tablet Take 1 tablet (20 mg total) by mouth daily with supper. 30 tablet 3  . temozolomide (TEMODAR) 140 MG capsule Take 2 capsules (280 mg total) by mouth daily. May take on an empty stomach to decrease nausea & vomiting. (Patient not taking: Reported on 12/18/2020) 10 capsule 0   No current facility-administered medications on file prior to visit.    Allergies:  Allergies  Allergen Reactions  . Niacin Hives and Rash   Past Medical History:   Past Medical History:  Diagnosis Date  . Arthritis   . Chronic nonalcoholic liver disease   . Complication of anesthesia   . Continuous leakage of urine   . Depression    situational  . Diverticulosis   . OSA (obstructive sleep apnea) 07/22/2016  . PONV (postoperative nausea and vomiting)   . Steatohepatitis   . Tinnitus   . Varicose veins    Past Surgical History:  Past Surgical History:  Procedure Laterality Date  . APPLICATION OF CRANIAL NAVIGATION N/A 04/19/2020   Procedure: APPLICATION OF CRANIAL NAVIGATION;  Surgeon: Ashok Pall, MD;  Location: [facility];  Service: Neurosurgery;  Laterality: N/A;  . arthroscopic left knee sugery    . bilateral bunionectomy    . BREAST BIOPSY Left   . CHOLECYSTECTOMY    . colonsocopy    . CRANIOTOMY Right 04/19/2020   Procedure: Right Temporal craniotomy for tumor resection;  Surgeon: Ashok Pall, MD;  Location: [facility];  Service: Neurosurgery;  Laterality: Right;  . debridement and removal and lateral release of torn cartlidge in rt knee    . gum grating    . rt knee arthroscopy    . torn cartledge in rt knee     Social History:  Social History   Socioeconomic History  . Marital status: Married    Spouse name: Not on file  . Number of children: Not on file  . Years of education: Not on file  . Highest education level: Not on file  Occupational History  . 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 Smoking status: Never Smoker  . Smokeless tobacco: Never Used  Substance and Sexual Activity  . Alcohol use: No  . Drug use: No  . Sexual activity: Not on file  Other Topics Concern  . 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 Diabetes Mother   . Liver disease Mother   . Diabetes Sister   . Liver disease Sister   . Hypertension Other   . Cancer Other   . Hemochromatosis Other   . Cancer Father        Throat  . Hypertension Brother   . Colon cancer Neg Hx   . Breast cancer Neg Hx     Review of Systems: Constitutional: Doesn't report fevers, chills or abnormal weight loss Eyes: Doesn't report blurriness of vision Ears, nose, mouth, throat, and face: Doesn't report sore throat Respiratory: Doesn't report cough,  dyspnea or wheezes Cardiovascular: Doesn't report palpitation, chest discomfort  Gastrointestinal:  Doesn't report nausea, constipation, diarrhea GU: Doesn't report incontinence Skin: Doesn't report skin rashes Neurological: Per HPI Musculoskeletal: Doesn't report joint pain Behavioral/Psych: Doesn't report anxiety  Physical Exam: Vitals:   01/01/21 1146  BP: (!) 128/91  Pulse: 61  Resp: 17  Temp: (!) 97 F (36.1 C)  SpO2: 96%   KPS: 60. General: Alert, cooperative, pleasant, in no acute distress Head: Normal EENT: No conjunctival injection or scleral icterus.  Lungs: Resp effort normal Cardiac: Regular rate Abdomen: Non-distended abdomen Skin: No rashes cyanosis or petechiae. Extremities: No clubbing or edema  Neurologic Exam: Mental Status: Awake, alert, attentive to examiner. Oriented to self and environment. Language is fluent with intact comprehension.  Mild motor and sensory neglect.  Cranial Nerves: Visual acuity is grossly normal. Left hemianopia. Extra-ocular movements intact. No ptosis. Face is symmetric Motor: Tone and bulk are normal. Power is 4/5 in left arm and leg, with fine motor dystaxia. Reflexes are symmetric, no pathologic reflexes present.  Sensory: Extinguishes left leg and arm. Gait: Deferred   Labs: I have reviewed the data as listed     Component Value Date/Time   NA 137 12/18/2020 0825   K 3.9 12/18/2020 0825   CL 108 12/18/2020 0825   CO2 20 (L) 12/18/2020 0825   GLUCOSE 103 (H) 12/18/2020 0825   BUN 16 12/18/2020 0825   CREATININE 0.68 12/18/2020 0825   CALCIUM 9.1 12/18/2020 0825   PROT 6.5 12/18/2020 0825   ALBUMIN 3.7 12/18/2020 0825   AST 72 (H) 12/18/2020 0825   ALT 292 (HH) 12/18/2020 0825   ALKPHOS 48 12/18/2020 0825   BILITOT 1.9 (H) 12/18/2020 0825   GFRNONAA >60 12/18/2020 0825   GFRAA >60 07/30/2020 1202   Lab Results  Component Value Date   WBC 5.3 01/01/2021   NEUTROABS 4.5 01/01/2021   HGB 15.7 (H) 01/01/2021   HCT 43.0 01/01/2021   MCV 98.6 01/01/2021   PLT 84 (L) 01/01/2021   Imaging:  [facility] Clinician Interpretation: I have personally reviewed the CNS images as listed.  My interpretation, in the context of the patient's clinical presentation, is stable disease  MR BRAIN W WO CONTRAST  Result Date: 12/30/2020 CLINICAL DATA:  Glioblastoma, follow-up; post chemo radiation currently on bevacizumab EXAM: MRI HEAD WITHOUT AND WITH CONTRAST TECHNIQUE: Multiplanar, multiecho pulse sequences of the brain and surrounding structures were obtained without and with intravenous contrast. CONTRAST:  16m GADAVIST GADOBUTROL 1 MMOL/ML IV SOLN COMPARISON:  Most recent MRI 10/25/2020 FINDINGS: Brain: Substantial decrease in size and enhancement of irregular lesion centered within the right parietotemporal lobes with frontal and occipital extension. Soft tissue extending into the right lateral ventricle again identified particularly within the temporal horn. Decreased enhancement of the ependymal margin of the right lateral ventricle. Substantial reduction in abnormal T2 FLAIR hyperintensity in the right cerebral hemisphere. As a result, mass effect is significantly improved with resolution of midline shift and decreased trapping of the right temporal horn and medialization of the right uncus. T2 FLAIR hyperintensity  adjacent to the left frontal horn has slightly increased. Postoperative changes with right parietotemporal resection cavity again identified. There is no acute infarction. Resolution of minimal hemorrhage layering within the left occipital horn. Vascular: Major vessel flow voids at the skull base are preserved. Skull and upper cervical spine: Right craniotomy. Normal marrow signal is preserved. Sinuses/Orbits: Paranasal sinuses are aerated. Orbits are unremarkable. Other: Sella is unremarkable.  Mastoid air cells  are clear. IMPRESSION: Substantial improvement in size and enhancement of right cerebral lesion and surrounding abnormal T2 FLAIR hyperintensity in the setting of bevacizumab therapy. Resulting significant improvement in mass effect. Nonspecific slight increase in T2 FLAIR hyperintensity adjacent to the left frontal horn. Electronically Signed   By: Macy Mis M.D.   On: 12/30/2020 20:34   Assessment/Plan Glioblastoma multiforme of temporal lobe ([facility]) [C71.2]   Terry Bell is clinically and radiographically stable today.  MRI demonstrates very good response thus far to avastin monotherapy.  Given quality of response, benefit of dosing Avastin outweighs risk of further hepatoxicity.  We recommended continuing with single agent IV avastin 16m/kg q2 weeks without cytotoxic chemotherapy.  Avastin should be held for the following:  ANC less than 500  Platelets less than 50,000  LFT or creatinine greater than 2x ULN  If clinical concerns/contraindications develop  Decadron should be gradually weaned, with goal of coming off steroids completely over the next 8 weeks or so.  Currently at 881mdaily.  Keppra should be continued at 50080mID, can increase if events recur again.  May con't PRN use of lasix for dependent edema.  Will re-order home physical therapy given increased burden of motor deficits over the past month.  We ask that Terry WIDEMANturn to clinic in 2 weeks with  labs prior to avastin infusion.   All questions were answered. The patient knows to call the clinic with any problems, questions or concerns. No barriers to learning were detected.  I have spent a total of 40 minutes of face-to-face and non-face-to-face time, excluding clinical staff time, preparing to see patient, ordering tests and/or medications, counseling the patient, and documenting clinical information in the electronic or other health record    ZacVentura SellersD Medical Director of Neuro-Oncology [facility] [place]/08/22 11:47 AM

## 2021-01-01 NOTE — Telephone Encounter (Signed)
CRITICAL VALUE STICKER  CRITICAL VALUE: ALT 367  RECEIVER (on-site recipient of call):Sandi K, RN  DATE & TIME NOTIFIED: 01/01/21; 1214  MESSENGER (representative from lab): Rosann Auerbach  MD NOTIFIED: Dr. Mickeal Skinner [place]: 1218  RESPONSE: Information acknowledged

## 2021-01-01 NOTE — Telephone Encounter (Signed)
Lab called with ALT 367. Dr. Mickeal Skinner made aware. Per Dr. Mickeal Skinner, ok to proceed with treatment with today's labs.

## 2021-01-03 ENCOUNTER — Encounter: Payer: Self-pay | Admitting: Internal Medicine

## 2021-01-14 ENCOUNTER — Telehealth: Payer: Self-pay

## 2021-01-14 ENCOUNTER — Inpatient Hospital Stay: Payer: 59

## 2021-01-14 ENCOUNTER — Other Ambulatory Visit: Payer: Self-pay

## 2021-01-14 ENCOUNTER — Inpatient Hospital Stay (HOSPITAL_BASED_OUTPATIENT_CLINIC_OR_DEPARTMENT_OTHER): Payer: 59 | Admitting: Internal Medicine

## 2021-01-14 VITALS — BP 114/91 | HR 67 | Temp 97.0°F | Resp 15 | Ht 66.0 in

## 2021-01-14 DIAGNOSIS — C712 Malignant neoplasm of temporal lobe: Secondary | ICD-10-CM

## 2021-01-14 DIAGNOSIS — R569 Unspecified convulsions: Secondary | ICD-10-CM | POA: Diagnosis not present

## 2021-01-14 DIAGNOSIS — Z801 Family history of malignant neoplasm of trachea, bronchus and lung: Secondary | ICD-10-CM | POA: Diagnosis not present

## 2021-01-14 DIAGNOSIS — Z9221 Personal history of antineoplastic chemotherapy: Secondary | ICD-10-CM | POA: Diagnosis not present

## 2021-01-14 DIAGNOSIS — Z79899 Other long term (current) drug therapy: Secondary | ICD-10-CM | POA: Diagnosis not present

## 2021-01-14 DIAGNOSIS — Z5112 Encounter for antineoplastic immunotherapy: Secondary | ICD-10-CM | POA: Diagnosis not present

## 2021-01-14 DIAGNOSIS — Z923 Personal history of irradiation: Secondary | ICD-10-CM | POA: Diagnosis not present

## 2021-01-14 DIAGNOSIS — G4733 Obstructive sleep apnea (adult) (pediatric): Secondary | ICD-10-CM | POA: Diagnosis not present

## 2021-01-14 DIAGNOSIS — Z7901 Long term (current) use of anticoagulants: Secondary | ICD-10-CM | POA: Diagnosis not present

## 2021-01-14 DIAGNOSIS — Z7952 Long term (current) use of systemic steroids: Secondary | ICD-10-CM | POA: Diagnosis not present

## 2021-01-14 LAB — CMP (CANCER CENTER ONLY)
ALT: 340 U/L (ref 0–44)
AST: 96 U/L — ABNORMAL HIGH (ref 15–41)
Albumin: 3.7 g/dL (ref 3.5–5.0)
Alkaline Phosphatase: 51 U/L (ref 38–126)
Anion gap: 11 (ref 5–15)
BUN: 20 mg/dL (ref 8–23)
CO2: 20 mmol/L — ABNORMAL LOW (ref 22–32)
Calcium: 9.5 mg/dL (ref 8.9–10.3)
Chloride: 106 mmol/L (ref 98–111)
Creatinine: 0.68 mg/dL (ref 0.44–1.00)
GFR, Estimated: >60 mL/min (ref >60)
Glucose, Bld: 136 mg/dL — ABNORMAL HIGH (ref 70–99)
Potassium: 4.2 mmol/L (ref 3.5–5.1)
Sodium: 137 mmol/L (ref 135–145)
Total Bilirubin: 2.5 mg/dL — ABNORMAL HIGH (ref 0.3–1.2)
Total Protein: 6.9 g/dL (ref 6.5–8.1)

## 2021-01-14 LAB — CBC WITH DIFFERENTIAL (CANCER CENTER ONLY)
Abs Immature Granulocytes: 0.15 10*3/uL — ABNORMAL HIGH (ref 0.00–0.07)
Basophils Absolute: 0 10*3/uL (ref 0.0–0.1)
Basophils Relative: 0 %
Eosinophils Absolute: 0 10*3/uL (ref 0.0–0.5)
Eosinophils Relative: 0 %
HCT: 42.4 % (ref 36.0–46.0)
Hemoglobin: 15.3 g/dL — ABNORMAL HIGH (ref 12.0–15.0)
Immature Granulocytes: 3 %
Lymphocytes Relative: 5 %
Lymphs Abs: 0.3 10*3/uL — ABNORMAL LOW (ref 0.7–4.0)
MCH: 35.9 pg — ABNORMAL HIGH (ref 26.0–34.0)
MCHC: 36.1 g/dL — ABNORMAL HIGH (ref 30.0–36.0)
MCV: 99.5 fL (ref 80.0–100.0)
Monocytes Absolute: 0.2 10*3/uL (ref 0.1–1.0)
Monocytes Relative: 4 %
Neutro Abs: 4.8 10*3/uL (ref 1.7–7.7)
Neutrophils Relative %: 88 %
Platelet Count: 82 10*3/uL — ABNORMAL LOW (ref 150–400)
RBC: 4.26 MIL/uL (ref 3.87–5.11)
RDW: 13.2 % (ref 11.5–15.5)
WBC Count: 5.6 10*3/uL (ref 4.0–10.5)
nRBC: 0 % (ref 0.0–0.2)

## 2021-01-14 LAB — PROTEIN / CREATININE RATIO, URINE
Creatinine, Urine: 168.24 mg/dL
Protein Creatinine Ratio: 0.07 mg/mg{Cre} (ref 0.00–0.15)
Total Protein, Urine: 11 mg/dL

## 2021-01-14 MED ORDER — SODIUM CHLORIDE 0.9 % IV SOLN
Freq: Once | INTRAVENOUS | Status: AC
  Filled 2021-01-14: qty 250

## 2021-01-14 MED ORDER — SODIUM CHLORIDE 0.9 % IV SOLN
10.0000 mg/kg | Freq: Once | INTRAVENOUS | Status: AC
  Administered 2021-01-14: 800 mg via INTRAVENOUS
  Filled 2021-01-14: qty 32

## 2021-01-14 NOTE — Progress Notes (Signed)
Per Dr Mickeal Skinner, ok to proceed without waiting for urine protein results

## 2021-01-14 NOTE — Progress Notes (Signed)
Per MD Vaslow, ok to treat with AST, ALT and Bilirubin today. Pt has chronic hepatitis.

## 2021-01-14 NOTE — Progress Notes (Signed)
[facility] at [place], [place] [address] [phone]  Interval Evaluation  Date of Service: 01/14/21 Patient Name: Terry Bell Patient MRN: [id number] Patient DOB: April 08, 1956 Provider: Ventura Sellers, MD  Identifying Statement:  Terry Bell is a 65 y.o. female with right temporal glioblastoma   Referring Provider: Martinique, Betty G, MD [place],  [place] [address]  Oncologic History: Oncology History  Glioblastoma multiforme of temporal lobe [facility])  04/19/2020 Surgery   Craniotomy, right temporal resection with Dr. Christella Noa   05/28/2020 - 07/09/2020 Radiation Therapy   IMRT with concurrent Temodar 75mg /m2   05/29/2020 -  Chemotherapy    Patient is on Treatment Plan: BRAIN GLIOBLASTOMA CONSOLIDATION TEMOZOLOMIDE DAYS 1-5 Q28 DAYS    Patient is on Antibody Plan: BRAIN GBM BEVACIZUMAB 14D X 6 CYCLES    11/05/2020 -  Chemotherapy    Patient is on Treatment Plan: BRAIN GLIOBLASTOMA CONSOLIDATION TEMOZOLOMIDE DAYS 1-5 Q28 DAYS    Patient is on Antibody Plan: BRAIN GBM BEVACIZUMAB 14D X 6 CYCLES      Biomarkers:  MGMT Methylated.  IDH 1/2 Wild type.  EGFR Not expressed  TERT "Mutated   Interval History:  Terry Bell presents today for avastin infusion. She describes no changes with left sided weakness today.  Decadron has been decreased down to 4mg  daily, with good tolerance.  Still doing some ambulating with the walker, requiring help for more ADLs.  Overall no issues or concerns with avastin infusion, tolerability.  Does report mild rash on abdomen, breasts, and arms. Continues to work with PT as prior.  Dexamethasone 11/20/20: 4mg  12/18/20: 8mg  01/14/21: 4mg   H+P (04/26/20) Patient presented last week with several days of progressive new-onset frontal headache.  This may be have been associated with change in taste, but no other neurologic complaints.  CNS imaging demonstrated a large right temporal  mass.  She underwent resection on 04/19/20 with Dr. Christella Noa, was discharged on 6/27.  She has no complaints today aside from some residual pain at the surgical site.  Does complain of "hole in vision" on the left side, but otherwise no neurologic deficits.  Taking decadron 4mg  twice per day.  Medications: Current Outpatient Medications on File Prior to Visit  Medication Sig Dispense Refill  . acetaminophen (TYLENOL) 325 MG tablet Take 650 mg by mouth every 6 (six) hours as needed. Per pt, not to exceed 2G in a given day    . Cholecalciferol (VITAMIN D) 1000 UNITS capsule Take 1,000 Units by mouth 2 (two) times daily.      Marland Kitchen dexamethasone (DECADRON) 4 MG tablet Take 1 tablet (4 mg total) by mouth 2 (two) times daily with a meal. 60 tablet 1  . furosemide (LASIX) 20 MG tablet Take 1 tablet (20 mg total) by mouth daily. 30 tablet 1  . levETIRAcetam (KEPPRA) 500 MG tablet Take 1 tablet (500 mg total) by mouth 2 (two) times daily. 60 tablet 3  . pantoprazole (PROTONIX) 40 MG tablet Take 1 tablet (40 mg total) by mouth daily. 30 tablet 3  . Probiotic Product (ALIGN) 4 MG CAPS Take 4 mg by mouth daily.     . rivaroxaban (XARELTO) 20 MG TABS tablet Take 1 tablet (20 mg total) by mouth daily with supper. 30 tablet 3  . ondansetron (ZOFRAN) 8 MG tablet Take 1 tablet (8 mg total) by mouth 2 (two) times daily as needed (nausea and vomiting). May take 30-60 minutes  prior to Temodar administration if nausea/vomiting occurs. (Patient not taking: Reported on 01/14/2021) 30 tablet 1  . polyethylene glycol (MIRALAX / GLYCOLAX) 17 g packet Take 17 g by mouth daily. (Patient not taking: Reported on 01/14/2021)    . temozolomide (TEMODAR) 140 MG capsule Take 2 capsules (280 mg total) by mouth daily. May take on an empty stomach to decrease nausea & vomiting. (Patient not taking: No sig reported) 10 capsule 0   No current facility-administered medications on file prior to visit.    Allergies:  Allergies  Allergen  Reactions  . Niacin Hives and Rash   Past Medical History:  Past Medical History:  Diagnosis Date  . Arthritis   . Chronic nonalcoholic liver disease   . Complication of anesthesia   . Continuous leakage of urine   . Depression    situational  . Diverticulosis   . OSA (obstructive sleep apnea) 07/22/2016  . PONV (postoperative nausea and vomiting)   . Steatohepatitis   . Tinnitus   . Varicose veins    Past Surgical History:  Past Surgical History:  Procedure Laterality Date  . APPLICATION OF CRANIAL NAVIGATION N/A 04/19/2020   Procedure: APPLICATION OF CRANIAL NAVIGATION;  Surgeon: Coletta Memos, MD;  Location: [facility] OR;  Service: Neurosurgery;  Laterality: N/A;  . arthroscopic left knee sugery    . bilateral bunionectomy    . BREAST BIOPSY Left   . CHOLECYSTECTOMY    . colonsocopy    . CRANIOTOMY Right 04/19/2020   Procedure: Right Temporal craniotomy for tumor resection;  Surgeon: Coletta Memos, MD;  [facility] OR;  Service: Neurosurgery;  Laterality: Right;  . debridement and removal and lateral release of torn cartlidge in rt knee    . gum grating    . rt knee arthroscopy    . torn cartledge in rt knee     Social History:  Social History   Socioeconomic History  . Marital status: Married    Spouse name: Not on file  . Number of children: Not on file  . Years of education: Not on file  . Highest education level: Not on file  Occupational History  . 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 Smoking status: Never Smoker  . Smokeless tobacco: Never Used  Substance and Sexual Activity  . Alcohol use: No  . Drug use: No  . Sexual activity: Not on file  Other Topics Concern  . 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 Diabetes Mother   . Liver disease Mother   . Diabetes Sister   . Liver disease Sister   . Hypertension Other   . Cancer Other   . Hemochromatosis Other   . Cancer Father        Throat  . Hypertension Brother   . Colon cancer Neg Hx   . Breast cancer Neg Hx     Review of Systems: Constitutional: Doesn't report fevers, chills or abnormal weight loss Eyes: Doesn't report blurriness of vision Ears, nose, mouth, throat,  and face: Doesn't report sore throat Respiratory: Doesn't report cough, dyspnea or wheezes Cardiovascular: Doesn't report palpitation, chest discomfort  Gastrointestinal:  Doesn't report nausea, constipation, diarrhea GU: Doesn't report incontinence Skin: Doesn't report skin rashes Neurological: Per HPI Musculoskeletal: Doesn't report joint pain Behavioral/Psych: Doesn't report anxiety  Physical Exam: Vitals:   01/14/21 1151  BP: (!) 114/91  Pulse: 67  Resp: 15  Temp: (!) 97 F (36.1 C)  SpO2: 99%   KPS: 60. General: Alert, cooperative, pleasant, in no acute distress Head: Normal EENT: No conjunctival injection or scleral icterus.  Lungs: Resp effort normal Cardiac: Regular rate Abdomen: Non-distended abdomen Skin: No rashes cyanosis or petechiae. Extremities: No clubbing or edema  Neurologic Exam: Mental Status: Awake, alert, attentive to examiner. Oriented to self and environment. Language is fluent with intact comprehension.  Mild motor and sensory neglect.  Cranial Nerves: Visual acuity is grossly normal. Left hemianopia. Extra-ocular movements intact. No ptosis. Face is symmetric Motor: Tone and bulk are normal. Power is 4/5 in left arm and leg, with fine motor dystaxia. Reflexes are symmetric, no pathologic reflexes present.  Sensory: Extinguishes left leg and arm. Gait:  Deferred   Labs: I have reviewed the data as listed    Component Value Date/Time   NA 139 01/01/2021 1114   K 3.9 01/01/2021 1114   CL 108 01/01/2021 1114   CO2 20 (L) 01/01/2021 1114   GLUCOSE 121 (H) 01/01/2021 1114   BUN 16 01/01/2021 1114   CREATININE 0.71 01/01/2021 1114   CALCIUM 8.9 01/01/2021 1114   PROT 6.6 01/01/2021 1114   ALBUMIN 3.8 01/01/2021 1114   AST 95 (H) 01/01/2021 1114   ALT 367 (HH) 01/01/2021 1114   ALKPHOS 48 01/01/2021 1114   BILITOT 2.3 (H) 01/01/2021 1114   GFRNONAA >60 01/01/2021 1114   GFRAA >60 07/30/2020 1202   Lab Results  Component Value Date   WBC 5.6 01/14/2021   NEUTROABS 4.8 01/14/2021   HGB 15.3 (H) 01/14/2021   HCT 42.4 01/14/2021   MCV 99.5 01/14/2021   PLT 82 (L) 01/14/2021    Assessment/Plan Glioblastoma multiforme of temporal lobe ([place]) [C71.2]   Terry Bell is clinically stable today.  No new or progressive deficits.  We recommended continuing with single agent IV avastin $RemoveBe'10mg'hgcCpThnM$ /kg q2 weeks without cytotoxic chemotherapy.  We understand she has chronic hepatitis, liver enzymes are elevated but not increasing over past month.  Avastin should be held for the following:  ANC less than 500  Platelets less than 50,000  LFT or creatinine greater than 2x ULN  If clinical concerns/contraindications develop  Decadron should be gradually weaned, with goal of coming off steroids completely over the next month.  Currently at $RemoveBefo'4mg'GTtjNMRYmOo$  daily.  Keppra should be continued at $RemoveBefo'500mg'QNiVtxurMDS$  BID, can increase if events recur again.  May con't PRN use of lasix for dependent edema.  We ask that Terry Bell return to clinic in 2 weeks with labs prior to avastin infusion.   All questions were answered. The patient knows to call the clinic with any problems, questions or concerns. No barriers to learning were detected.  I have spent a total of 30 minutes of face-to-face and non-face-to-face time, excluding clinical staff time, preparing to see  patient, ordering tests and/or medications, counseling the patient, and documenting clinical information in the electronic or other health record    Ventura Sellers, MD Medical Director of Neuro-Oncology [place] at [place] 01/14/21 12:06 PM

## 2021-01-14 NOTE — Telephone Encounter (Signed)
CRITICAL VALUE STICKER  CRITICAL VALUE: ALT: 340  RECEIVER (on-site recipient of call): Patty Sermons, RN  DATE & TIME NOTIFIED: 3/21 @ 1225  MESSENGER (representative from lab): Roanna Epley lab  MD NOTIFIED: Dr. Mickeal Skinner  TIME OF NOTIFICATION: 3/21 @ 12:26  RESPONSE: Critical lab value relayed to Dr. Renda Rolls nurse, Cloyde Reams. Dr. Mickeal Skinner currently in with patient. Cloyde Reams has notified MD.

## 2021-01-14 NOTE — Patient Instructions (Signed)
[facility] Discharge Instructions for Patients Receiving Chemotherapy  Today you received the following chemotherapy agents: bevacizumab  To help prevent nausea and vomiting after your treatment, we encourage you to take your nausea medication as directed.   If you develop nausea and vomiting that is not controlled by your nausea medication, call the clinic.   BELOW ARE SYMPTOMS THAT SHOULD BE REPORTED IMMEDIATELY:  *FEVER GREATER THAN 100.5 F  *CHILLS WITH OR WITHOUT FEVER  NAUSEA AND VOMITING THAT IS NOT CONTROLLED WITH YOUR NAUSEA MEDICATION  *UNUSUAL SHORTNESS OF BREATH  *UNUSUAL BRUISING OR BLEEDING  TENDERNESS IN MOUTH AND THROAT WITH OR WITHOUT PRESENCE OF ULCERS  *URINARY PROBLEMS  *BOWEL PROBLEMS  UNUSUAL RASH Items with * indicate a potential emergency and should be followed up as soon as possible.  Feel free to call the clinic should you have any questions or concerns. The clinic phone number is [phone].  Please show the CHEMO ALERT CARD at check-in to the Emergency Department and triage nurse.   

## 2021-01-15 MED FILL — XARELTO 20 MG TABLET: 20 | 30 days supply | Qty: 30 | Fill #1

## 2021-01-15 MED FILL — levETIRAcetam 500 MG TABS: 500 | 30 days supply | Qty: 60 | Fill #1

## 2021-01-16 ENCOUNTER — Telehealth: Payer: Self-pay | Admitting: Internal Medicine

## 2021-01-16 NOTE — Telephone Encounter (Signed)
Scheduled per 03/21 los, patient has been called and voicemail was left.

## 2021-01-23 ENCOUNTER — Encounter: Payer: Self-pay | Admitting: Internal Medicine

## 2021-01-23 ENCOUNTER — Telehealth: Payer: Self-pay | Admitting: *Deleted

## 2021-01-23 NOTE — Telephone Encounter (Signed)
Routed [facility] [employer] message to Dr Mickeal Skinner.  His reply:  Patient is very sensitive and gets adrenal suppression.  Husband should give Decadron 8 mg dose now and then resume 4 mg daily starting tomorrow 01/24/2021 and continue until next visit.  May consider hydrocortisone bridge for her.  Spoke with spouse Karn Pickler and he stated understanding.  Confirmed with him that they have enough medication to dose new instructions.  No further questions at this time.

## 2021-01-24 ENCOUNTER — Other Ambulatory Visit (HOSPITAL_COMMUNITY): Payer: Self-pay

## 2021-01-26 ENCOUNTER — Other Ambulatory Visit (HOSPITAL_COMMUNITY): Payer: Self-pay

## 2021-01-28 ENCOUNTER — Other Ambulatory Visit (HOSPITAL_COMMUNITY): Payer: Self-pay

## 2021-01-28 ENCOUNTER — Other Ambulatory Visit: Payer: Self-pay

## 2021-01-28 ENCOUNTER — Inpatient Hospital Stay (HOSPITAL_BASED_OUTPATIENT_CLINIC_OR_DEPARTMENT_OTHER): Payer: 59 | Admitting: Internal Medicine

## 2021-01-28 ENCOUNTER — Inpatient Hospital Stay: Payer: 59 | Attending: Internal Medicine

## 2021-01-28 ENCOUNTER — Inpatient Hospital Stay: Payer: 59

## 2021-01-28 VITALS — BP 133/96 | HR 71 | Temp 97.6°F | Resp 16 | Ht 66.0 in | Wt 190.0 lb

## 2021-01-28 DIAGNOSIS — Z7952 Long term (current) use of systemic steroids: Secondary | ICD-10-CM | POA: Diagnosis not present

## 2021-01-28 DIAGNOSIS — C712 Malignant neoplasm of temporal lobe: Secondary | ICD-10-CM

## 2021-01-28 DIAGNOSIS — R569 Unspecified convulsions: Secondary | ICD-10-CM

## 2021-01-28 DIAGNOSIS — Z7901 Long term (current) use of anticoagulants: Secondary | ICD-10-CM | POA: Diagnosis not present

## 2021-01-28 DIAGNOSIS — Z5112 Encounter for antineoplastic immunotherapy: Secondary | ICD-10-CM | POA: Diagnosis not present

## 2021-01-28 DIAGNOSIS — Z79899 Other long term (current) drug therapy: Secondary | ICD-10-CM | POA: Diagnosis not present

## 2021-01-28 LAB — CMP (CANCER CENTER ONLY)
ALT: 220 U/L — ABNORMAL HIGH (ref 0–44)
AST: 78 U/L — ABNORMAL HIGH (ref 15–41)
Albumin: 3.8 g/dL (ref 3.5–5.0)
Alkaline Phosphatase: 67 U/L (ref 38–126)
Anion gap: 13 (ref 5–15)
BUN: 12 mg/dL (ref 8–23)
CO2: 18 mmol/L — ABNORMAL LOW (ref 22–32)
Calcium: 9.2 mg/dL (ref 8.9–10.3)
Chloride: 106 mmol/L (ref 98–111)
Creatinine: 0.66 mg/dL (ref 0.44–1.00)
GFR, Estimated: >60 mL/min (ref >60)
Glucose, Bld: 124 mg/dL — ABNORMAL HIGH (ref 70–99)
Potassium: 4.2 mmol/L (ref 3.5–5.1)
Sodium: 137 mmol/L (ref 135–145)
Total Bilirubin: 1.8 mg/dL — ABNORMAL HIGH (ref 0.3–1.2)
Total Protein: 7.2 g/dL (ref 6.5–8.1)

## 2021-01-28 LAB — CBC WITH DIFFERENTIAL (CANCER CENTER ONLY)
Abs Immature Granulocytes: 0.18 10*3/uL — ABNORMAL HIGH (ref 0.00–0.07)
Basophils Absolute: 0 10*3/uL (ref 0.0–0.1)
Basophils Relative: 0 %
Eosinophils Absolute: 0 10*3/uL (ref 0.0–0.5)
Eosinophils Relative: 0 %
HCT: 42.4 % (ref 36.0–46.0)
Hemoglobin: 15.3 g/dL — ABNORMAL HIGH (ref 12.0–15.0)
Immature Granulocytes: 4 %
Lymphocytes Relative: 7 %
Lymphs Abs: 0.3 10*3/uL — ABNORMAL LOW (ref 0.7–4.0)
MCH: 35.7 pg — ABNORMAL HIGH (ref 26.0–34.0)
MCHC: 36.1 g/dL — ABNORMAL HIGH (ref 30.0–36.0)
MCV: 99.1 fL (ref 80.0–100.0)
Monocytes Absolute: 0.2 10*3/uL (ref 0.1–1.0)
Monocytes Relative: 5 %
Neutro Abs: 3.9 10*3/uL (ref 1.7–7.7)
Neutrophils Relative %: 84 %
Platelet Count: 126 10*3/uL — ABNORMAL LOW (ref 150–400)
RBC: 4.28 MIL/uL (ref 3.87–5.11)
RDW: 13.2 % (ref 11.5–15.5)
WBC Count: 4.6 10*3/uL (ref 4.0–10.5)
nRBC: 0 % (ref 0.0–0.2)

## 2021-01-28 MED ORDER — HYDROCORTISONE 10 MG PO TABS
ORAL_TABLET | ORAL | 3 refills | Status: AC
  Filled 2021-01-28: qty 90, 30d supply, fill #0
  Filled 2021-02-20: qty 90, 30d supply, fill #1

## 2021-01-28 MED ORDER — SODIUM CHLORIDE 0.9 % IV SOLN
Freq: Once | INTRAVENOUS | Status: AC
  Filled 2021-01-28: qty 250

## 2021-01-28 MED ORDER — SODIUM CHLORIDE 0.9 % IV SOLN
10.0000 mg/kg | Freq: Once | INTRAVENOUS | Status: AC
  Administered 2021-01-28: 800 mg via INTRAVENOUS
  Filled 2021-01-28: qty 32

## 2021-01-28 NOTE — Progress Notes (Signed)
OK to treat without urine protein and elevated liver enzymes per Dr. Mickeal Skinner

## 2021-01-28 NOTE — Progress Notes (Signed)
[facility] at [place], [place] [address] [phone]  Interval Evaluation  Date of Service: 01/28/21 Patient Name: Terry Bell Patient MRN: [id number] Patient DOB: 06-09-1956 Provider: Ventura Sellers, MD  Identifying Statement:  Terry Bell is a 65 y.o. female with right temporal glioblastoma   Referring Provider: Martinique, Betty G, MD [place],  [place] [address]  Oncologic History: Oncology History  Glioblastoma multiforme of temporal lobe [facility])  04/19/2020 Surgery   Craniotomy, right temporal resection with Dr. Christella Noa   05/28/2020 - 07/09/2020 Radiation Therapy   IMRT with concurrent Temodar 93m/m2   05/29/2020 -  Chemotherapy    Patient is on Treatment Plan: BRAIN GLIOBLASTOMA CONSOLIDATION TEMOZOLOMIDE DAYS 1-5 Q28 DAYS    Patient is on Antibody Plan: BRAIN GBM BEVACIZUMAB 14D X 6 CYCLES    11/05/2020 -  Chemotherapy    Patient is on Treatment Plan: BRAIN GLIOBLASTOMA CONSOLIDATION TEMOZOLOMIDE DAYS 1-5 Q28 DAYS    Patient is on Antibody Plan: BRAIN GBM BEVACIZUMAB 14D X 6 CYCLES      Biomarkers:  MGMT Methylated.  IDH 1/2 Wild type.  EGFR Not expressed  TERT "Mutated   Interval History:  Terry LINGLEpresents today for avastin infusion. She and her husband describe a difficult week, with significant lethargy and weakness of the left side shortly after decreasing decadron down to 239mdaily.  Currently she is back up to 37m89maily after improving back to baseline with 8mg82mlus.  Still doing some ambulating with the walker, requiring help for more ADLs.  Overall no issues or concerns with avastin infusion, tolerability.  Working hard with PT as prior.  Dexamethasone 11/20/20: 37mg 237m22/22: 8mg 050m1/22: 37mg  H79m(04/26/20) Patient presented last week with several days of progressive new-onset frontal headache.  This may be have been associated with change in taste, but no other neurologic  complaints.  CNS imaging demonstrated a large right temporal mass.  She underwent resection on 04/19/20 with Dr. CabbellChristella Noaischarged on 6/27.  She has no complaints today aside from some residual pain at the surgical site.  Does complain of "hole in vision" on the left side, but otherwise no neurologic deficits.  Taking decadron 37mg twi77mper day.  Medications: Current Outpatient Medications on File Prior to Visit  Medication Sig Dispense Refill  . acetaminophen (TYLENOL) 325 MG tablet Take 650 mg by mouth every 6 (six) hours as needed. Per pt, not to exceed 2G in a given day    . Cholecalciferol (VITAMIN D) 1000 UNITS capsule Take 1,000 Units by mouth 2 (two) times daily.      . ciprofloxacin (CIPRO) 500 MG tablet TAKE 1 TABLET (500 MG TOTAL) BY MOUTH 2 (TWO) TIMES DAILY FOR 5 DAYS. 10 tablet 0  . dexamethasone (DECADRON) 4 MG tablet TAKE 1 TABLET (4 MG TOTAL) BY MOUTH 2 (TWO) TIMES DAILY WITH A MEAL. 60 tablet 1  . furosemide (LASIX) 20 MG tablet TAKE 1 TABLET (20 MG TOTAL) BY MOUTH DAILY. 30 tablet 1  . levETIRAcetam (KEPPRA) 500 MG tablet TAKE 1 TABLET (500 MG TOTAL) BY MOUTH 2 (TWO) TIMES DAILY. 60 tablet 3  . ondansetron (ZOFRAN) 8 MG tablet TAKE 1 TABLET BY MOUTH TWO TIMES DAILY AS NEEDED FOR NAUSEA AND VOMITING * MAY TAKE 30-60 MINS PRIOR TO TEMODAR ADMINISTRATION (Patient not taking: Reported on 01/14/2021) 30 tablet 1  . pantoprazole (PROTONIX) 40 MG tablet TAKE 1 TABLET (40  MG TOTAL) BY MOUTH DAILY. 30 tablet 3  . polyethylene glycol (MIRALAX / GLYCOLAX) 17 g packet Take 17 g by mouth daily. (Patient not taking: Reported on 01/14/2021)    . Probiotic Product (ALIGN) 4 MG CAPS Take 4 mg by mouth daily.     . rivaroxaban (XARELTO) 20 MG TABS tablet TAKE 1 TABLET (20 MG TOTAL) BY MOUTH DAILY WITH SUPPER. 30 tablet 3  . temozolomide (TEMODAR) 140 MG capsule TAKE 2 CAPSULES (280 MG TOTAL) BY MOUTH DAILY. MAY TAKE ON AN EMPTY STOMACH TO DECREASE NAUSEA & VOMITING. (Patient not taking: No sig  reported) 10 capsule 0   No current facility-administered medications on file prior to visit.    Allergies:  Allergies  Allergen Reactions  . Niacin Hives and Rash   Past Medical History:  Past Medical History:  Diagnosis Date  . Arthritis   . Chronic nonalcoholic liver disease   . Complication of anesthesia   . Continuous leakage of urine   . Depression    situational  . Diverticulosis   . OSA (obstructive sleep apnea) 07/22/2016  . PONV (postoperative nausea and vomiting)   . Steatohepatitis   . Tinnitus   . Varicose veins    Past Surgical History:  Past Surgical History:  Procedure Laterality Date  . APPLICATION OF CRANIAL NAVIGATION N/A 04/19/2020   Procedure: APPLICATION OF CRANIAL NAVIGATION;  Surgeon: Ashok Pall, MD;  Location: [facility];  Service: Neurosurgery;  Laterality: N/A;  . arthroscopic left knee sugery    . bilateral bunionectomy    . BREAST BIOPSY Left   . CHOLECYSTECTOMY    . colonsocopy    . CRANIOTOMY Right 04/19/2020   Procedure: Right Temporal craniotomy for tumor resection;  Surgeon: Ashok Pall, MD;  Location: [facility];  Service: Neurosurgery;  Laterality: Right;  . debridement and removal and lateral release of torn cartlidge in rt knee    . gum grating    . rt knee arthroscopy    . torn cartledge in rt knee     Social History:  Social History   Socioeconomic History  . Marital status: Married    Spouse name: Not on file  . Number of children: Not on file  . Years of education: Not on file  . Highest education level: Not on file  Occupational History  . 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 Smoking status: Never Smoker  . Smokeless tobacco: Never Used  Substance and Sexual Activity  . Alcohol use: No  . Drug use: No  . Sexual activity: Not on file  Other Topics Concern  . 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 Diabetes Mother   . Liver disease Mother   . Diabetes Sister   . Liver disease Sister   . Hypertension Other   . Cancer Other   . Hemochromatosis Other   . Cancer Father        Throat  . Hypertension Brother   . Colon cancer Neg Hx   .  Breast cancer Neg Hx     Review of Systems: Constitutional: Doesn't report fevers, chills or abnormal weight loss Eyes: Doesn't report blurriness of vision Ears, nose, mouth, throat, and face: Doesn't report sore throat Respiratory: Doesn't report cough, dyspnea or wheezes Cardiovascular: Doesn't report palpitation, chest discomfort  Gastrointestinal:  Doesn't report nausea, constipation, diarrhea GU: Doesn't report incontinence Skin: Doesn't report skin rashes Neurological: Per HPI Musculoskeletal: Doesn't report joint pain Behavioral/Psych: Doesn't report anxiety  Physical Exam: Vitals:   01/28/21 1211  BP: (!) 133/96  Pulse: 71  Resp: 16  Temp: 97.6 F (36.4 C)  SpO2: 99%   KPS: 60. General: Alert, cooperative, pleasant, in no acute distress Head: Normal EENT: No conjunctival injection or scleral icterus.  Lungs: Resp effort normal Cardiac: Regular rate Abdomen: Non-distended abdomen Skin: No rashes cyanosis or petechiae. Extremities: No clubbing or edema  Neurologic Exam: Mental Status: Awake, alert, attentive to examiner. Oriented to self and environment. Language is fluent with intact comprehension.  Mild motor and sensory neglect.  Cranial Nerves: Visual acuity is grossly normal. Left hemianopia. Extra-ocular movements intact. No ptosis. Face is symmetric Motor: Tone and bulk are normal. Power is 4/5 in left arm and leg,  with fine motor dystaxia. Reflexes are symmetric, no pathologic reflexes present.  Sensory: Extinguishes left leg and arm. Gait: Deferred   Labs: I have reviewed the data as listed    Component Value Date/Time   NA 137 01/14/2021 1132   K 4.2 01/14/2021 1132   CL 106 01/14/2021 1132   CO2 20 (L) 01/14/2021 1132   GLUCOSE 136 (H) 01/14/2021 1132   BUN 20 01/14/2021 1132   CREATININE 0.68 01/14/2021 1132   CALCIUM 9.5 01/14/2021 1132   PROT 6.9 01/14/2021 1132   ALBUMIN 3.7 01/14/2021 1132   AST 96 (H) 01/14/2021 1132   ALT 340 (HH) 01/14/2021 1132   ALKPHOS 51 01/14/2021 1132   BILITOT 2.5 (H) 01/14/2021 1132   GFRNONAA >60 01/14/2021 1132   GFRAA >60 07/30/2020 1202   Lab Results  Component Value Date   WBC 4.6 01/28/2021   NEUTROABS 3.9 01/28/2021   HGB 15.3 (H) 01/28/2021   HCT 42.4 01/28/2021   MCV 99.1 01/28/2021   PLT 126 (L) 01/28/2021    Assessment/Plan Glioblastoma multiforme of temporal lobe ([place]) [C71.2]   Terry Bell is clinically stable today.  Per our exam she is at prior baseline after subjective decline last week.  We recommended continuing with single agent IV avastin 11m/kg q2 weeks without cytotoxic chemotherapy.  We understand she has chronic hepatitis, liver enzymes are elevated but not increasing over past month.  Avastin should be held for the following:  ANC less than 500  Platelets less than 50,000  LFT or creatinine greater than 2x ULN  If clinical concerns/contraindications develop  We will bridge her with Hydrocortisone 270min AM, 1053mn PM, to ease burden of decadron taper.  Decadron should then be gradually weaned by 1mg81mch week, as prior attempt.   Keppra should be continued at 500mg40m, can increase if events recur again.  May con't PRN use of lasix for dependent edema.  We ask that Terry BARIrn to clinic in 2 weeks with labs prior to avastin infusion.  Will schedule MRI brain for 4 weeks.   All questions  were answered. The patient knows to call the clinic with any problems, questions or concerns. No barriers to learning were detected.  I have spent a total of  30 minutes of face-to-face and non-face-to-face time, excluding clinical staff time, preparing to see patient, ordering tests and/or medications, counseling the patient, and documenting clinical information in the electronic or other health record    Ventura Sellers, MD Medical Director of Neuro-Oncology [facility] [place] at [place] 01/28/21 12:09 PM

## 2021-01-28 NOTE — Patient Instructions (Signed)
[facility] Discharge Instructions for Patients Receiving Chemotherapy  Today you received the following chemotherapy agents: bevacizumab  To help prevent nausea and vomiting after your treatment, we encourage you to take your nausea medication as directed.   If you develop nausea and vomiting that is not controlled by your nausea medication, call the clinic.   BELOW ARE SYMPTOMS THAT SHOULD BE REPORTED IMMEDIATELY:  *FEVER GREATER THAN 100.5 F  *CHILLS WITH OR WITHOUT FEVER  NAUSEA AND VOMITING THAT IS NOT CONTROLLED WITH YOUR NAUSEA MEDICATION  *UNUSUAL SHORTNESS OF BREATH  *UNUSUAL BRUISING OR BLEEDING  TENDERNESS IN MOUTH AND THROAT WITH OR WITHOUT PRESENCE OF ULCERS  *URINARY PROBLEMS  *BOWEL PROBLEMS  UNUSUAL RASH Items with * indicate a potential emergency and should be followed up as soon as possible.  Feel free to call the clinic should you have any questions or concerns. The clinic phone number is [phone].  Please show the CHEMO ALERT CARD at check-in to the Emergency Department and triage nurse.   

## 2021-01-29 DIAGNOSIS — C711 Malignant neoplasm of frontal lobe: Secondary | ICD-10-CM | POA: Diagnosis not present

## 2021-02-05 ENCOUNTER — Other Ambulatory Visit (HOSPITAL_COMMUNITY): Payer: Self-pay

## 2021-02-05 ENCOUNTER — Other Ambulatory Visit: Payer: Self-pay | Admitting: Internal Medicine

## 2021-02-05 ENCOUNTER — Encounter: Payer: Self-pay | Admitting: Internal Medicine

## 2021-02-05 MED ORDER — DEXAMETHASONE 4 MG PO TABS
4.0000 mg | ORAL_TABLET | Freq: Two times a day (BID) | ORAL | 1 refills | Status: AC
  Filled 2021-02-05: qty 60, 30d supply, fill #0
  Filled 2021-04-10: qty 60, 30d supply, fill #1

## 2021-02-05 NOTE — Telephone Encounter (Signed)
Rx refill request

## 2021-02-06 ENCOUNTER — Other Ambulatory Visit (HOSPITAL_COMMUNITY): Payer: Self-pay

## 2021-02-12 ENCOUNTER — Inpatient Hospital Stay: Payer: 59

## 2021-02-12 ENCOUNTER — Other Ambulatory Visit: Payer: Self-pay

## 2021-02-12 ENCOUNTER — Other Ambulatory Visit (HOSPITAL_COMMUNITY): Payer: Self-pay

## 2021-02-12 ENCOUNTER — Inpatient Hospital Stay (HOSPITAL_BASED_OUTPATIENT_CLINIC_OR_DEPARTMENT_OTHER): Payer: 59 | Admitting: Internal Medicine

## 2021-02-12 ENCOUNTER — Other Ambulatory Visit: Payer: Self-pay | Admitting: *Deleted

## 2021-02-12 ENCOUNTER — Telehealth: Payer: Self-pay | Admitting: *Deleted

## 2021-02-12 VITALS — BP 128/85

## 2021-02-12 VITALS — BP 124/104 | HR 69 | Temp 96.7°F | Resp 18 | Ht 66.0 in

## 2021-02-12 DIAGNOSIS — Z5112 Encounter for antineoplastic immunotherapy: Secondary | ICD-10-CM | POA: Diagnosis not present

## 2021-02-12 DIAGNOSIS — Z79899 Other long term (current) drug therapy: Secondary | ICD-10-CM | POA: Diagnosis not present

## 2021-02-12 DIAGNOSIS — R569 Unspecified convulsions: Secondary | ICD-10-CM | POA: Diagnosis not present

## 2021-02-12 DIAGNOSIS — C712 Malignant neoplasm of temporal lobe: Secondary | ICD-10-CM

## 2021-02-12 DIAGNOSIS — Z7952 Long term (current) use of systemic steroids: Secondary | ICD-10-CM | POA: Diagnosis not present

## 2021-02-12 DIAGNOSIS — Z7901 Long term (current) use of anticoagulants: Secondary | ICD-10-CM | POA: Diagnosis not present

## 2021-02-12 LAB — CBC WITH DIFFERENTIAL (CANCER CENTER ONLY)
Abs Immature Granulocytes: 0.14 10*3/uL — ABNORMAL HIGH (ref 0.00–0.07)
Basophils Absolute: 0 10*3/uL (ref 0.0–0.1)
Basophils Relative: 0 %
Eosinophils Absolute: 0 10*3/uL (ref 0.0–0.5)
Eosinophils Relative: 0 %
HCT: 45.2 % (ref 36.0–46.0)
Hemoglobin: 16.1 g/dL — ABNORMAL HIGH (ref 12.0–15.0)
Immature Granulocytes: 2 %
Lymphocytes Relative: 5 %
Lymphs Abs: 0.4 10*3/uL — ABNORMAL LOW (ref 0.7–4.0)
MCH: 35.5 pg — ABNORMAL HIGH (ref 26.0–34.0)
MCHC: 35.6 g/dL (ref 30.0–36.0)
MCV: 99.8 fL (ref 80.0–100.0)
Monocytes Absolute: 0.5 10*3/uL (ref 0.1–1.0)
Monocytes Relative: 7 %
Neutro Abs: 6.5 10*3/uL (ref 1.7–7.7)
Neutrophils Relative %: 86 %
Platelet Count: 91 10*3/uL — ABNORMAL LOW (ref 150–400)
RBC: 4.53 MIL/uL (ref 3.87–5.11)
RDW: 13.2 % (ref 11.5–15.5)
WBC Count: 7.5 10*3/uL (ref 4.0–10.5)
nRBC: 0 % (ref 0.0–0.2)

## 2021-02-12 LAB — CMP (CANCER CENTER ONLY)
ALT: 236 U/L — ABNORMAL HIGH (ref 0–44)
AST: 75 U/L — ABNORMAL HIGH (ref 15–41)
Albumin: 3.8 g/dL (ref 3.5–5.0)
Alkaline Phosphatase: 58 U/L (ref 38–126)
Anion gap: 10 (ref 5–15)
BUN: 20 mg/dL (ref 8–23)
CO2: 20 mmol/L — ABNORMAL LOW (ref 22–32)
Calcium: 9.5 mg/dL (ref 8.9–10.3)
Chloride: 109 mmol/L (ref 98–111)
Creatinine: 0.72 mg/dL (ref 0.44–1.00)
GFR, Estimated: >60 mL/min (ref >60)
Glucose, Bld: 102 mg/dL — ABNORMAL HIGH (ref 70–99)
Potassium: 4.1 mmol/L (ref 3.5–5.1)
Sodium: 139 mmol/L (ref 135–145)
Total Bilirubin: 1.9 mg/dL — ABNORMAL HIGH (ref 0.3–1.2)
Total Protein: 7 g/dL (ref 6.5–8.1)

## 2021-02-12 LAB — TOTAL PROTEIN, URINE DIPSTICK: Protein, ur: 30 mg/dL — AB

## 2021-02-12 MED ORDER — HEPARIN SOD (PORK) LOCK FLUSH 100 UNIT/ML IV SOLN
500.0000 [IU] | Freq: Once | INTRAVENOUS | Status: DC | PRN
  Filled 2021-02-12: qty 5

## 2021-02-12 MED ORDER — SODIUM CHLORIDE 0.9% FLUSH
10.0000 mL | INTRAVENOUS | Status: DC | PRN
  Filled 2021-02-12: qty 10

## 2021-02-12 MED ORDER — SODIUM CHLORIDE 0.9 % IV SOLN
Freq: Once | INTRAVENOUS | Status: AC
Start: 2021-02-12 — End: 2021-02-12
  Filled 2021-02-12: qty 250

## 2021-02-12 MED ORDER — SODIUM CHLORIDE 0.9 % IV SOLN
10.0000 mg/kg | Freq: Once | INTRAVENOUS | Status: AC
  Administered 2021-02-12: 800 mg via INTRAVENOUS
  Filled 2021-02-12: qty 32

## 2021-02-12 MED FILL — Levetiracetam Tab 500 MG: ORAL | 30 days supply | Qty: 60 | Fill #0 | Status: AC

## 2021-02-12 MED FILL — Rivaroxaban Tab 20 MG: ORAL | 30 days supply | Qty: 30 | Fill #0 | Status: AC

## 2021-02-12 NOTE — Telephone Encounter (Signed)
Patients husband called about Home Health PT.  Wanted to go again with [facility] who did PT before.  I called [facility]  And they weren't able to honor the March order because of staffing issues.  She recommended that I place a new order and resubmit.  That was completed and we are pending response.

## 2021-02-12 NOTE — Patient Instructions (Signed)
Bevacizumab injection What is this medicine? BEVACIZUMAB (be va SIZ yoo mab) is a monoclonal antibody. It is used to treat many types of cancer. This medicine may be used for other purposes; ask your health care provider or pharmacist if you have questions. COMMON BRAND NAME(S): Avastin, MVASI, Zirabev What should I tell my health care provider before I take this medicine? They need to know if you have any of these conditions:  diabetes  heart disease  high blood pressure  history of coughing up blood  prior anthracycline chemotherapy (e.g., doxorubicin, daunorubicin, epirubicin)  recent or ongoing radiation therapy  recent or planning to have surgery  stroke  an unusual or allergic reaction to bevacizumab, hamster proteins, mouse proteins, other medicines, foods, dyes, or preservatives  pregnant or trying to get pregnant  breast-feeding How should I use this medicine? This medicine is for infusion into a vein. It is given by a health care professional in a hospital or clinic setting. Talk to your pediatrician regarding the use of this medicine in children. Special care may be needed. Overdosage: If you think you have taken too much of this medicine contact a poison control center or emergency room at once. NOTE: This medicine is only for you. Do not share this medicine with others. What if I miss a dose? It is important not to miss your dose. Call your doctor or health care professional if you are unable to keep an appointment. What may interact with this medicine? Interactions are not expected. This list may not describe all possible interactions. Give your health care provider a list of all the medicines, herbs, non-prescription drugs, or dietary supplements you use. Also tell them if you smoke, drink alcohol, or use illegal drugs. Some items may interact with your medicine. What should I watch for while using this medicine? Your condition will be monitored carefully while  you are receiving this medicine. You will need important blood work and urine testing done while you are taking this medicine. This medicine may increase your risk to bruise or bleed. Call your doctor or health care professional if you notice any unusual bleeding. Before having surgery, talk to your health care provider to make sure it is ok. This drug can increase the risk of poor healing of your surgical site or wound. You will need to stop this drug for 28 days before surgery. After surgery, wait at least 28 days before restarting this drug. Make sure the surgical site or wound is healed enough before restarting this drug. Talk to your health care provider if questions. Do not become pregnant while taking this medicine or for 6 months after stopping it. Women should inform their doctor if they wish to become pregnant or think they might be pregnant. There is a potential for serious side effects to an unborn child. Talk to your health care professional or pharmacist for more information. Do not breast-feed an infant while taking this medicine and for 6 months after the last dose. This medicine has caused ovarian failure in some women. This medicine may interfere with the ability to have a child. You should talk to your doctor or health care professional if you are concerned about your fertility. What side effects may I notice from receiving this medicine? Side effects that you should report to your doctor or health care professional as soon as possible:  allergic reactions like skin rash, itching or hives, swelling of the face, lips, or tongue  chest pain or chest tightness  chills  coughing up blood  high fever  seizures  severe constipation  signs and symptoms of bleeding such as bloody or black, tarry stools; red or dark-brown urine; spitting up blood or brown material that looks like coffee grounds; red spots on the skin; unusual bruising or bleeding from the eye, gums, or nose  signs  and symptoms of a blood clot such as breathing problems; chest pain; severe, sudden headache; pain, swelling, warmth in the leg  signs and symptoms of a stroke like changes in vision; confusion; trouble speaking or understanding; severe headaches; sudden numbness or weakness of the face, arm or leg; trouble walking; dizziness; loss of balance or coordination  stomach pain  sweating  swelling of legs or ankles  vomiting  weight gain Side effects that usually do not require medical attention (report to your doctor or health care professional if they continue or are bothersome):  back pain  changes in taste  decreased appetite  dry skin  nausea  tiredness This list may not describe all possible side effects. Call your doctor for medical advice about side effects. You may report side effects to FDA at 1-800-FDA-1088. Where should I keep my medicine? This drug is given in a hospital or clinic and will not be stored at home. NOTE: This sheet is a summary. It may not cover all possible information. If you have questions about this medicine, talk to your doctor, pharmacist, or health care provider.  2021 Elsevier/Gold Standard (2019-08-10 10:50:46)

## 2021-02-12 NOTE — Progress Notes (Signed)
[facility], [place] [address] [phone]  Interval Evaluation  Date of Service: 02/12/21 Patient Name: Terry Bell Patient MRN: [id number] Patient DOB: [birth year] Provider: Ventura Sellers, MD  Identifying Statement:  Terry Bell is a 65 y.o. female with right temporal glioblastoma   Referring Provider: Martinique, Betty G, MD [place],  [place] [address]  Oncologic History: Oncology History  Glioblastoma multiforme of temporal lobe [facility])  04/19/2020 Surgery   Craniotomy, right temporal resection with Dr. Christella Noa   05/28/2020 - 07/09/2020 Radiation Therapy   IMRT with concurrent Temodar 77m/m2   05/29/2020 -  Chemotherapy    Patient is on Treatment Plan: BRAIN GLIOBLASTOMA CONSOLIDATION TEMOZOLOMIDE DAYS 1-5 Q28 DAYS    Patient is on Antibody Plan: BRAIN GBM BEVACIZUMAB 14D X 6 CYCLES    11/05/2020 -  Chemotherapy    Patient is on Treatment Plan: BRAIN GLIOBLASTOMA CONSOLIDATION TEMOZOLOMIDE DAYS 1-5 Q28 DAYS    Patient is on Antibody Plan: BRAIN GBM BEVACIZUMAB 14D X 6 CYCLES      Biomarkers:  MGMT Methylated.  IDH 1/2 Wild type.  EGFR Not expressed  TERT "Mutated   Interval History:  Terry STORRpresents today for avastin infusion. She and her family report improvement this past week after return to 432mdose of dexamethasone.   On just 70m51mshe was very lethargic and struggled to get out of bed.  Still doing some ambulating with the walker, requiring help for more ADLs.  Overall no issues or concerns with avastin infusion, tolerability.  Working hard with PT as prior.  Dexamethasone 11/20/20: 4mg2m/22/22: 8mg 45m21/22: 4mg  60m (04/26/20) Patient presented last week with several days of progressive new-onset frontal headache.  This may be have been associated with change in taste, but no other neurologic complaints.  CNS imaging demonstrated a large right temporal mass.  She  underwent resection on 04/19/20 with Dr. CabbelChristella Noadischarged on 6/27.  She has no complaints today aside from some residual pain at the surgical site.  Does complain of "hole in vision" on the left side, but otherwise no neurologic deficits.  Taking decadron 4mg tw67m per day.  Medications: Current Outpatient Medications on File Prior to Visit  Medication Sig Dispense Refill  . acetaminophen (TYLENOL) 325 MG tablet Take 650 mg by mouth every 6 (six) hours as needed. Per pt, not to exceed 2G in a given day    . Cholecalciferol (VITAMIN D) 1000 UNITS capsule Take 1,000 Units by mouth 2 (two) times daily.      . ciprofloxacin (CIPRO) 500 MG tablet TAKE 1 TABLET (500 MG TOTAL) BY MOUTH 2 (TWO) TIMES DAILY FOR 5 DAYS. 10 tablet 0  . dexamethasone (DECADRON) 4 MG tablet Take 1 tablet (4 mg total) by mouth 2 (two) times daily with a meal. 60 tablet 1  . furosemide (LASIX) 20 MG tablet TAKE 1 TABLET (20 MG TOTAL) BY MOUTH DAILY. 30 tablet 1  . hydrocortisone (CORTEF) 10 MG tablet Take 2 tablets every morning and 1 tablet every evening 90 tablet 3  . levETIRAcetam (KEPPRA) 500 MG tablet TAKE 1 TABLET (500 MG TOTAL) BY MOUTH 2 (TWO) TIMES DAILY. 60 tablet 3  . ondansetron (ZOFRAN) 8 MG tablet TAKE 1 TABLET BY MOUTH TWO TIMES DAILY AS NEEDED FOR NAUSEA AND VOMITING * MAY TAKE 30-60 MINS PRIOR TO TEMODAR ADMINISTRATION (Patient not taking: Reported on 01/14/2021) 30 tablet 1  .  pantoprazole (PROTONIX) 40 MG tablet TAKE 1 TABLET (40 MG TOTAL) BY MOUTH DAILY. 30 tablet 3  . polyethylene glycol (MIRALAX / GLYCOLAX) 17 g packet Take 17 g by mouth daily. (Patient not taking: Reported on 01/14/2021)    . Probiotic Product (ALIGN) 4 MG CAPS Take 4 mg by mouth daily.     . rivaroxaban (XARELTO) 20 MG TABS tablet TAKE 1 TABLET (20 MG TOTAL) BY MOUTH DAILY WITH SUPPER. 30 tablet 3  . temozolomide (TEMODAR) 140 MG capsule TAKE 2 CAPSULES (280 MG TOTAL) BY MOUTH DAILY. MAY TAKE ON AN EMPTY STOMACH TO DECREASE NAUSEA &  VOMITING. (Patient not taking: No sig reported) 10 capsule 0   No current facility-administered medications on file prior to visit.    Allergies:  Allergies  Allergen Reactions  . Niacin Hives and Rash   Past Medical History:  Past Medical History:  Diagnosis Date  . Arthritis   . Chronic nonalcoholic liver disease   . Complication of anesthesia   . Continuous leakage of urine   . Depression    situational  . Diverticulosis   . OSA (obstructive sleep apnea) 07/22/2016  . PONV (postoperative nausea and vomiting)   . Steatohepatitis   . Tinnitus   . Varicose veins    Past Surgical History:  Past Surgical History:  Procedure Laterality Date  . APPLICATION OF CRANIAL NAVIGATION N/A 04/19/2020   Procedure: APPLICATION OF CRANIAL NAVIGATION;  Surgeon: Ashok Pall, MD;  Location: [facility];  Service: Neurosurgery;  Laterality: N/A;  . arthroscopic left knee sugery    . bilateral bunionectomy    . BREAST BIOPSY Left   . CHOLECYSTECTOMY    . colonsocopy    . CRANIOTOMY Right 04/19/2020   Procedure: Right Temporal craniotomy for tumor resection;  Surgeon: Ashok Pall, MD;  Location: [facility];  Service: Neurosurgery;  Laterality: Right;  . debridement and removal and lateral release of torn cartlidge in rt knee    . gum grating    . rt knee arthroscopy    . torn cartledge in rt knee     Social History:  Social History   Socioeconomic History  . Marital status: Married    Spouse name: Not on file  . Number of children: Not on file  . Years of education: Not on file  . Highest education level: Not on file  Occupational History  . 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 Smoking status: Never Smoker  . Smokeless tobacco: Never Used  Substance and Sexual Activity  . Alcohol use: No  . Drug use: No  . Sexual activity: Not on file  Other Topics Concern  . 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 Diabetes Mother   . Liver disease Mother   . Diabetes Sister   . Liver disease Sister   . Hypertension Other   . Cancer Other   . Hemochromatosis Other   . Cancer Father        Throat  . Hypertension Brother   .  Colon cancer Neg Hx   . Breast cancer Neg Hx     Review of Systems: Constitutional: Doesn't report fevers, chills or abnormal weight loss Eyes: Doesn't report blurriness of vision Ears, nose, mouth, throat, and face: Doesn't report sore throat Respiratory: Doesn't report cough, dyspnea or wheezes Cardiovascular: Doesn't report palpitation, chest discomfort  Gastrointestinal:  Doesn't report nausea, constipation, diarrhea GU: Doesn't report incontinence Skin: Doesn't report skin rashes Neurological: Per HPI Musculoskeletal: Doesn't report joint pain Behavioral/Psych: Doesn't report anxiety  Physical Exam: Vitals:   02/12/21 0900  BP: (!) 124/104  Pulse: 69  Resp: 18  Temp: (!) 96.7 F (35.9 C)  SpO2: 99%   KPS: 60. General: Alert, cooperative, pleasant, in no acute distress Head: Normal EENT: No conjunctival injection or scleral icterus.  Lungs: Resp effort normal Cardiac: Regular rate Abdomen: Non-distended abdomen Skin: No rashes cyanosis or petechiae. Extremities: No clubbing or edema  Neurologic Exam: Mental Status: Awake, alert, attentive to examiner. Oriented to self and environment. Language is fluent with intact comprehension.  Mild motor and sensory neglect.  Cranial Nerves: Visual acuity is grossly normal. Left hemianopia. Extra-ocular movements intact. No ptosis. Face is symmetric Motor: Tone and bulk are  normal. Power is 3/5 in left arm and leg, with fine motor dystaxia. Reflexes are symmetric, no pathologic reflexes present.  Sensory: Extinguishes left leg and arm. Gait: Deferred   Labs: I have reviewed the data as listed    Component Value Date/Time   NA 137 01/28/2021 1147   K 4.2 01/28/2021 1147   CL 106 01/28/2021 1147   CO2 18 (L) 01/28/2021 1147   GLUCOSE 124 (H) 01/28/2021 1147   BUN 12 01/28/2021 1147   CREATININE 0.66 01/28/2021 1147   CALCIUM 9.2 01/28/2021 1147   PROT 7.2 01/28/2021 1147   ALBUMIN 3.8 01/28/2021 1147   AST 78 (H) 01/28/2021 1147   ALT 220 (H) 01/28/2021 1147   ALKPHOS 67 01/28/2021 1147   BILITOT 1.8 (H) 01/28/2021 1147   GFRNONAA >60 01/28/2021 1147   GFRAA >60 07/30/2020 1202   Lab Results  Component Value Date   WBC 7.5 02/12/2021   NEUTROABS 6.5 02/12/2021   HGB 16.1 (H) 02/12/2021   HCT 45.2 02/12/2021   MCV 99.8 02/12/2021   PLT 91 (L) 02/12/2021    Assessment/Plan Glioblastoma multiforme of temporal lobe ([place]) [C71.2]   FENIX RUPPE is clinically stable today.  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  We recommended continuing with single agent IV avastin 51m/kg q2 weeks without cytotoxic chemotherapy.  We understand she has chronic hepatitis, liver enzymes are elevated but not increasing over past month.  Avastin should be held for the following:  ANC less than 500  Platelets less than 50,000  LFT or creatinine greater than 2x ULN  If clinical concerns/contraindications develop  Can increase Hydrocortisone to 273min AM, 2097mn PM as decadron taper bridge.  Decadron should again be tapered to 3mg18m tolerated, and beyond that to 2mg 8mable due to myopathic symptoms.    Keppra should be continued at 500mg 45m can increase if events recur again.  May con't PRN use of lasix for dependent edema.  We ask that Emlyn DAMARIZ PAGANELLIn to clinic in 2 weeks following next brain MRI, or sooner as needed.    All questions were answered. The patient knows to call the clinic with any problems, questions or concerns. No barriers to learning were detected.  I have spent a  total of 30 minutes of face-to-face and non-face-to-face time, excluding clinical staff time, preparing to see patient, ordering tests and/or medications, counseling the patient, and documenting clinical information in the electronic or other health record    Terry Sellers, MD Medical Director of Neuro-Oncology [facility] at [place] 02/12/21 9:30 AM

## 2021-02-13 ENCOUNTER — Other Ambulatory Visit (HOSPITAL_COMMUNITY): Payer: Self-pay

## 2021-02-14 ENCOUNTER — Other Ambulatory Visit (HOSPITAL_COMMUNITY): Payer: Self-pay

## 2021-02-14 DIAGNOSIS — F32A Depression, unspecified: Secondary | ICD-10-CM | POA: Diagnosis not present

## 2021-02-14 DIAGNOSIS — C712 Malignant neoplasm of temporal lobe: Secondary | ICD-10-CM | POA: Diagnosis not present

## 2021-02-14 DIAGNOSIS — M1990 Unspecified osteoarthritis, unspecified site: Secondary | ICD-10-CM | POA: Diagnosis not present

## 2021-02-14 DIAGNOSIS — K5790 Diverticulosis of intestine, part unspecified, without perforation or abscess without bleeding: Secondary | ICD-10-CM | POA: Diagnosis not present

## 2021-02-14 DIAGNOSIS — G4733 Obstructive sleep apnea (adult) (pediatric): Secondary | ICD-10-CM | POA: Diagnosis not present

## 2021-02-14 DIAGNOSIS — Z7901 Long term (current) use of anticoagulants: Secondary | ICD-10-CM | POA: Diagnosis not present

## 2021-02-14 MED ORDER — DEXAMETHASONE 1 MG PO TABS
3.0000 mg | ORAL_TABLET | Freq: Every day | ORAL | 0 refills | Status: AC
  Filled 2021-02-14: qty 90, 30d supply, fill #0

## 2021-02-18 DIAGNOSIS — Z7901 Long term (current) use of anticoagulants: Secondary | ICD-10-CM | POA: Diagnosis not present

## 2021-02-18 DIAGNOSIS — M1990 Unspecified osteoarthritis, unspecified site: Secondary | ICD-10-CM | POA: Diagnosis not present

## 2021-02-18 DIAGNOSIS — C712 Malignant neoplasm of temporal lobe: Secondary | ICD-10-CM | POA: Diagnosis not present

## 2021-02-18 DIAGNOSIS — K5790 Diverticulosis of intestine, part unspecified, without perforation or abscess without bleeding: Secondary | ICD-10-CM | POA: Diagnosis not present

## 2021-02-18 DIAGNOSIS — F32A Depression, unspecified: Secondary | ICD-10-CM | POA: Diagnosis not present

## 2021-02-18 DIAGNOSIS — G4733 Obstructive sleep apnea (adult) (pediatric): Secondary | ICD-10-CM | POA: Diagnosis not present

## 2021-02-20 ENCOUNTER — Other Ambulatory Visit (HOSPITAL_COMMUNITY): Payer: Self-pay

## 2021-02-21 ENCOUNTER — Other Ambulatory Visit (HOSPITAL_COMMUNITY): Payer: Self-pay

## 2021-02-21 ENCOUNTER — Other Ambulatory Visit: Payer: Self-pay

## 2021-02-21 ENCOUNTER — Ambulatory Visit (HOSPITAL_COMMUNITY)
Admission: RE | Admit: 2021-02-21 | Discharge: 2021-02-21 | Disposition: A | Discharge status: Home / Self Care | Payer: 59 | Source: Ambulatory Visit | Attending: Internal Medicine | Admitting: Internal Medicine

## 2021-02-21 DIAGNOSIS — G319 Degenerative disease of nervous system, unspecified: Secondary | ICD-10-CM | POA: Diagnosis not present

## 2021-02-21 DIAGNOSIS — C712 Malignant neoplasm of temporal lobe: Secondary | ICD-10-CM | POA: Diagnosis not present

## 2021-02-21 DIAGNOSIS — G9389 Other specified disorders of brain: Secondary | ICD-10-CM | POA: Diagnosis not present

## 2021-02-21 MED ORDER — GADOBUTROL 1 MMOL/ML IV SOLN
8.0000 mL | Freq: Once | INTRAVENOUS | Status: AC | PRN
  Administered 2021-02-21: 8 mL via INTRAVENOUS

## 2021-02-22 ENCOUNTER — Other Ambulatory Visit: Payer: Self-pay | Admitting: Radiation Therapy

## 2021-02-22 DIAGNOSIS — C712 Malignant neoplasm of temporal lobe: Secondary | ICD-10-CM | POA: Diagnosis not present

## 2021-02-22 DIAGNOSIS — Z7901 Long term (current) use of anticoagulants: Secondary | ICD-10-CM | POA: Diagnosis not present

## 2021-02-22 DIAGNOSIS — K5790 Diverticulosis of intestine, part unspecified, without perforation or abscess without bleeding: Secondary | ICD-10-CM | POA: Diagnosis not present

## 2021-02-22 DIAGNOSIS — G4733 Obstructive sleep apnea (adult) (pediatric): Secondary | ICD-10-CM | POA: Diagnosis not present

## 2021-02-22 DIAGNOSIS — F32A Depression, unspecified: Secondary | ICD-10-CM | POA: Diagnosis not present

## 2021-02-22 DIAGNOSIS — M1990 Unspecified osteoarthritis, unspecified site: Secondary | ICD-10-CM | POA: Diagnosis not present

## 2021-02-25 ENCOUNTER — Inpatient Hospital Stay: Payer: 59 | Attending: Internal Medicine

## 2021-02-25 ENCOUNTER — Encounter: Payer: Self-pay | Admitting: Internal Medicine

## 2021-02-25 DIAGNOSIS — Z7952 Long term (current) use of systemic steroids: Secondary | ICD-10-CM | POA: Insufficient documentation

## 2021-02-25 DIAGNOSIS — G4733 Obstructive sleep apnea (adult) (pediatric): Secondary | ICD-10-CM | POA: Diagnosis not present

## 2021-02-25 DIAGNOSIS — T380X5A Adverse effect of glucocorticoids and synthetic analogues, initial encounter: Secondary | ICD-10-CM | POA: Insufficient documentation

## 2021-02-25 DIAGNOSIS — F32A Depression, unspecified: Secondary | ICD-10-CM | POA: Diagnosis not present

## 2021-02-25 DIAGNOSIS — Z79899 Other long term (current) drug therapy: Secondary | ICD-10-CM | POA: Insufficient documentation

## 2021-02-25 DIAGNOSIS — C712 Malignant neoplasm of temporal lobe: Secondary | ICD-10-CM | POA: Insufficient documentation

## 2021-02-25 DIAGNOSIS — Z5112 Encounter for antineoplastic immunotherapy: Secondary | ICD-10-CM | POA: Insufficient documentation

## 2021-02-25 DIAGNOSIS — Z7901 Long term (current) use of anticoagulants: Secondary | ICD-10-CM | POA: Diagnosis not present

## 2021-02-25 DIAGNOSIS — G720 Drug-induced myopathy: Secondary | ICD-10-CM | POA: Insufficient documentation

## 2021-02-25 DIAGNOSIS — M1990 Unspecified osteoarthritis, unspecified site: Secondary | ICD-10-CM | POA: Diagnosis not present

## 2021-02-25 DIAGNOSIS — K5790 Diverticulosis of intestine, part unspecified, without perforation or abscess without bleeding: Secondary | ICD-10-CM | POA: Diagnosis not present

## 2021-02-26 ENCOUNTER — Inpatient Hospital Stay (HOSPITAL_BASED_OUTPATIENT_CLINIC_OR_DEPARTMENT_OTHER): Payer: 59 | Admitting: Internal Medicine

## 2021-02-26 DIAGNOSIS — R569 Unspecified convulsions: Secondary | ICD-10-CM

## 2021-02-26 DIAGNOSIS — C712 Malignant neoplasm of temporal lobe: Secondary | ICD-10-CM

## 2021-02-26 NOTE — Progress Notes (Signed)
I connected with Terry Bell on 02/26/21 at 12:00 PM EDT by telephone visit and verified that I am speaking with the correct person using two identifiers.  I discussed the limitations, risks, security and privacy concerns of performing an evaluation and management service by telemedicine and the availability of in-person appointments. I also discussed with the patient that there may be a patient responsible charge related to this service. The patient expressed understanding and agreed to proceed.  Other persons participating in the visit and their role in the encounter:  daughter  Patient's location:  Home  Provider's location:  Office  Chief Complaint:  Glioblastoma multiforme of temporal lobe ([place])  Focal seizures ([place])   History of Present Ilness: Terry Bell describes no new or progressive deficits today.  She has cut her decadron down to 52m without issue.  Continues on hydrcortisone bridge. Observations: Language and cognition at baseline  Imaging:  CBlack HawkClinician Interpretation: I have personally reviewed the CNS images as listed.  My interpretation, in the context of the patient's clinical presentation, is stable disease  MR BRAIN W WO CONTRAST  Result Date: 02/21/2021 CLINICAL DATA:  Brain/CNS neoplasm, assess treatment response.  GBM EXAM: MRI HEAD WITHOUT AND WITH CONTRAST TECHNIQUE: Multiplanar, multiecho pulse sequences of the brain and surrounding structures were obtained without and with intravenous contrast. CONTRAST:  810mGADAVIST GADOBUTROL 1 MMOL/ML IV SOLN COMPARISON:  December 29, 2020. FINDINGS: Brain: Slight interval decrease in size of the heterogeneous and irregular lesion centered in the right parietotemporal lobes. For example, a portion lesion measures 2.5 x 2.8 cm (series 16, image 110), previously 2.8 x 3.0 cm when remeasured at a similar location (series 16, image 101 on the prior). On coronal, the lesion measures approximately 1.8 cm in craniocaudal dimension  (series 15, image 10), previously 2.2 cm when remeasured (series 15, image 11 on the prior). No substantial change in the extent of tumor enhancement, including subependymal enhancement. No substantial change in the extent of tumor that restricts diffusion, including intraventricular tumor. Similar extension of soft tissue into the right lateral ventricle, particularly within the temporal horn. Similar areas of cystic change. Similar areas of intrinsic T1 hyperintensity and susceptibility artifact compatible with prior hemorrhage. Surrounding T2/FLAIR hyperintensity and associated mass effect is similar. Similar dilation of the right temporal horn, compatible with ventricular entrapment. Remaining ventricular system is mildly dilated, similar to prior. No evidence of acute infarct. No evidence of new mass occupying acute hemorrhage. Basal cisterns are patent. Similar generalized cerebral atrophy. Vascular: Major arterial flow voids are maintained at the skull base. Skull and upper cervical spine: Right craniotomy. Otherwise, normal marrow signal. Sinuses/Orbits: Mucosal thickening of the inferior maxillary sinuses bilaterally. Unremarkable orbits. Other: No sizable mastoid effusions. IMPRESSION: 1. Interval slight decrease in size/bulk of the right cerebral mass. No substantial change in the extent of enhancement, surrounding T2/FLAIR hyperintensity, and mass effect, as described above. 2. Similar dilation of the right temporal horn, compatible with ventricular entrapment. Remaining ventricular system is mildly dilated, similar to prior. Electronically Signed   By: FrMargaretha SheffieldD   On: 02/21/2021 08:10   Assessment and Plan: Glioblastoma multiforme of temporal lobe ([place] Focal seizures ([place] Clinically and radiographically stable.  Will plan to resume avastin in 2 weeks after 4 week recess, given liver enzymes and thrombocytopenia.  They will cut decadron down to 58m22maily, and stop in 1 week if  tolerated.  Follow Up Instructions: Return for avastin in 2 weeks.  I discussed the assessment and treatment plan with the patient.  The patient was provided an opportunity to ask questions and all were answered.  The patient agreed with the plan and demonstrated understanding of the instructions.    The patient was advised to call back or seek an in-person evaluation if the symptoms worsen or if the condition fails to improve as anticipated.  I provided 5-10 minutes of non-face-to-face time during this enocunter.  Ventura Sellers, MD   I provided 15 minutes of non face-to-face telephone visit time during this encounter, and > 50% was spent counseling as documented under my assessment & plan.

## 2021-02-28 ENCOUNTER — Telehealth: Payer: Self-pay | Admitting: *Deleted

## 2021-02-28 ENCOUNTER — Encounter: Payer: Self-pay | Admitting: Internal Medicine

## 2021-02-28 DIAGNOSIS — Z7901 Long term (current) use of anticoagulants: Secondary | ICD-10-CM | POA: Diagnosis not present

## 2021-02-28 DIAGNOSIS — G4733 Obstructive sleep apnea (adult) (pediatric): Secondary | ICD-10-CM | POA: Diagnosis not present

## 2021-02-28 DIAGNOSIS — F32A Depression, unspecified: Secondary | ICD-10-CM | POA: Diagnosis not present

## 2021-02-28 DIAGNOSIS — C711 Malignant neoplasm of frontal lobe: Secondary | ICD-10-CM | POA: Diagnosis not present

## 2021-02-28 DIAGNOSIS — M1990 Unspecified osteoarthritis, unspecified site: Secondary | ICD-10-CM | POA: Diagnosis not present

## 2021-02-28 DIAGNOSIS — K5790 Diverticulosis of intestine, part unspecified, without perforation or abscess without bleeding: Secondary | ICD-10-CM | POA: Diagnosis not present

## 2021-02-28 DIAGNOSIS — C712 Malignant neoplasm of temporal lobe: Secondary | ICD-10-CM | POA: Diagnosis not present

## 2021-02-28 NOTE — Telephone Encounter (Signed)
Received call from [place] with [facility].  [phone] ext [phone].  She called to provide her information if we should need it.

## 2021-03-04 ENCOUNTER — Other Ambulatory Visit: Payer: Self-pay | Admitting: *Deleted

## 2021-03-04 DIAGNOSIS — G4733 Obstructive sleep apnea (adult) (pediatric): Secondary | ICD-10-CM | POA: Diagnosis not present

## 2021-03-04 DIAGNOSIS — C712 Malignant neoplasm of temporal lobe: Secondary | ICD-10-CM

## 2021-03-04 DIAGNOSIS — K5790 Diverticulosis of intestine, part unspecified, without perforation or abscess without bleeding: Secondary | ICD-10-CM | POA: Diagnosis not present

## 2021-03-04 DIAGNOSIS — Z7901 Long term (current) use of anticoagulants: Secondary | ICD-10-CM | POA: Diagnosis not present

## 2021-03-04 DIAGNOSIS — M1990 Unspecified osteoarthritis, unspecified site: Secondary | ICD-10-CM | POA: Diagnosis not present

## 2021-03-04 DIAGNOSIS — F32A Depression, unspecified: Secondary | ICD-10-CM | POA: Diagnosis not present

## 2021-03-07 DIAGNOSIS — Z7901 Long term (current) use of anticoagulants: Secondary | ICD-10-CM | POA: Diagnosis not present

## 2021-03-07 DIAGNOSIS — K5790 Diverticulosis of intestine, part unspecified, without perforation or abscess without bleeding: Secondary | ICD-10-CM | POA: Diagnosis not present

## 2021-03-07 DIAGNOSIS — C712 Malignant neoplasm of temporal lobe: Secondary | ICD-10-CM | POA: Diagnosis not present

## 2021-03-07 DIAGNOSIS — G4733 Obstructive sleep apnea (adult) (pediatric): Secondary | ICD-10-CM | POA: Diagnosis not present

## 2021-03-07 DIAGNOSIS — M1990 Unspecified osteoarthritis, unspecified site: Secondary | ICD-10-CM | POA: Diagnosis not present

## 2021-03-07 DIAGNOSIS — F32A Depression, unspecified: Secondary | ICD-10-CM | POA: Diagnosis not present

## 2021-03-08 ENCOUNTER — Other Ambulatory Visit: Payer: Self-pay

## 2021-03-08 ENCOUNTER — Inpatient Hospital Stay: Payer: 59

## 2021-03-08 DIAGNOSIS — C712 Malignant neoplasm of temporal lobe: Secondary | ICD-10-CM

## 2021-03-12 ENCOUNTER — Other Ambulatory Visit (HOSPITAL_COMMUNITY): Payer: Self-pay

## 2021-03-12 ENCOUNTER — Inpatient Hospital Stay: Payer: 59

## 2021-03-12 ENCOUNTER — Other Ambulatory Visit: Payer: Self-pay

## 2021-03-12 ENCOUNTER — Inpatient Hospital Stay (HOSPITAL_BASED_OUTPATIENT_CLINIC_OR_DEPARTMENT_OTHER): Payer: 59 | Admitting: Internal Medicine

## 2021-03-12 VITALS — BP 132/98 | HR 105 | Temp 97.5°F | Resp 16 | Ht 66.0 in | Wt 180.0 lb

## 2021-03-12 DIAGNOSIS — Z5112 Encounter for antineoplastic immunotherapy: Secondary | ICD-10-CM | POA: Diagnosis not present

## 2021-03-12 DIAGNOSIS — Z7952 Long term (current) use of systemic steroids: Secondary | ICD-10-CM | POA: Diagnosis not present

## 2021-03-12 DIAGNOSIS — C712 Malignant neoplasm of temporal lobe: Secondary | ICD-10-CM

## 2021-03-12 DIAGNOSIS — R569 Unspecified convulsions: Secondary | ICD-10-CM | POA: Diagnosis not present

## 2021-03-12 DIAGNOSIS — T380X5A Adverse effect of glucocorticoids and synthetic analogues, initial encounter: Secondary | ICD-10-CM | POA: Diagnosis not present

## 2021-03-12 DIAGNOSIS — G720 Drug-induced myopathy: Secondary | ICD-10-CM | POA: Diagnosis not present

## 2021-03-12 DIAGNOSIS — Z79899 Other long term (current) drug therapy: Secondary | ICD-10-CM | POA: Diagnosis not present

## 2021-03-12 LAB — CBC WITH DIFFERENTIAL (CANCER CENTER ONLY)
Abs Immature Granulocytes: 0.02 10*3/uL (ref 0.00–0.07)
Basophils Absolute: 0 10*3/uL (ref 0.0–0.1)
Basophils Relative: 0 %
Eosinophils Absolute: 0 10*3/uL (ref 0.0–0.5)
Eosinophils Relative: 0 %
HCT: 45.5 % (ref 36.0–46.0)
Hemoglobin: 15.6 g/dL — ABNORMAL HIGH (ref 12.0–15.0)
Immature Granulocytes: 0 %
Lymphocytes Relative: 14 %
Lymphs Abs: 0.7 10*3/uL (ref 0.7–4.0)
MCH: 34.8 pg — ABNORMAL HIGH (ref 26.0–34.0)
MCHC: 34.3 g/dL (ref 30.0–36.0)
MCV: 101.6 fL — ABNORMAL HIGH (ref 80.0–100.0)
Monocytes Absolute: 0.5 10*3/uL (ref 0.1–1.0)
Monocytes Relative: 9 %
Neutro Abs: 4 10*3/uL (ref 1.7–7.7)
Neutrophils Relative %: 77 %
Platelet Count: 137 10*3/uL — ABNORMAL LOW (ref 150–400)
RBC: 4.48 MIL/uL (ref 3.87–5.11)
RDW: 13.1 % (ref 11.5–15.5)
WBC Count: 5.3 10*3/uL (ref 4.0–10.5)
nRBC: 0 % (ref 0.0–0.2)

## 2021-03-12 LAB — CMP (CANCER CENTER ONLY)
ALT: 153 U/L — ABNORMAL HIGH (ref 0–44)
AST: 70 U/L — ABNORMAL HIGH (ref 15–41)
Albumin: 3.3 g/dL — ABNORMAL LOW (ref 3.5–5.0)
Alkaline Phosphatase: 89 U/L (ref 38–126)
Anion gap: 10 (ref 5–15)
BUN: 13 mg/dL (ref 8–23)
CO2: 23 mmol/L (ref 22–32)
Calcium: 9.7 mg/dL (ref 8.9–10.3)
Chloride: 109 mmol/L (ref 98–111)
Creatinine: 0.67 mg/dL (ref 0.44–1.00)
GFR, Estimated: >60 mL/min (ref >60)
Glucose, Bld: 135 mg/dL — ABNORMAL HIGH (ref 70–99)
Potassium: 4.2 mmol/L (ref 3.5–5.1)
Sodium: 142 mmol/L (ref 135–145)
Total Bilirubin: 2.2 mg/dL — ABNORMAL HIGH (ref 0.3–1.2)
Total Protein: 7 g/dL (ref 6.5–8.1)

## 2021-03-12 LAB — TOTAL PROTEIN, URINE DIPSTICK: Protein, ur: 30 mg/dL — AB

## 2021-03-12 MED ORDER — METHYLPHENIDATE HCL 5 MG PO TABS
5.0000 mg | ORAL_TABLET | Freq: Two times a day (BID) | ORAL | 0 refills | Status: DC
  Filled 2021-03-12: qty 60, 30d supply, fill #0

## 2021-03-12 MED ORDER — SODIUM CHLORIDE 0.9 % IV SOLN
Freq: Once | INTRAVENOUS | Status: AC
Start: 2021-03-12 — End: 2021-03-12
  Filled 2021-03-12: qty 250

## 2021-03-12 MED ORDER — SODIUM CHLORIDE 0.9 % IV SOLN
10.0000 mg/kg | Freq: Once | INTRAVENOUS | Status: AC
  Administered 2021-03-12: 800 mg via INTRAVENOUS
  Filled 2021-03-12: qty 32

## 2021-03-12 NOTE — Progress Notes (Signed)
[facility] [place], [place] [address] [phone]  Interval Evaluation  Date of Service: 03/12/21 Patient Name: Terry Bell Patient MRN: [id number] Patient DOB: 07-26-1956 Provider: Ventura Sellers, MD  Identifying Statement:  Terry Bell is a 65 y.o. female with right temporal glioblastoma   Referring Provider: Martinique, Betty G, MD [place],  [place] [address]  Oncologic History: Oncology History  Glioblastoma multiforme of temporal lobe [facility])  04/19/2020 Surgery   Craniotomy, right temporal resection with Dr. Christella Noa   05/28/2020 - 07/09/2020 Radiation Therapy   IMRT with concurrent Temodar 12m/m2   05/29/2020 -  Chemotherapy    Patient is on Treatment Plan: BRAIN GLIOBLASTOMA CONSOLIDATION TEMOZOLOMIDE DAYS 1-5 Q28 DAYS    Patient is on Antibody Plan: BRAIN GBM BEVACIZUMAB 14D X 6 CYCLES    11/05/2020 -  Chemotherapy    Patient is on Treatment Plan: BRAIN GLIOBLASTOMA CONSOLIDATION TEMOZOLOMIDE DAYS 1-5 Q28 DAYS    Patient is on Antibody Plan: BRAIN GBM BEVACIZUMAB 14D X 6 CYCLES      Biomarkers:  MGMT Methylated.  IDH 1/2 Wild type.  EGFR Not expressed  TERT "Mutated   Interval History:  Terry HACKLEMANpresents today for avastin infusion following recent MRI brain.  She and her husband describe ongoing gradual worsening of left sided weakness, although no new symptoms.   Fatigue has been more present than previously, she is now sleeping more, napping twice per day.  Only working with PT once per week currently.  She has formally discontinued decadron, and is on hydrocortisone twice per day.  Overall no issues or concerns with avastin infusion, tolerability.    Dexamethasone 11/20/20: 470m02/22/22: 57m25m3/21/22: 4mg3m/17/22: -  H+P (04/26/20) Patient presented last week with several days of progressive new-onset frontal headache.  This may be have been associated with change in taste, but  no other neurologic complaints.  CNS imaging demonstrated a large right temporal mass.  She underwent resection on 04/19/20 with Dr. CabbChristella Noas discharged on 6/27.  She has no complaints today aside from some residual pain at the surgical site.  Does complain of "hole in vision" on the left side, but otherwise no neurologic deficits.  Taking decadron 4mg 4mce per day.  Medications: Current Outpatient Medications on File Prior to Visit  Medication Sig Dispense Refill  . acetaminophen (TYLENOL) 325 MG tablet Take 650 mg by mouth every 6 (six) hours as needed. Per pt, not to exceed 2G in a given day    . Cholecalciferol (VITAMIN D) 1000 UNITS capsule Take 1,000 Units by mouth 2 (two) times daily.      . ciprofloxacin (CIPRO) 500 MG tablet TAKE 1 TABLET (500 MG TOTAL) BY MOUTH 2 (TWO) TIMES DAILY FOR 5 DAYS. (Patient not taking: Reported on 02/12/2021) 10 tablet 0  . dexamethasone (DECADRON) 1 MG tablet Take 3 tablets (3 mg total) by mouth daily. 90 tablet 0  . dexamethasone (DECADRON) 4 MG tablet Take 1 tablet (4 mg total) by mouth 2 (two) times daily with a meal. 60 tablet 1  . furosemide (LASIX) 20 MG tablet TAKE 1 TABLET (20 MG TOTAL) BY MOUTH DAILY. 30 tablet 1  . hydrocortisone (CORTEF) 10 MG tablet Take 2 tablets every morning and 1 tablet every evening 90 tablet 3  . levETIRAcetam (KEPPRA) 500 MG tablet Take 1 tablet (500 mg total) by mouth 2 (two) times daily. 60 tablet 3  .  ondansetron (ZOFRAN) 8 MG tablet TAKE 1 TABLET BY MOUTH TWO TIMES DAILY AS NEEDED FOR NAUSEA AND VOMITING * MAY TAKE 30-60 MINS PRIOR TO TEMODAR ADMINISTRATION (Patient not taking: Reported on 01/14/2021) 30 tablet 1  . pantoprazole (PROTONIX) 40 MG tablet TAKE 1 TABLET (40 MG TOTAL) BY MOUTH DAILY. 30 tablet 3  . polyethylene glycol (MIRALAX / GLYCOLAX) 17 g packet Take 17 g by mouth daily. (Patient not taking: Reported on 01/14/2021)    . Probiotic Product (ALIGN) 4 MG CAPS Take 4 mg by mouth daily.     . rivaroxaban  (XARELTO) 20 MG TABS tablet Take 1 tablet (20 mg total) by mouth daily with supper. 30 tablet 3  . temozolomide (TEMODAR) 140 MG capsule TAKE 2 CAPSULES (280 MG TOTAL) BY MOUTH DAILY. MAY TAKE ON AN EMPTY STOMACH TO DECREASE NAUSEA & VOMITING. (Patient not taking: No sig reported) 10 capsule 0   No current facility-administered medications on file prior to visit.    Allergies:  Allergies  Allergen Reactions  . Niacin Hives and Rash   Past Medical History:  Past Medical History:  Diagnosis Date  . Arthritis   . Chronic nonalcoholic liver disease   . Complication of anesthesia   . Continuous leakage of urine   . Depression    situational  . Diverticulosis   . OSA (obstructive sleep apnea) 07/22/2016  . PONV (postoperative nausea and vomiting)   . Steatohepatitis   . Tinnitus   . Varicose veins    Past Surgical History:  Past Surgical History:  Procedure Laterality Date  . APPLICATION OF CRANIAL NAVIGATION N/A 04/19/2020   Procedure: APPLICATION OF CRANIAL NAVIGATION;  Surgeon: Ashok Pall, MD;  Location: [facility];  Service: Neurosurgery;  Laterality: N/A;  . arthroscopic left knee sugery    . bilateral bunionectomy    . BREAST BIOPSY Left   . CHOLECYSTECTOMY    . colonsocopy    . CRANIOTOMY Right 04/19/2020   Procedure: Right Temporal craniotomy for tumor resection;  Surgeon: Ashok Pall, MD;  Location: [facility];  Service: Neurosurgery;  Laterality: Right;  . debridement and removal and lateral release of torn cartlidge in rt knee    . gum grating    . rt knee arthroscopy    . torn cartledge in rt knee     Social History:  Social History   Socioeconomic History  . Marital status: Married    Spouse name: Not on file  . Number of children: Not on file  . Years of education: Not on file  . Highest education level: Not on file  Occupational History  . 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 Smoking status: Never Smoker  . Smokeless tobacco: Never Used  Substance and  Sexual Activity  . Alcohol use: No  . Drug use: No  . Sexual activity: Not on file  Other Topics Concern  . 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 Diabetes Mother   . Liver disease Mother   . Diabetes  Sister   . Liver disease Sister   . Hypertension Other   . Cancer Other   . Hemochromatosis Other   . Cancer Father        Throat  . Hypertension Brother   . Colon cancer Neg Hx   . Breast cancer Neg Hx     Review of Systems: Constitutional: Doesn't report fevers, chills or abnormal weight loss Eyes: Doesn't report blurriness of vision Ears, nose, mouth, throat, and face: Doesn't report sore throat Respiratory: Doesn't report cough, dyspnea or wheezes Cardiovascular: Doesn't report palpitation, chest discomfort  Gastrointestinal:  Doesn't report nausea, constipation, diarrhea GU: Doesn't report incontinence Skin: Doesn't report skin rashes Neurological: Per HPI Musculoskeletal: Doesn't report joint pain Behavioral/Psych: Doesn't report anxiety  Physical Exam: Vitals:   03/12/21 1152  BP: (!) 132/98  Pulse: (!) 105  Resp: 16  Temp: (!) 97.5 F (36.4 C)  SpO2: 98%   KPS: 60. General: Alert, cooperative, pleasant, in no acute distress Head: Normal EENT: No conjunctival injection or scleral icterus.  Lungs: Resp effort normal Cardiac: Regular rate Abdomen: Non-distended abdomen Skin: No rashes cyanosis or petechiae. Extremities: No clubbing or edema  Neurologic Exam: Mental Status: Awake, alert, attentive to examiner. Oriented to self and  environment. Language is fluent with intact comprehension.  Mild motor and sensory neglect.  Cranial Nerves: Visual acuity is grossly normal. Left hemianopia. Extra-ocular movements intact. No ptosis. Face is symmetric Motor: Tone and bulk are normal. Power is 3/5 in left arm and leg, with fine motor dystaxia. Reflexes are symmetric, no pathologic reflexes present.  Sensory: Extinguishes left leg and arm. Gait: Deferred   Labs: I have reviewed the data as listed    Component Value Date/Time   NA 139 02/12/2021 0910   K 4.1 02/12/2021 0910   CL 109 02/12/2021 0910   CO2 20 (L) 02/12/2021 0910   GLUCOSE 102 (H) 02/12/2021 0910   BUN 20 02/12/2021 0910   CREATININE 0.72 02/12/2021 0910   CALCIUM 9.5 02/12/2021 0910   PROT 7.0 02/12/2021 0910   ALBUMIN 3.8 02/12/2021 0910   AST 75 (H) 02/12/2021 0910   ALT 236 (H) 02/12/2021 0910   ALKPHOS 58 02/12/2021 0910   BILITOT 1.9 (H) 02/12/2021 0910   GFRNONAA >60 02/12/2021 0910   GFRAA >60 07/30/2020 1202   Lab Results  Component Value Date   WBC 5.3 03/12/2021   NEUTROABS 4.0 03/12/2021   HGB 15.6 (H) 03/12/2021   HCT 45.5 03/12/2021   MCV 101.6 (H) 03/12/2021   PLT 137 (L) 03/12/2021   Imaging:  [facility] Clinician Interpretation: I have personally reviewed the CNS images as listed.  My interpretation, in the context of the patient's clinical presentation, is stable disease  MR BRAIN W WO CONTRAST  Result Date: 02/21/2021 CLINICAL DATA:  Brain/CNS neoplasm, assess treatment response.  GBM EXAM: MRI HEAD WITHOUT AND WITH CONTRAST TECHNIQUE: Multiplanar, multiecho pulse sequences of the brain and surrounding structures were obtained without and with intravenous contrast. CONTRAST:  35m GADAVIST GADOBUTROL 1 MMOL/ML IV SOLN COMPARISON:  December 29, 2020. FINDINGS: Brain: Slight interval decrease in size of the heterogeneous and irregular lesion centered in the right parietotemporal lobes. For example, a portion lesion measures 2.5 x 2.8 cm  (series 16, image 110), previously 2.8 x 3.0 cm when remeasured at a similar location (series 16, image 101 on the prior). On coronal, the lesion measures approximately 1.8 cm in craniocaudal dimension (series 15, image 10), previously 2.2 cm when remeasured (series 15,  image 11 on the prior). No substantial change in the extent of tumor enhancement, including subependymal enhancement. No substantial change in the extent of tumor that restricts diffusion, including intraventricular tumor. Similar extension of soft tissue into the right lateral ventricle, particularly within the temporal horn. Similar areas of cystic change. Similar areas of intrinsic T1 hyperintensity and susceptibility artifact compatible with prior hemorrhage. Surrounding T2/FLAIR hyperintensity and associated mass effect is similar. Similar dilation of the right temporal horn, compatible with ventricular entrapment. Remaining ventricular system is mildly dilated, similar to prior. No evidence of acute infarct. No evidence of new mass occupying acute hemorrhage. Basal cisterns are patent. Similar generalized cerebral atrophy. Vascular: Major arterial flow voids are maintained at the skull base. Skull and upper cervical spine: Right craniotomy. Otherwise, normal marrow signal. Sinuses/Orbits: Mucosal thickening of the inferior maxillary sinuses bilaterally. Unremarkable orbits. Other: No sizable mastoid effusions. IMPRESSION: 1. Interval slight decrease in size/bulk of the right cerebral mass. No substantial change in the extent of enhancement, surrounding T2/FLAIR hyperintensity, and mass effect, as described above. 2. Similar dilation of the right temporal horn, compatible with ventricular entrapment. Remaining ventricular system is mildly dilated, similar to prior. Electronically Signed   By: Margaretha Sheffield MD   On: 02/21/2021 08:10   Assessment/Plan Glioblastoma multiforme of temporal lobe ([facility]) [C71.2]   Alonna Buckler presents today  with stable focal deficits (objectively), and increase in subjective fatigue.  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  We recommended continuing with single agent IV avastin 74m/kg, but changing infusion to q3 weeks because of difficulties with transport.  We understand she has chronic hepatitis, liver enzymes are elevated but not increasing over past month.  Avastin should be held for the following:  ANC less than 500  Platelets less than 50,000  LFT or creatinine greater than 2x ULN  If clinical concerns/contraindications develop  Will continue to taper hydrocrotisone by 137meach week as scheduled, currently on 1038m0mg.  For fatigue, lethargy, easy exhaustion, recommend trial of Ritalin 5mg36mD.  May uptitrate dosage from there if beneficial.  Keppra should be continued at 500mg62m, can increase if events recur again.  We ask that DonnaKYLAR SPEELMANrn to clinic in 3 weeks following next brain MRI, or sooner as needed.   All questions were answered. The patient knows to call the clinic with any problems, questions or concerns. No barriers to learning were detected.  I have spent a total of 40 minutes of face-to-face and non-face-to-face time, excluding clinical staff time, preparing to see patient, ordering tests and/or medications, counseling the patient, and documenting clinical information in the electronic or other health record    ZachaVentura SellersMedical Director of Neuro-Oncology [facility]7/22 11:53 AM

## 2021-03-12 NOTE — Patient Instructions (Signed)
[facility] ONCOLOG[facility]  Discharge Instructions: Thank you for choosing [facility] to provide your oncology and hematology care.   If you have a lab appointment with the [facility], please go directly to the [facility] and check in at the registration area.   Wear comfortable clothing and clothing appropriate for easy access to any Portacath or PICC line.   We strive to give you quality time with your provider. You may need to reschedule your appointment if you arrive late (15 or more minutes).  Arriving late affects you and other patients whose appointments are after yours.  Also, if you miss three or more appointments without notifying the office, you may be dismissed from the clinic at the provider's discretion.      For prescription refill requests, have your pharmacy contact our office and allow 72 hours for refills to be completed.    Today you received the following chemotherapy and/or immunotherapy agents: Bevacizumab     To help prevent nausea and vomiting after your treatment, we encourage you to take your nausea medication as directed.  BELOW ARE SYMPTOMS THAT SHOULD BE REPORTED IMMEDIATELY: . *FEVER GREATER THAN 100.4 F (38 C) OR HIGHER . *CHILLS OR SWEATING . *NAUSEA AND VOMITING THAT IS NOT CONTROLLED WITH YOUR NAUSEA MEDICATION . *UNUSUAL SHORTNESS OF BREATH . *UNUSUAL BRUISING OR BLEEDING . *URINARY PROBLEMS (pain or burning when urinating, or frequent urination) . *BOWEL PROBLEMS (unusual diarrhea, constipation, pain near the anus) . TENDERNESS IN MOUTH AND THROAT WITH OR WITHOUT PRESENCE OF ULCERS (sore throat, sores in mouth, or a toothache) . UNUSUAL RASH, SWELLING OR PAIN  . UNUSUAL VAGINAL DISCHARGE OR ITCHING   Items with * indicate a potential emergency and should be followed up as soon as possible or go to the Emergency Department if any problems should occur.  Please show the CHEMOTHERAPY ALERT CARD or IMMUNOTHERAPY ALERT  CARD at check-in to the Emergency Department and triage nurse.  Should you have questions after your visit or need to cancel or reschedule your appointment, please contact [facility]  Dept: [phone]  and follow the prompts.  Office hours are 8:00 a.m. to 4:30 p.m. Monday - Friday. Please note that voicemails left after 4:00 p.m. may not be returned until the following business day.  We are closed weekends and major holidays. You have access to a nurse at all times for urgent questions. Please call the main number to the clinic Dept: [phone] and follow the prompts.   For any non-urgent questions, you may also contact your provider using MyChart. We now offer e-Visits for anyone 65 and older to request care online for non-urgent symptoms. For details visit [email or web address].   Also download the MyChart app! Go to the app store, search "MyChart", open the app, select [facility], and log in with your MyChart username and password.  Due to Covid, a mask is required upon entering the hospital/clinic. If you do not have a mask, one will be given to you upon arrival. For doctor visits, patients may have 1 support person aged 65 or older with them. For treatment visits, patients cannot have anyone with them due to current Covid guidelines and our immunocompromised population.

## 2021-03-12 NOTE — Progress Notes (Signed)
Okay to treat with elevated HR per Dr. Mickeal Skinner

## 2021-03-14 DIAGNOSIS — Z7901 Long term (current) use of anticoagulants: Secondary | ICD-10-CM | POA: Diagnosis not present

## 2021-03-14 DIAGNOSIS — F32A Depression, unspecified: Secondary | ICD-10-CM | POA: Diagnosis not present

## 2021-03-14 DIAGNOSIS — G4733 Obstructive sleep apnea (adult) (pediatric): Secondary | ICD-10-CM | POA: Diagnosis not present

## 2021-03-14 DIAGNOSIS — K5790 Diverticulosis of intestine, part unspecified, without perforation or abscess without bleeding: Secondary | ICD-10-CM | POA: Diagnosis not present

## 2021-03-14 DIAGNOSIS — M1990 Unspecified osteoarthritis, unspecified site: Secondary | ICD-10-CM | POA: Diagnosis not present

## 2021-03-14 DIAGNOSIS — C712 Malignant neoplasm of temporal lobe: Secondary | ICD-10-CM | POA: Diagnosis not present

## 2021-03-19 ENCOUNTER — Encounter: Payer: Self-pay | Admitting: Internal Medicine

## 2021-03-20 ENCOUNTER — Encounter: Payer: Self-pay | Admitting: Internal Medicine

## 2021-03-20 MED FILL — Rivaroxaban Tab 20 MG: ORAL | 30 days supply | Qty: 30 | Fill #1 | Status: AC

## 2021-03-20 MED FILL — Levetiracetam Tab 500 MG: ORAL | 30 days supply | Qty: 60 | Fill #1 | Status: AC

## 2021-03-21 ENCOUNTER — Other Ambulatory Visit (HOSPITAL_COMMUNITY): Payer: Self-pay

## 2021-03-21 DIAGNOSIS — C712 Malignant neoplasm of temporal lobe: Secondary | ICD-10-CM | POA: Diagnosis not present

## 2021-03-21 DIAGNOSIS — K5790 Diverticulosis of intestine, part unspecified, without perforation or abscess without bleeding: Secondary | ICD-10-CM | POA: Diagnosis not present

## 2021-03-21 DIAGNOSIS — G4733 Obstructive sleep apnea (adult) (pediatric): Secondary | ICD-10-CM | POA: Diagnosis not present

## 2021-03-21 DIAGNOSIS — F32A Depression, unspecified: Secondary | ICD-10-CM | POA: Diagnosis not present

## 2021-03-21 DIAGNOSIS — Z7901 Long term (current) use of anticoagulants: Secondary | ICD-10-CM | POA: Diagnosis not present

## 2021-03-21 DIAGNOSIS — M1990 Unspecified osteoarthritis, unspecified site: Secondary | ICD-10-CM | POA: Diagnosis not present

## 2021-03-26 DIAGNOSIS — K5790 Diverticulosis of intestine, part unspecified, without perforation or abscess without bleeding: Secondary | ICD-10-CM | POA: Diagnosis not present

## 2021-03-26 DIAGNOSIS — M1990 Unspecified osteoarthritis, unspecified site: Secondary | ICD-10-CM | POA: Diagnosis not present

## 2021-03-26 DIAGNOSIS — G4733 Obstructive sleep apnea (adult) (pediatric): Secondary | ICD-10-CM | POA: Diagnosis not present

## 2021-03-26 DIAGNOSIS — Z7901 Long term (current) use of anticoagulants: Secondary | ICD-10-CM | POA: Diagnosis not present

## 2021-03-26 DIAGNOSIS — C712 Malignant neoplasm of temporal lobe: Secondary | ICD-10-CM | POA: Diagnosis not present

## 2021-03-26 DIAGNOSIS — F32A Depression, unspecified: Secondary | ICD-10-CM | POA: Diagnosis not present

## 2021-04-01 ENCOUNTER — Other Ambulatory Visit: Payer: Self-pay

## 2021-04-01 ENCOUNTER — Inpatient Hospital Stay: Payer: 59

## 2021-04-01 ENCOUNTER — Inpatient Hospital Stay: Payer: 59 | Attending: Internal Medicine

## 2021-04-01 ENCOUNTER — Inpatient Hospital Stay (HOSPITAL_BASED_OUTPATIENT_CLINIC_OR_DEPARTMENT_OTHER): Payer: 59 | Admitting: Internal Medicine

## 2021-04-01 ENCOUNTER — Other Ambulatory Visit: Payer: Self-pay | Admitting: Internal Medicine

## 2021-04-01 VITALS — BP 113/90 | HR 71 | Temp 97.7°F | Resp 17

## 2021-04-01 DIAGNOSIS — G4733 Obstructive sleep apnea (adult) (pediatric): Secondary | ICD-10-CM | POA: Diagnosis not present

## 2021-04-01 DIAGNOSIS — C712 Malignant neoplasm of temporal lobe: Secondary | ICD-10-CM | POA: Insufficient documentation

## 2021-04-01 DIAGNOSIS — Z923 Personal history of irradiation: Secondary | ICD-10-CM | POA: Diagnosis not present

## 2021-04-01 DIAGNOSIS — Z9221 Personal history of antineoplastic chemotherapy: Secondary | ICD-10-CM | POA: Insufficient documentation

## 2021-04-01 DIAGNOSIS — Z7952 Long term (current) use of systemic steroids: Secondary | ICD-10-CM | POA: Diagnosis not present

## 2021-04-01 DIAGNOSIS — Z5112 Encounter for antineoplastic immunotherapy: Secondary | ICD-10-CM | POA: Insufficient documentation

## 2021-04-01 DIAGNOSIS — K7581 Nonalcoholic steatohepatitis (NASH): Secondary | ICD-10-CM | POA: Diagnosis not present

## 2021-04-01 DIAGNOSIS — Z7901 Long term (current) use of anticoagulants: Secondary | ICD-10-CM | POA: Insufficient documentation

## 2021-04-01 DIAGNOSIS — Z79899 Other long term (current) drug therapy: Secondary | ICD-10-CM | POA: Insufficient documentation

## 2021-04-01 LAB — TOTAL PROTEIN, URINE DIPSTICK: Protein, ur: NEGATIVE mg/dL

## 2021-04-01 LAB — CBC WITH DIFFERENTIAL (CANCER CENTER ONLY)
Abs Immature Granulocytes: 0.15 10*3/uL — ABNORMAL HIGH (ref 0.00–0.07)
Basophils Absolute: 0 10*3/uL (ref 0.0–0.1)
Basophils Relative: 0 %
Eosinophils Absolute: 0 10*3/uL (ref 0.0–0.5)
Eosinophils Relative: 0 %
HCT: 46.5 % — ABNORMAL HIGH (ref 36.0–46.0)
Hemoglobin: 16.2 g/dL — ABNORMAL HIGH (ref 12.0–15.0)
Immature Granulocytes: 2 %
Lymphocytes Relative: 5 %
Lymphs Abs: 0.5 10*3/uL — ABNORMAL LOW (ref 0.7–4.0)
MCH: 34.7 pg — ABNORMAL HIGH (ref 26.0–34.0)
MCHC: 34.8 g/dL (ref 30.0–36.0)
MCV: 99.6 fL (ref 80.0–100.0)
Monocytes Absolute: 0.3 10*3/uL (ref 0.1–1.0)
Monocytes Relative: 4 %
Neutro Abs: 7.7 10*3/uL (ref 1.7–7.7)
Neutrophils Relative %: 89 %
Platelet Count: 91 10*3/uL — ABNORMAL LOW (ref 150–400)
RBC: 4.67 MIL/uL (ref 3.87–5.11)
RDW: 12.9 % (ref 11.5–15.5)
WBC Count: 8.7 10*3/uL (ref 4.0–10.5)
nRBC: 0 % (ref 0.0–0.2)

## 2021-04-01 LAB — CMP (CANCER CENTER ONLY)
ALT: 322 U/L (ref 0–44)
AST: 141 U/L — ABNORMAL HIGH (ref 15–41)
Albumin: 3.3 g/dL — ABNORMAL LOW (ref 3.5–5.0)
Alkaline Phosphatase: 90 U/L (ref 38–126)
Anion gap: 10 (ref 5–15)
BUN: 14 mg/dL (ref 8–23)
CO2: 21 mmol/L — ABNORMAL LOW (ref 22–32)
Calcium: 9.6 mg/dL (ref 8.9–10.3)
Chloride: 107 mmol/L (ref 98–111)
Creatinine: 0.63 mg/dL (ref 0.44–1.00)
GFR, Estimated: >60 mL/min (ref >60)
Glucose, Bld: 148 mg/dL — ABNORMAL HIGH (ref 70–99)
Potassium: 4.3 mmol/L (ref 3.5–5.1)
Sodium: 138 mmol/L (ref 135–145)
Total Bilirubin: 1.6 mg/dL — ABNORMAL HIGH (ref 0.3–1.2)
Total Protein: 6.6 g/dL (ref 6.5–8.1)

## 2021-04-01 MED ORDER — SODIUM CHLORIDE 0.9 % IV SOLN
Freq: Once | INTRAVENOUS | Status: AC
Start: 2021-04-01 — End: 2021-04-01
  Filled 2021-04-01: qty 250

## 2021-04-01 MED ORDER — SODIUM CHLORIDE 0.9 % IV SOLN
10.0000 mg/kg | Freq: Once | INTRAVENOUS | Status: AC
  Administered 2021-04-01: 800 mg via INTRAVENOUS
  Filled 2021-04-01: qty 32

## 2021-04-01 NOTE — Patient Instructions (Signed)
[facility] ONCOLOG[facility]  Discharge Instructions: Thank you for choosing [facility] to provide your oncology and hematology care.   If you have a lab appointment with the [facility], please go directly to the [facility] and check in at the registration area.   Wear comfortable clothing and clothing appropriate for easy access to any Portacath or PICC line.   We strive to give you quality time with your provider. You may need to reschedule your appointment if you arrive late (15 or more minutes).  Arriving late affects you and other patients whose appointments are after yours.  Also, if you miss three or more appointments without notifying the office, you may be dismissed from the clinic at the provider's discretion.      For prescription refill requests, have your pharmacy contact our office and allow 72 hours for refills to be completed.    Today you received the following chemotherapy and/or immunotherapy agents: Bevacizumab     To help prevent nausea and vomiting after your treatment, we encourage you to take your nausea medication as directed.  BELOW ARE SYMPTOMS THAT SHOULD BE REPORTED IMMEDIATELY: . *FEVER GREATER THAN 100.4 F (38 C) OR HIGHER . *CHILLS OR SWEATING . *NAUSEA AND VOMITING THAT IS NOT CONTROLLED WITH YOUR NAUSEA MEDICATION . *UNUSUAL SHORTNESS OF BREATH . *UNUSUAL BRUISING OR BLEEDING . *URINARY PROBLEMS (pain or burning when urinating, or frequent urination) . *BOWEL PROBLEMS (unusual diarrhea, constipation, pain near the anus) . TENDERNESS IN MOUTH AND THROAT WITH OR WITHOUT PRESENCE OF ULCERS (sore throat, sores in mouth, or a toothache) . UNUSUAL RASH, SWELLING OR PAIN  . UNUSUAL VAGINAL DISCHARGE OR ITCHING   Items with * indicate a potential emergency and should be followed up as soon as possible or go to the Emergency Department if any problems should occur.  Please show the CHEMOTHERAPY ALERT CARD or IMMUNOTHERAPY ALERT  CARD at check-in to the Emergency Department and triage nurse.  Should you have questions after your visit or need to cancel or reschedule your appointment, please contact [facility]  Dept: [phone]  and follow the prompts.  Office hours are 8:00 a.m. to 4:30 p.m. Monday - Friday. Please note that voicemails left after 4:00 p.m. may not be returned until the following business day.  We are closed weekends and major holidays. You have access to a nurse at all times for urgent questions. Please call the main number to the clinic Dept: [phone] and follow the prompts.   For any non-urgent questions, you may also contact your provider using MyChart. We now offer e-Visits for anyone 12 and older to request care online for non-urgent symptoms. For details visit [email or web address].   Also download the MyChart app! Go to the app store, search "MyChart", open the app, select [facility], and log in with your MyChart username and password.  Due to Covid, a mask is required upon entering the hospital/clinic. If you do not have a mask, one will be given to you upon arrival. For doctor visits, patients may have 1 support person aged 43 or older with them. For treatment visits, patients cannot have anyone with them due to current Covid guidelines and our immunocompromised population.

## 2021-04-01 NOTE — Progress Notes (Signed)
Per Dr Mickeal Skinner 04/01/2021 approved for treatment and labs are approved.

## 2021-04-01 NOTE — Progress Notes (Signed)
[place] at [address], [place] [address] [phone]  Interval Evaluation  Date of Service: 04/01/21 Patient Name: Terry Bell Patient MRN: [id number] Patient DOB: February 18, 1956 Provider: Ventura Sellers, MD  Identifying Statement:  Terry Bell is a 65 y.o. female with right temporal glioblastoma   Referring Provider: Martinique, Betty G, MD [place],  [place] [address]  Oncologic History: Oncology History  Glioblastoma multiforme of temporal lobe [facility])  04/19/2020 Surgery   Craniotomy, right temporal resection with Dr. Christella Noa   05/28/2020 - 07/09/2020 Radiation Therapy   IMRT with concurrent Temodar 8m/m2   05/29/2020 -  Chemotherapy    Patient is on Treatment Plan: BRAIN GLIOBLASTOMA CONSOLIDATION TEMOZOLOMIDE DAYS 1-5 Q28 DAYS    Patient is on Antibody Plan: BRAIN GBM BEVACIZUMAB 14D X 6 CYCLES    11/05/2020 -  Chemotherapy    Patient is on Treatment Plan: BRAIN GLIOBLASTOMA CONSOLIDATION TEMOZOLOMIDE DAYS 1-5 Q28 DAYS    Patient is on Antibody Plan: BRAIN GBM BEVACIZUMAB 14D X 6 CYCLES      Biomarkers:  MGMT Methylated.  IDH 1/2 Wild type.  EGFR Not expressed  TERT "Mutated   Interval History:  Terry Bell today for avastin infusion following recent MRI brain.  She and her husband describe clear improvement in recent decline, characterized by fatigue, lethargy, diminished activity, since resuming decadron 82mdaily.  Even a short trial at 32m27maily led to clinical decrement.  Working with PT once per week currently, would like to engage with OT as well.  No longer on hydrocortisone.  Overall no issues or concerns with avastin infusion, tolerability.    Dexamethasone 11/20/20: 4mg85m/22/22: 8mg 48m21/22: 4mg 075m7/22: -  H+P (04/26/20) Patient presented last week with several days of progressive new-onset frontal headache.  This may be have been associated with change in taste, but no  other neurologic complaints.  CNS imaging demonstrated a large right temporal mass.  She underwent resection on 04/19/20 with Dr. CabbelChristella Noadischarged on 6/27.  She has no complaints today aside from some residual pain at the surgical site.  Does complain of "hole in vision" on the left side, but otherwise no neurologic deficits.  Taking decadron 4mg tw28m per day.  Medications: Current Outpatient Medications on File Prior to Visit  Medication Sig Dispense Refill  . acetaminophen (TYLENOL) 325 MG tablet Take 650 mg by mouth every 6 (six) hours as needed. Per pt, not to exceed 2G in a given day    . Cholecalciferol (VITAMIN D) 1000 UNITS capsule Take 1,000 Units by mouth 2 (two) times daily.      . dexamMarland Kitchenthasone (DECADRON) 4 MG tablet Take 1 tablet (4 mg total) by mouth 2 (two) times daily with a meal. 60 tablet 1  . furosemide (LASIX) 20 MG tablet TAKE 1 TABLET (20 MG TOTAL) BY MOUTH DAILY. 30 tablet 1  . levETIRAcetam (KEPPRA) 500 MG tablet Take 1 tablet (500 mg total) by mouth 2 (two) times daily. 60 tablet 3  . methylphenidate (RITALIN) 5 MG tablet Take 1 tablet (5 mg total) by mouth 2 (two) times daily. 60 tablet 0  . pantoprazole (PROTONIX) 40 MG tablet TAKE 1 TABLET (40 MG TOTAL) BY MOUTH DAILY. 30 tablet 3  . Probiotic Product (ALIGN) 4 MG CAPS Take 4 mg by mouth daily.     . rivaroxaban (XARELTO) 20 MG TABS tablet Take 1 tablet (20 mg total) by mouth  daily with supper. 30 tablet 3  . dexamethasone (DECADRON) 1 MG tablet Take 3 tablets (3 mg total) by mouth daily. (Patient not taking: Reported on 04/01/2021) 90 tablet 0  . hydrocortisone (CORTEF) 10 MG tablet Take 2 tablets every morning and 1 tablet every evening (Patient not taking: Reported on 04/01/2021) 90 tablet 3  . ondansetron (ZOFRAN) 8 MG tablet TAKE 1 TABLET BY MOUTH TWO TIMES DAILY AS NEEDED FOR NAUSEA AND VOMITING * MAY TAKE 30-60 MINS PRIOR TO TEMODAR ADMINISTRATION (Patient not taking: No sig reported) 30 tablet 1  . polyethylene  glycol (MIRALAX / GLYCOLAX) 17 g packet Take 17 g by mouth daily. (Patient not taking: No sig reported)    . temozolomide (TEMODAR) 140 MG capsule TAKE 2 CAPSULES (280 MG TOTAL) BY MOUTH DAILY. MAY TAKE ON AN EMPTY STOMACH TO DECREASE NAUSEA & VOMITING. (Patient not taking: No sig reported) 10 capsule 0   No current facility-administered medications on file prior to visit.    Allergies:  Allergies  Allergen Reactions  . Niacin Hives and Rash   Past Medical History:  Past Medical History:  Diagnosis Date  . Arthritis   . Chronic nonalcoholic liver disease   . Complication of anesthesia   . Continuous leakage of urine   . Depression    situational  . Diverticulosis   . OSA (obstructive sleep apnea) 07/22/2016  . PONV (postoperative nausea and vomiting)   . Steatohepatitis   . Tinnitus   . Varicose veins    Past Surgical History:  Past Surgical History:  Procedure Laterality Date  . APPLICATION OF CRANIAL NAVIGATION N/A 04/19/2020   Procedure: APPLICATION OF CRANIAL NAVIGATION;  Surgeon: Ashok Pall, MD;  Location: [facility];  Service: Neurosurgery;  Laterality: N/A;  . arthroscopic left knee sugery    . bilateral bunionectomy    . BREAST BIOPSY Left   . CHOLECYSTECTOMY    . colonsocopy    . CRANIOTOMY Right 04/19/2020   Procedure: Right Temporal craniotomy for tumor resection;  Surgeon: Ashok Pall, MD;  Location: [place];  Service: Neurosurgery;  Laterality: Right;  . debridement and removal and lateral release of torn cartlidge in rt knee    . gum grating    . rt knee arthroscopy    . torn cartledge in rt knee     Social History:  Social History   Socioeconomic History  . Marital status: Married    Spouse name: Not on file  . Number of children: Not on file  . Years of education: Not on file  . Highest education level: Not on file  Occupational History  . 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 Smoking status: Never Smoker  . Smokeless tobacco: Never Used   Substance and Sexual Activity  . Alcohol use: No  . Drug use: No  . Sexual activity: Not on file  Other Topics Concern  . 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 Diabetes Mother   . Liver disease Mother   .  Diabetes Sister   . Liver disease Sister   . Hypertension Other   . Cancer Other   . Hemochromatosis Other   . Cancer Father        Throat  . Hypertension Brother   . Colon cancer Neg Hx   . Breast cancer Neg Hx     Review of Systems: Constitutional: Doesn't report fevers, chills or abnormal weight loss Eyes: Doesn't report blurriness of vision Ears, nose, mouth, throat, and face: Doesn't report sore throat Respiratory: Doesn't report cough, dyspnea or wheezes Cardiovascular: Doesn't report palpitation, chest discomfort  Gastrointestinal:  Doesn't report nausea, constipation, diarrhea GU: Doesn't report incontinence Skin: Doesn't report skin rashes Neurological: Per HPI Musculoskeletal: Doesn't report joint pain Behavioral/Psych: Doesn't report anxiety  Physical Exam: Vitals:   04/01/21 1146  BP: 113/90  Pulse: 71  Resp: 17  Temp: 97.7 F (36.5 C)  SpO2: 97%   KPS: 60. General: Alert, cooperative, pleasant, in no acute distress Head: Normal EENT: No conjunctival injection or scleral icterus.  Lungs: Resp effort normal Cardiac: Regular rate Abdomen: Non-distended abdomen Skin: No rashes cyanosis or petechiae. Extremities: No clubbing or edema  Neurologic Exam: Mental Status: Awake, alert, attentive to examiner. Oriented to self and  environment. Language is fluent with intact comprehension.  Mild motor and sensory neglect.  Cranial Nerves: Visual acuity is grossly normal. Left hemianopia. Extra-ocular movements intact. No ptosis. Face is symmetric Motor: Tone and bulk are normal. Power is 3/5 in left arm and leg, with fine motor dystaxia. Reflexes are symmetric, no pathologic reflexes present.  Sensory: Extinguishes left leg and arm. Gait: Deferred   Labs: I have reviewed the data as listed    Component Value Date/Time   NA 142 03/12/2021 1129   K 4.2 03/12/2021 1129   CL 109 03/12/2021 1129   CO2 23 03/12/2021 1129   GLUCOSE 135 (H) 03/12/2021 1129   BUN 13 03/12/2021 1129   CREATININE 0.67 03/12/2021 1129   CALCIUM 9.7 03/12/2021 1129   PROT 7.0 03/12/2021 1129   ALBUMIN 3.3 (L) 03/12/2021 1129   AST 70 (H) 03/12/2021 1129   ALT 153 (H) 03/12/2021 1129   ALKPHOS 89 03/12/2021 1129   BILITOT 2.2 (H) 03/12/2021 1129   GFRNONAA >60 03/12/2021 1129   GFRAA >60 07/30/2020 1202   Lab Results  Component Value Date   WBC 8.7 04/01/2021   NEUTROABS 7.7 04/01/2021   HGB 16.2 (H) 04/01/2021   HCT 46.5 (H) 04/01/2021   MCV 99.6 04/01/2021   PLT 91 (L) 04/01/2021   Imaging:  [facility] Clinician Interpretation: I have personally reviewed the CNS images as listed.  My interpretation, in the context of the patient's clinical presentation, is stable disease  No results found. Assessment/Plan Glioblastoma multiforme of temporal lobe ([place]) [C71.2]   Terry Bell is clinically stable today.  Labs demonstrate elevation in liver enzymes secondary to concurrent NASH, but improvement in bilirubin levels.  AST/ALT are still below levels reached in March 2022.  We recommended continuing with single agent IV avastin 67m/kg, but changing infusion to q3 weeks because of difficulties with transport.  We understand she has chronic hepatitis, liver enzymes are elevated but not increasing over past month.  Avastin should be held  for the following:  ANC less than 500  Platelets less than 50,000  LFT or creatinine greater than 2x ULN  If clinical concerns/contraindications develop  Due to quality of life considerations, ok to continue decadron at 866mdaily.  Ritalin may be  increased to 13m BID if desired.  Keppra should be continued at 5043mBID, can increase if events recur again.  We ask that DoSHYNIECE SCRIPTEReturn to clinic in 3 weeks following next brain MRI, or sooner as needed.   All questions were answered. The patient knows to call the clinic with any problems, questions or concerns. No barriers to learning were detected.  I have spent a total of 30 minutes of face-to-face and non-face-to-face time, excluding clinical staff time, preparing to see patient, ordering tests and/or medications, counseling the patient, and documenting clinical information in the electronic or other health record    ZaVentura SellersMD Medical Director of Neuro-Oncology CoBradleyt WeGarner6/06/22 12:08 PM

## 2021-04-02 ENCOUNTER — Other Ambulatory Visit (HOSPITAL_COMMUNITY): Payer: Self-pay

## 2021-04-02 ENCOUNTER — Encounter: Payer: Self-pay | Admitting: Internal Medicine

## 2021-04-02 ENCOUNTER — Telehealth: Payer: Self-pay | Admitting: *Deleted

## 2021-04-02 MED ORDER — METHYLPHENIDATE HCL 5 MG PO TABS
5.0000 mg | ORAL_TABLET | Freq: Two times a day (BID) | ORAL | 0 refills | Status: AC
  Filled 2021-04-02 – 2021-04-04 (×2): qty 60, 30d supply, fill #0

## 2021-04-02 NOTE — Telephone Encounter (Signed)
Dr Mickeal Skinner requested OT for patient.  She is already established with [facility]  They will reestablish OT with patient.  She originally was scheduled to get but she declined stating she didn't know she needed it.  They will go back out and reassess.

## 2021-04-02 NOTE — Telephone Encounter (Signed)
Rx refill request

## 2021-04-03 ENCOUNTER — Telehealth: Payer: Self-pay | Admitting: Internal Medicine

## 2021-04-03 NOTE — Telephone Encounter (Signed)
Scheduled per 06/06 los, patient has been called and notified regarding upcoming appointments. Left a voicemail.

## 2021-04-04 ENCOUNTER — Other Ambulatory Visit (HOSPITAL_COMMUNITY): Payer: Self-pay

## 2021-04-04 DIAGNOSIS — G4733 Obstructive sleep apnea (adult) (pediatric): Secondary | ICD-10-CM | POA: Diagnosis not present

## 2021-04-04 DIAGNOSIS — C712 Malignant neoplasm of temporal lobe: Secondary | ICD-10-CM | POA: Diagnosis not present

## 2021-04-04 DIAGNOSIS — K5790 Diverticulosis of intestine, part unspecified, without perforation or abscess without bleeding: Secondary | ICD-10-CM | POA: Diagnosis not present

## 2021-04-04 DIAGNOSIS — M1990 Unspecified osteoarthritis, unspecified site: Secondary | ICD-10-CM | POA: Diagnosis not present

## 2021-04-04 DIAGNOSIS — Z7901 Long term (current) use of anticoagulants: Secondary | ICD-10-CM | POA: Diagnosis not present

## 2021-04-04 DIAGNOSIS — F32A Depression, unspecified: Secondary | ICD-10-CM | POA: Diagnosis not present

## 2021-04-05 DIAGNOSIS — C712 Malignant neoplasm of temporal lobe: Secondary | ICD-10-CM | POA: Diagnosis not present

## 2021-04-05 DIAGNOSIS — Z7901 Long term (current) use of anticoagulants: Secondary | ICD-10-CM | POA: Diagnosis not present

## 2021-04-05 DIAGNOSIS — G4733 Obstructive sleep apnea (adult) (pediatric): Secondary | ICD-10-CM | POA: Diagnosis not present

## 2021-04-05 DIAGNOSIS — F32A Depression, unspecified: Secondary | ICD-10-CM | POA: Diagnosis not present

## 2021-04-05 DIAGNOSIS — M1990 Unspecified osteoarthritis, unspecified site: Secondary | ICD-10-CM | POA: Diagnosis not present

## 2021-04-05 DIAGNOSIS — K5790 Diverticulosis of intestine, part unspecified, without perforation or abscess without bleeding: Secondary | ICD-10-CM | POA: Diagnosis not present

## 2021-04-08 ENCOUNTER — Encounter: Payer: Self-pay | Admitting: Internal Medicine

## 2021-04-09 ENCOUNTER — Other Ambulatory Visit (HOSPITAL_COMMUNITY): Payer: Self-pay

## 2021-04-09 DIAGNOSIS — Z7901 Long term (current) use of anticoagulants: Secondary | ICD-10-CM | POA: Diagnosis not present

## 2021-04-09 DIAGNOSIS — F32A Depression, unspecified: Secondary | ICD-10-CM | POA: Diagnosis not present

## 2021-04-09 DIAGNOSIS — G4733 Obstructive sleep apnea (adult) (pediatric): Secondary | ICD-10-CM | POA: Diagnosis not present

## 2021-04-09 DIAGNOSIS — K5790 Diverticulosis of intestine, part unspecified, without perforation or abscess without bleeding: Secondary | ICD-10-CM | POA: Diagnosis not present

## 2021-04-09 DIAGNOSIS — C712 Malignant neoplasm of temporal lobe: Secondary | ICD-10-CM | POA: Diagnosis not present

## 2021-04-09 DIAGNOSIS — M1990 Unspecified osteoarthritis, unspecified site: Secondary | ICD-10-CM | POA: Diagnosis not present

## 2021-04-10 ENCOUNTER — Other Ambulatory Visit (HOSPITAL_COMMUNITY): Payer: Self-pay

## 2021-04-10 ENCOUNTER — Encounter: Payer: Self-pay | Admitting: Internal Medicine

## 2021-04-10 NOTE — Telephone Encounter (Signed)
I spoke with pts daughter, Terry Bell, who advised that pt has had diarrhea since Sunday night with once incidence of blood who seemed to have resolved until today. Pt had a good soft stool bowl movement but there was a blood clot with it this time (see attached picture in separate message). Terry Bell further shares that pt does not complain of any abdominal pain or discomfort. Lastly, pt was exposed to [facility] on Sunday (sister tested positive) but so far the rest of the family has tested negative.  Discussed with Dr. Mickeal Skinner who recommends pt hold her Xarelto until he see's her in the office on Tuesday.   I have called Terry Bell back and advised as indicated. She also states they will have the pt tested for COVID as well. She was advised to call back if sx worsen or change. She expressed understanding of this information.

## 2021-04-11 ENCOUNTER — Telehealth: Payer: Self-pay | Admitting: *Deleted

## 2021-04-11 DIAGNOSIS — H5347 Heteronymous bilateral field defects: Secondary | ICD-10-CM | POA: Diagnosis not present

## 2021-04-11 DIAGNOSIS — R569 Unspecified convulsions: Secondary | ICD-10-CM | POA: Diagnosis not present

## 2021-04-11 DIAGNOSIS — K7581 Nonalcoholic steatohepatitis (NASH): Secondary | ICD-10-CM | POA: Diagnosis not present

## 2021-04-11 DIAGNOSIS — F4321 Adjustment disorder with depressed mood: Secondary | ICD-10-CM | POA: Diagnosis not present

## 2021-04-11 DIAGNOSIS — K5790 Diverticulosis of intestine, part unspecified, without perforation or abscess without bleeding: Secondary | ICD-10-CM | POA: Diagnosis not present

## 2021-04-11 DIAGNOSIS — M1990 Unspecified osteoarthritis, unspecified site: Secondary | ICD-10-CM | POA: Diagnosis not present

## 2021-04-11 DIAGNOSIS — R32 Unspecified urinary incontinence: Secondary | ICD-10-CM | POA: Diagnosis not present

## 2021-04-11 DIAGNOSIS — C712 Malignant neoplasm of temporal lobe: Secondary | ICD-10-CM | POA: Diagnosis not present

## 2021-04-11 DIAGNOSIS — G4733 Obstructive sleep apnea (adult) (pediatric): Secondary | ICD-10-CM | POA: Diagnosis not present

## 2021-04-11 NOTE — Telephone Encounter (Signed)
Contacted by Cristela Felt with [facility]. She requested renewal of orders for in home physical therapy once a week/9 weeks and an occupational therapy evaluation. Patient previously had order for both. Dr. Mickeal Skinner informed.

## 2021-04-12 ENCOUNTER — Encounter: Payer: Self-pay | Admitting: Internal Medicine

## 2021-04-16 ENCOUNTER — Inpatient Hospital Stay: Payer: 59

## 2021-04-16 ENCOUNTER — Other Ambulatory Visit: Payer: Self-pay

## 2021-04-16 ENCOUNTER — Inpatient Hospital Stay (HOSPITAL_BASED_OUTPATIENT_CLINIC_OR_DEPARTMENT_OTHER): Payer: 59 | Admitting: Internal Medicine

## 2021-04-16 ENCOUNTER — Inpatient Hospital Stay (HOSPITAL_BASED_OUTPATIENT_CLINIC_OR_DEPARTMENT_OTHER): Payer: 59

## 2021-04-16 VITALS — BP 139/92 | HR 62 | Temp 97.6°F | Resp 18 | Ht 66.0 in | Wt 175.0 lb

## 2021-04-16 VITALS — BP 126/96 | HR 60 | Temp 97.6°F | Resp 18

## 2021-04-16 DIAGNOSIS — Z79899 Other long term (current) drug therapy: Secondary | ICD-10-CM | POA: Diagnosis not present

## 2021-04-16 DIAGNOSIS — C712 Malignant neoplasm of temporal lobe: Secondary | ICD-10-CM

## 2021-04-16 DIAGNOSIS — Z7901 Long term (current) use of anticoagulants: Secondary | ICD-10-CM | POA: Diagnosis not present

## 2021-04-16 DIAGNOSIS — Z5112 Encounter for antineoplastic immunotherapy: Secondary | ICD-10-CM | POA: Diagnosis not present

## 2021-04-16 DIAGNOSIS — Z23 Encounter for immunization: Secondary | ICD-10-CM

## 2021-04-16 DIAGNOSIS — Z7952 Long term (current) use of systemic steroids: Secondary | ICD-10-CM | POA: Diagnosis not present

## 2021-04-16 DIAGNOSIS — G4733 Obstructive sleep apnea (adult) (pediatric): Secondary | ICD-10-CM | POA: Diagnosis not present

## 2021-04-16 DIAGNOSIS — Z923 Personal history of irradiation: Secondary | ICD-10-CM | POA: Diagnosis not present

## 2021-04-16 DIAGNOSIS — K7581 Nonalcoholic steatohepatitis (NASH): Secondary | ICD-10-CM | POA: Diagnosis not present

## 2021-04-16 DIAGNOSIS — Z9221 Personal history of antineoplastic chemotherapy: Secondary | ICD-10-CM | POA: Diagnosis not present

## 2021-04-16 LAB — CBC WITH DIFFERENTIAL (CANCER CENTER ONLY)
Abs Immature Granulocytes: 0.34 10*3/uL — ABNORMAL HIGH (ref 0.00–0.07)
Basophils Absolute: 0 10*3/uL (ref 0.0–0.1)
Basophils Relative: 0 %
Eosinophils Absolute: 0 10*3/uL (ref 0.0–0.5)
Eosinophils Relative: 0 %
HCT: 45.2 % (ref 36.0–46.0)
Hemoglobin: 16 g/dL — ABNORMAL HIGH (ref 12.0–15.0)
Immature Granulocytes: 4 %
Lymphocytes Relative: 6 %
Lymphs Abs: 0.5 10*3/uL — ABNORMAL LOW (ref 0.7–4.0)
MCH: 34.6 pg — ABNORMAL HIGH (ref 26.0–34.0)
MCHC: 35.4 g/dL (ref 30.0–36.0)
MCV: 97.8 fL (ref 80.0–100.0)
Monocytes Absolute: 0.4 10*3/uL (ref 0.1–1.0)
Monocytes Relative: 6 %
Neutro Abs: 6.4 10*3/uL (ref 1.7–7.7)
Neutrophils Relative %: 84 %
Platelet Count: 105 10*3/uL — ABNORMAL LOW (ref 150–400)
RBC: 4.62 MIL/uL (ref 3.87–5.11)
RDW: 12.9 % (ref 11.5–15.5)
WBC Count: 7.7 10*3/uL (ref 4.0–10.5)
nRBC: 0 % (ref 0.0–0.2)

## 2021-04-16 LAB — CMP (CANCER CENTER ONLY)
ALT: 249 U/L — ABNORMAL HIGH (ref 0–44)
AST: 106 U/L — ABNORMAL HIGH (ref 15–41)
Albumin: 3.4 g/dL — ABNORMAL LOW (ref 3.5–5.0)
Alkaline Phosphatase: 88 U/L (ref 38–126)
Anion gap: 7 (ref 5–15)
BUN: 14 mg/dL (ref 8–23)
CO2: 22 mmol/L (ref 22–32)
Calcium: 9.1 mg/dL (ref 8.9–10.3)
Chloride: 108 mmol/L (ref 98–111)
Creatinine: 0.52 mg/dL (ref 0.44–1.00)
GFR, Estimated: >60 mL/min (ref >60)
Glucose, Bld: 134 mg/dL — ABNORMAL HIGH (ref 70–99)
Potassium: 4.1 mmol/L (ref 3.5–5.1)
Sodium: 137 mmol/L (ref 135–145)
Total Bilirubin: 1.2 mg/dL (ref 0.3–1.2)
Total Protein: 6.5 g/dL (ref 6.5–8.1)

## 2021-04-16 LAB — TOTAL PROTEIN, URINE DIPSTICK: Protein, ur: NEGATIVE mg/dL

## 2021-04-16 MED ORDER — SODIUM CHLORIDE 0.9 % IV SOLN
Freq: Once | INTRAVENOUS | Status: AC
  Filled 2021-04-16: qty 250

## 2021-04-16 MED ORDER — SODIUM CHLORIDE 0.9 % IV SOLN
10.0000 mg/kg | Freq: Once | INTRAVENOUS | Status: AC
  Administered 2021-04-16: 800 mg via INTRAVENOUS
  Filled 2021-04-16: qty 32

## 2021-04-16 NOTE — Progress Notes (Signed)
[place] at [place], [place] [address] [phone]  Interval Evaluation  Date of Service: 04/16/21 Patient Name: Terry Bell Patient MRN: [id number] Patient DOB: 05-Mar-1956 Provider: Ventura Sellers, MD  Identifying Statement:  Terry Bell is a 65 y.o. female with right temporal glioblastoma   Referring Provider: Martinique, Betty G, MD [place],  [place] [address]  Oncologic History: Oncology History  Glioblastoma multiforme of temporal lobe [facility])  04/19/2020 Surgery   Craniotomy, right temporal resection with Dr. Christella Noa   05/28/2020 - 07/09/2020 Radiation Therapy   IMRT with concurrent Temodar 42m/m2   05/29/2020 -  Chemotherapy    Patient is on Treatment Plan: BRAIN GLIOBLASTOMA CONSOLIDATION TEMOZOLOMIDE DAYS 1-5 Q28 DAYS    Patient is on Antibody Plan: BRAIN GBM BEVACIZUMAB 14D X 6 CYCLES     11/05/2020 -  Chemotherapy    Patient is on Treatment Plan: BRAIN GLIOBLASTOMA CONSOLIDATION TEMOZOLOMIDE DAYS 1-5 Q28 DAYS    Patient is on Antibody Plan: BRAIN GBM BEVACIZUMAB 14D X 6 CYCLES       Biomarkers:  MGMT Methylated.  IDH 1/2 Wild type.  EGFR Not expressed  TERT "Mutated   Interval History:  Terry THIEMANNpresents today for avastin infusion.  She describes several days of bloody stool and diarrhea this past week.  Xarelto was held, and since then (4 days) she has not had further blood in the toilet.  Otherwise no significant clinical changes.  She continues on decadron at 851mdaily as prior.  Working with PT and OT now.  Overall no issues or concerns with avastin infusion, tolerability.    Dexamethasone 11/20/20: 17m16m2/22/22: 8mg43m/21/22: 17mg 49m17/22: -  H+P (04/26/20) Patient presented last week with several days of progressive new-onset frontal headache.  This may be have been associated with change in taste, but no other neurologic complaints.  CNS imaging demonstrated a large right  temporal mass.  She underwent resection on 04/19/20 with Dr. CabbeChristella Noa discharged on 6/27.  She has no complaints today aside from some residual pain at the surgical site.  Does complain of "hole in vision" on the left side, but otherwise no neurologic deficits.  Taking decadron 17mg t117me per day.  Medications: Current Outpatient Medications on File Prior to Visit  Medication Sig Dispense Refill   dexamethasone (DECADRON) 4 MG tablet Take 1 tablet (4 mg total) by mouth 2 (two) times daily with a meal. 60 tablet 1   levETIRAcetam (KEPPRA) 500 MG tablet Take 1 tablet (500 mg total) by mouth 2 (two) times daily. 60 tablet 3   methylphenidate (RITALIN) 5 MG tablet Take 1 tablet (5 mg total) by mouth 2 (two) times daily. 60 tablet 0   acetaminophen (TYLENOL) 325 MG tablet Take 650 mg by mouth every 6 (six) hours as needed. Per pt, not to exceed 2G in a given day (Patient not taking: Reported on 04/16/2021)     Cholecalciferol (VITAMIN D) 1000 UNITS capsule Take 1,000 Units by mouth 2 (two) times daily.   (Patient not taking: Reported on 04/16/2021)     dexamethasone (DECADRON) 1 MG tablet Take 3 tablets (3 mg total) by mouth daily. (Patient not taking: No sig reported) 90 tablet 0   furosemide (LASIX) 20 MG tablet TAKE 1 TABLET (20 MG TOTAL) BY MOUTH DAILY. (Patient not taking: Reported on 04/16/2021) 30 tablet 1   hydrocortisone (CORTEF) 10 MG tablet Take 2 tablets every morning  and 1 tablet every evening (Patient not taking: No sig reported) 90 tablet 3   ondansetron (ZOFRAN) 8 MG tablet TAKE 1 TABLET BY MOUTH TWO TIMES DAILY AS NEEDED FOR NAUSEA AND VOMITING * MAY TAKE 30-60 MINS PRIOR TO TEMODAR ADMINISTRATION (Patient not taking: No sig reported) 30 tablet 1   pantoprazole (PROTONIX) 40 MG tablet TAKE 1 TABLET (40 MG TOTAL) BY MOUTH DAILY. (Patient not taking: Reported on 04/16/2021) 30 tablet 3   polyethylene glycol (MIRALAX / GLYCOLAX) 17 g packet Take 17 g by mouth daily. (Patient not taking: No sig  reported)     Probiotic Product (ALIGN) 4 MG CAPS Take 4 mg by mouth daily.  (Patient not taking: Reported on 04/16/2021)     rivaroxaban (XARELTO) 20 MG TABS tablet Take 1 tablet (20 mg total) by mouth daily with supper. (Patient not taking: Reported on 04/16/2021) 30 tablet 3   temozolomide (TEMODAR) 140 MG capsule TAKE 2 CAPSULES (280 MG TOTAL) BY MOUTH DAILY. MAY TAKE ON AN EMPTY STOMACH TO DECREASE NAUSEA & VOMITING. (Patient not taking: No sig reported) 10 capsule 0   No current facility-administered medications on file prior to visit.    Allergies:  Allergies  Allergen Reactions   Niacin Hives and Rash   Past Medical History:  Past Medical History:  Diagnosis Date   Arthritis    Chronic nonalcoholic liver disease    Complication of anesthesia    Continuous leakage of urine    Depression    situational   Diverticulosis    OSA (obstructive sleep apnea) 07/22/2016   PONV (postoperative nausea and vomiting)    Steatohepatitis    Tinnitus    Varicose veins    Past Surgical History:  Past Surgical History:  Procedure Laterality Date   APPLICATION OF CRANIAL NAVIGATION N/A 04/19/2020   Procedure: APPLICATION OF CRANIAL NAVIGATION;  Surgeon: Ashok Pall, MD;  Location: [facility];  Service: Neurosurgery;  Laterality: N/A;   arthroscopic left knee sugery     bilateral bunionectomy     BREAST BIOPSY Left    CHOLECYSTECTOMY     colonsocopy     CRANIOTOMY Right 04/19/2020   Procedure: Right Temporal craniotomy for tumor resection;  Surgeon: Ashok Pall, MD;  Location: [facility];  Service: Neurosurgery;  Laterality: Right;   debridement and removal and lateral release of torn cartlidge in rt knee     gum grating     rt knee arthroscopy     torn cartledge in rt knee     Social History:  Social History   Socioeconomic History   Marital status: Married    Spouse name: Not on file   Number of children: Not on file   Years of education: Not on file   Highest education level: Not on  file  Occupational History   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  Tobacco Use   Smoking status: Never   Smokeless tobacco: Never  Substance and Sexual Activity   Alcohol use: No   Drug use: No   Sexual activity: Not on file  Other Topics Concern   Not on file  Social History Narrative   -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 - [employer]      Regular exercise, healthy diet   Social Determinants of Health   Financial Resource Strain: Not on file  Food Insecurity: Not on file  Transportation Needs: Not on file  Physical Activity: Not on file  Stress: Not on file  Social Connections: Not on file  Intimate  Partner Violence: Not on file   Family History:  Family History  Problem Relation Age of Onset   Diabetes Mother    Liver disease Mother    Diabetes Sister    Liver disease Sister    Hypertension Other    Cancer Other    Hemochromatosis Other    Cancer Father        Throat   Hypertension Brother    Colon cancer Neg Hx    Breast cancer Neg Hx     Review of Systems: Constitutional: Doesn't report fevers, chills or abnormal weight loss Eyes: Doesn't report blurriness of vision Ears, nose, mouth, throat, and face: Doesn't report sore throat Respiratory: Doesn't report cough, dyspnea or wheezes Cardiovascular: Doesn't report palpitation, chest discomfort  Gastrointestinal:  Doesn't report nausea, constipation, diarrhea GU: Doesn't report incontinence Skin: Doesn't report skin rashes Neurological: Per HPI Musculoskeletal: Doesn't report joint pain Behavioral/Psych: Doesn't report anxiety  Physical Exam: Vitals:   04/16/21 0850  BP: (!) 139/92  Pulse: 62  Resp: 18  Temp: 97.6 F (36.4 C)  SpO2: 93%   KPS: 60. General: Alert, cooperative, pleasant, in no acute distress Head: Normal EENT: No conjunctival injection or scleral icterus.  Lungs: Resp effort normal Cardiac: Regular rate Abdomen: Non-distended abdomen Skin: No rashes cyanosis or petechiae. Extremities: No  clubbing or edema  Neurologic Exam: Mental Status: Awake, alert, attentive to examiner. Oriented to self and environment. Language is fluent with intact comprehension.  Mild motor and sensory neglect.  Cranial Nerves: Visual acuity is grossly normal. Left hemianopia. Extra-ocular movements intact. No ptosis. Face is symmetric Motor: Tone and bulk are normal. Power is 3/5 in left arm and leg, with fine motor dystaxia. Reflexes are symmetric, no pathologic reflexes present.  Sensory: Extinguishes left leg and arm. Gait: Deferred   Labs: I have reviewed the data as listed    Component Value Date/Time   NA 138 04/01/2021 1118   K 4.3 04/01/2021 1118   CL 107 04/01/2021 1118   CO2 21 (L) 04/01/2021 1118   GLUCOSE 148 (H) 04/01/2021 1118   BUN 14 04/01/2021 1118   CREATININE 0.63 04/01/2021 1118   CALCIUM 9.6 04/01/2021 1118   PROT 6.6 04/01/2021 1118   ALBUMIN 3.3 (L) 04/01/2021 1118   AST 141 (H) 04/01/2021 1118   ALT 322 (HH) 04/01/2021 1118   ALKPHOS 90 04/01/2021 1118   BILITOT 1.6 (H) 04/01/2021 1118   GFRNONAA >60 04/01/2021 1118   GFRAA >60 07/30/2020 1202   Lab Results  Component Value Date   WBC 7.7 04/16/2021   NEUTROABS 6.4 04/16/2021   HGB 16.0 (H) 04/16/2021   HCT 45.2 04/16/2021   MCV 97.8 04/16/2021   PLT 105 (L) 04/16/2021    Assessment/Plan Glioblastoma multiforme of temporal lobe ([place]) [C71.2]   Terry Bell is clinically stable today.  Hematochezia has resolved with holding anticoagulation.    We recommended continuing with single agent IV avastin 16m/kg, now dosed q3 weeks.  We understand she has chronic hepatitis, liver enzymes are elevated but not increasing over past month.  Avastin should be held for the following:  ANC less than 500  Platelets less than 50,000  LFT or creatinine greater than 2x ULN  If clinical concerns/contraindications develop  Will con't to hold Xarelto until next infusion; could consider resuming at lower dose if  remains asymptomatic.  Due to quality of life considerations, ok to continue decadron at 8860mdaily.  Ritalin will con't at 60m48mID.  Keppra  should be continued at 55m BID.  We ask that Terry ZEHRINGreturn to clinic in 3 weeks following next brain MRI, or sooner as needed.   All questions were answered. The patient knows to call the clinic with any problems, questions or concerns. No barriers to learning were detected.  I have spent a total of 30 minutes of face-to-face and non-face-to-face time, excluding clinical staff time, preparing to see patient, ordering tests and/or medications, counseling the patient, and documenting clinical information in the electronic or other health record    ZVentura Sellers MD Medical Director of Neuro-Oncology CWallingford Endoscopy Center LLCat W[place]06/21/22 9:04 AM

## 2021-04-16 NOTE — Patient Instructions (Signed)
[facility]  Discharge Instructions: Thank you for choosing [facility] to provide your oncology and hematology care.   If you have a lab appointment with the [facility], please go directly to the [facility] and check in at the registration area.   Wear comfortable clothing and clothing appropriate for easy access to any Portacath or PICC line.   We strive to give you quality time with your provider. You may need to reschedule your appointment if you arrive late (15 or more minutes).  Arriving late affects you and other patients whose appointments are after yours.  Also, if you miss three or more appointments without notifying the office, you may be dismissed from the clinic at the provider's discretion.      For prescription refill requests, have your pharmacy contact our office and allow 72 hours for refills to be completed.    Today you received the following chemotherapy and/or immunotherapy agents: Bevacizumab     To help prevent nausea and vomiting after your treatment, we encourage you to take your nausea medication as directed.  BELOW ARE SYMPTOMS THAT SHOULD BE REPORTED IMMEDIATELY: *FEVER GREATER THAN 100.4 F (38 C) OR HIGHER *CHILLS OR SWEATING *NAUSEA AND VOMITING THAT IS NOT CONTROLLED WITH YOUR NAUSEA MEDICATION *UNUSUAL SHORTNESS OF BREATH *UNUSUAL BRUISING OR BLEEDING *URINARY PROBLEMS (pain or burning when urinating, or frequent urination) *BOWEL PROBLEMS (unusual diarrhea, constipation, pain near the anus) TENDERNESS IN MOUTH AND THROAT WITH OR WITHOUT PRESENCE OF ULCERS (sore throat, sores in mouth, or a toothache) UNUSUAL RASH, SWELLING OR PAIN  UNUSUAL VAGINAL DISCHARGE OR ITCHING   Items with * indicate a potential emergency and should be followed up as soon as possible or go to the Emergency Department if any problems should occur.  Please show the CHEMOTHERAPY ALERT CARD or IMMUNOTHERAPY ALERT CARD at check-in to  the Emergency Department and triage nurse.  Should you have questions after your visit or need to cancel or reschedule your appointment, please contact [facility]  Dept: [phone]  and follow the prompts.  Office hours are 8:00 a.m. to 4:30 p.m. Monday - Friday. Please note that voicemails left after 4:00 p.m. may not be returned until the following business day.  We are closed weekends and major holidays. You have access to a nurse at all times for urgent questions. Please call the main number to the clinic Dept: [phone] and follow the prompts.   For any non-urgent questions, you may also contact your provider using MyChart. We now offer e-Visits for anyone 81 and older to request care online for non-urgent symptoms. For details visit [email or web address].   Also download the MyChart app! Go to the app store, search "MyChart", open the app, select [facility], and log in with your MyChart username and password.  Due to Covid, a mask is required upon entering the hospital/clinic. If you do not have a mask, one will be given to you upon arrival. For doctor visits, patients may have 1 support person aged 72 or older with them. For treatment visits, patients cannot have anyone with them due to current Covid guidelines and our immunocompromised population.

## 2021-04-16 NOTE — Progress Notes (Signed)
   Covid-19 Vaccination Clinic  Name:  Terry Bell    MRN: [id number] DOB: 1956-10-14  04/16/2021  Terry Bell was observed post Covid-19 immunization for 15 minutes without incident. She was provided with Vaccine Information Sheet and instruction to access the V-Safe system.   Terry Bell was instructed to call 911 with any severe reactions post vaccine: Difficulty breathing  Swelling of face and throat  A fast heartbeat  A bad rash all over body  Dizziness and weakness   Immunizations Administered     Name Date Dose VIS Date Route   PFIZER Comrnaty(Gray TOP) Covid-19 Vaccine 04/16/2021 10:26 AM 0.3 mL 10/04/2020 Intramuscular   Manufacturer: [employer]   Lot: [id number]   [place]: [phone]

## 2021-04-17 ENCOUNTER — Other Ambulatory Visit: Payer: Self-pay | Admitting: Internal Medicine

## 2021-04-18 ENCOUNTER — Other Ambulatory Visit (HOSPITAL_COMMUNITY): Payer: Self-pay

## 2021-04-18 ENCOUNTER — Encounter: Payer: Self-pay | Admitting: Internal Medicine

## 2021-04-18 DIAGNOSIS — K5790 Diverticulosis of intestine, part unspecified, without perforation or abscess without bleeding: Secondary | ICD-10-CM | POA: Diagnosis not present

## 2021-04-18 DIAGNOSIS — G4733 Obstructive sleep apnea (adult) (pediatric): Secondary | ICD-10-CM | POA: Diagnosis not present

## 2021-04-18 DIAGNOSIS — C712 Malignant neoplasm of temporal lobe: Secondary | ICD-10-CM | POA: Diagnosis not present

## 2021-04-18 DIAGNOSIS — H5347 Heteronymous bilateral field defects: Secondary | ICD-10-CM | POA: Diagnosis not present

## 2021-04-18 DIAGNOSIS — M1990 Unspecified osteoarthritis, unspecified site: Secondary | ICD-10-CM | POA: Diagnosis not present

## 2021-04-18 DIAGNOSIS — K7581 Nonalcoholic steatohepatitis (NASH): Secondary | ICD-10-CM | POA: Diagnosis not present

## 2021-04-18 DIAGNOSIS — R32 Unspecified urinary incontinence: Secondary | ICD-10-CM | POA: Diagnosis not present

## 2021-04-18 DIAGNOSIS — R569 Unspecified convulsions: Secondary | ICD-10-CM | POA: Diagnosis not present

## 2021-04-18 DIAGNOSIS — F4321 Adjustment disorder with depressed mood: Secondary | ICD-10-CM | POA: Diagnosis not present

## 2021-04-18 MED ORDER — LEVETIRACETAM 500 MG PO TABS
500.0000 mg | ORAL_TABLET | Freq: Two times a day (BID) | ORAL | 3 refills | Status: DC
  Filled 2021-04-18: qty 60, 30d supply, fill #0

## 2021-04-19 DIAGNOSIS — G4733 Obstructive sleep apnea (adult) (pediatric): Secondary | ICD-10-CM | POA: Diagnosis not present

## 2021-04-19 DIAGNOSIS — H5347 Heteronymous bilateral field defects: Secondary | ICD-10-CM | POA: Diagnosis not present

## 2021-04-19 DIAGNOSIS — M1990 Unspecified osteoarthritis, unspecified site: Secondary | ICD-10-CM | POA: Diagnosis not present

## 2021-04-19 DIAGNOSIS — K7581 Nonalcoholic steatohepatitis (NASH): Secondary | ICD-10-CM | POA: Diagnosis not present

## 2021-04-19 DIAGNOSIS — R569 Unspecified convulsions: Secondary | ICD-10-CM | POA: Diagnosis not present

## 2021-04-19 DIAGNOSIS — F4321 Adjustment disorder with depressed mood: Secondary | ICD-10-CM | POA: Diagnosis not present

## 2021-04-19 DIAGNOSIS — R32 Unspecified urinary incontinence: Secondary | ICD-10-CM | POA: Diagnosis not present

## 2021-04-19 DIAGNOSIS — K5790 Diverticulosis of intestine, part unspecified, without perforation or abscess without bleeding: Secondary | ICD-10-CM | POA: Diagnosis not present

## 2021-04-19 DIAGNOSIS — C712 Malignant neoplasm of temporal lobe: Secondary | ICD-10-CM | POA: Diagnosis not present

## 2021-04-22 ENCOUNTER — Encounter: Payer: Self-pay | Admitting: Internal Medicine

## 2021-04-22 ENCOUNTER — Other Ambulatory Visit (HOSPITAL_COMMUNITY): Payer: Self-pay

## 2021-04-25 ENCOUNTER — Other Ambulatory Visit: Payer: Self-pay | Admitting: Radiation Therapy

## 2021-04-28 ENCOUNTER — Encounter: Payer: Self-pay | Admitting: Internal Medicine

## 2021-04-30 ENCOUNTER — Encounter: Payer: Self-pay | Admitting: *Deleted

## 2021-04-30 ENCOUNTER — Ambulatory Visit: Payer: 59 | Admitting: Internal Medicine

## 2021-04-30 ENCOUNTER — Other Ambulatory Visit: Payer: 59

## 2021-05-01 ENCOUNTER — Ambulatory Visit: Payer: 59

## 2021-05-01 ENCOUNTER — Encounter: Payer: Self-pay | Admitting: Internal Medicine

## 2021-05-02 ENCOUNTER — Other Ambulatory Visit: Payer: Self-pay | Admitting: Internal Medicine

## 2021-05-02 ENCOUNTER — Other Ambulatory Visit (HOSPITAL_COMMUNITY): Payer: Self-pay

## 2021-05-02 MED ORDER — CIPROFLOXACIN HCL 500 MG PO TABS
500.0000 mg | ORAL_TABLET | Freq: Two times a day (BID) | ORAL | 0 refills | Status: AC
  Filled 2021-05-02: qty 14, 7d supply, fill #0

## 2021-05-02 MED ORDER — TRAMADOL HCL 50 MG PO TABS
50.0000 mg | ORAL_TABLET | Freq: Four times a day (QID) | ORAL | 0 refills | Status: AC | PRN
  Filled 2021-05-02: qty 90, 23d supply, fill #0

## 2021-05-03 ENCOUNTER — Other Ambulatory Visit: Payer: Self-pay

## 2021-05-03 ENCOUNTER — Ambulatory Visit (HOSPITAL_COMMUNITY): Payer: 59

## 2021-05-03 ENCOUNTER — Ambulatory Visit (HOSPITAL_COMMUNITY)
Admission: RE | Admit: 2021-05-03 | Discharge: 2021-05-03 | Disposition: A | Discharge status: Home / Self Care | Payer: 59 | Source: Ambulatory Visit | Attending: Internal Medicine | Admitting: Internal Medicine

## 2021-05-03 DIAGNOSIS — C719 Malignant neoplasm of brain, unspecified: Secondary | ICD-10-CM | POA: Diagnosis not present

## 2021-05-03 DIAGNOSIS — C712 Malignant neoplasm of temporal lobe: Secondary | ICD-10-CM | POA: Insufficient documentation

## 2021-05-03 MED ORDER — GADOBUTROL 1 MMOL/ML IV SOLN
8.0000 mL | Freq: Once | INTRAVENOUS | Status: AC | PRN
  Administered 2021-05-03: 8 mL via INTRAVENOUS

## 2021-05-05 ENCOUNTER — Encounter: Payer: Self-pay | Admitting: Internal Medicine

## 2021-05-06 ENCOUNTER — Inpatient Hospital Stay: Payer: 59 | Attending: Internal Medicine | Admitting: Internal Medicine

## 2021-05-06 ENCOUNTER — Other Ambulatory Visit (HOSPITAL_COMMUNITY): Payer: Self-pay

## 2021-05-06 DIAGNOSIS — G4733 Obstructive sleep apnea (adult) (pediatric): Secondary | ICD-10-CM | POA: Diagnosis not present

## 2021-05-06 DIAGNOSIS — G40909 Epilepsy, unspecified, not intractable, without status epilepticus: Secondary | ICD-10-CM | POA: Diagnosis not present

## 2021-05-06 DIAGNOSIS — E441 Mild protein-calorie malnutrition: Secondary | ICD-10-CM | POA: Diagnosis not present

## 2021-05-06 DIAGNOSIS — K7581 Nonalcoholic steatohepatitis (NASH): Secondary | ICD-10-CM | POA: Diagnosis not present

## 2021-05-06 DIAGNOSIS — C712 Malignant neoplasm of temporal lobe: Secondary | ICD-10-CM

## 2021-05-06 DIAGNOSIS — F32A Depression, unspecified: Secondary | ICD-10-CM | POA: Diagnosis not present

## 2021-05-06 DIAGNOSIS — M1990 Unspecified osteoarthritis, unspecified site: Secondary | ICD-10-CM | POA: Diagnosis not present

## 2021-05-06 DIAGNOSIS — Z8719 Personal history of other diseases of the digestive system: Secondary | ICD-10-CM | POA: Diagnosis not present

## 2021-05-06 MED ORDER — LEVETIRACETAM 100 MG/ML PO SOLN
500.0000 mg | Freq: Two times a day (BID) | ORAL | 3 refills | Status: AC
  Filled 2021-05-06: qty 473, 47d supply, fill #0

## 2021-05-07 ENCOUNTER — Ambulatory Visit: Payer: 59 | Admitting: Internal Medicine

## 2021-05-07 ENCOUNTER — Other Ambulatory Visit: Payer: 59

## 2021-05-07 ENCOUNTER — Ambulatory Visit: Payer: 59

## 2021-05-07 ENCOUNTER — Encounter: Payer: Self-pay | Admitting: Internal Medicine

## 2021-05-13 ENCOUNTER — Ambulatory Visit: Payer: 59 | Admitting: Internal Medicine

## 2021-05-13 ENCOUNTER — Other Ambulatory Visit: Payer: 59

## 2021-05-13 ENCOUNTER — Ambulatory Visit: Payer: 59

## 2021-05-20 ENCOUNTER — Ambulatory Visit: Payer: 59 | Admitting: Internal Medicine

## 2021-05-20 ENCOUNTER — Ambulatory Visit: Payer: 59

## 2021-05-20 ENCOUNTER — Other Ambulatory Visit: Payer: 59

## 2021-05-27 NOTE — Progress Notes (Signed)
I connected with Terry Bell on 05/11/2021 at 10:00 AM EDT by telephone visit and verified that I am speaking with the correct person using two identifiers.  I discussed the limitations, risks, security and privacy concerns of performing an evaluation and management service by telemedicine and the availability of in-person appointments. I also discussed with the patient that there may be a patient responsible charge related to this service. The patient expressed understanding and agreed to proceed.  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  She is no longer ambulatory at all.  Requiring direct support with all activities of daily living.  Transporting to MRI this weekend was extremely difficult and frustrating for her and family.  She is complaining now of nausea/vomiting with food intake, struggling to swallow pills Observations:  Patient minimal participant on objective exam, history and reporting provided by family  Imaging:  [facility] Clinician Interpretation: I have personally reviewed the CNS images as listed.  My interpretation, in the context of the patient's clinical presentation, is progressive disease  MR BRAIN W WO CONTRAST  Result Date: 04/26/2021 CLINICAL DATA:  Glioblastoma, follow-up EXAM: MRI HEAD WITHOUT AND WITH CONTRAST TECHNIQUE: Multiplanar, multiecho pulse sequences of the brain and surrounding structures were obtained without and with intravenous contrast. CONTRAST:  11m GADAVIST GADOBUTROL 1 MMOL/ML IV SOLN COMPARISON:  Multiple priors, most recent February 21, 2021 FINDINGS: Brain: No significant change in areas of enhancement of irregular lesion centered within the right parietotemporal lobes, noting that there is  significant intrinsic T1 shortening. Soft tissue extending into the right lateral ventricle is again identified particularly within the temporal horn. There is no new enhancement. Abnormal T2 FLAIR hyperintensity in the right cerebral hemisphere has increased. For example, along the precentral gyrus (series 11, image 39), superior frontal gyrus (image 36), and left periventricular white matter (image 31). There is no acute infarction. Ventricles are stable in size. No significant mass effect. Vascular: Major vessel flow voids at the skull base are preserved. Skull and upper cervical spine: Normal marrow signal is preserved. Sinuses/Orbits: Paranasal sinuses are aerated. Orbits are unremarkable. Other: Sella is unremarkable.  Mastoid air cells are clear. IMPRESSION: Since 02/21/2021, increase in extent of right cerebral abnormal T2 FLAIR hyperintensity suspicious for progression of nonenhancing tumor. Otherwise stable appearance. Electronically Signed   By: PMacy MisM.D.   On: 05/02/2021 08:41    Assessment and Plan: Gli[place]iforme [place] temporal [place]  Terry NORDMEYERpresents today with primarily clinical progression of disease.  Her decline has been fairly swift in recent days, and quality of life is poor.  Patient and family do not desire any further medicalization at this time, only supportive care through hospice.  [facility] will [facility].    Placed order for liquid Keppra 5024mBID, they will also continue the decadron.    Follow Up Instructions:  We are happy to remain involved as primary physician if needed or preferred by patient and family  I discussed the assessment and treatment plan with the patient.  The patient was provided an opportunity to ask questions and all were answered.  The patient agreed with the plan and demonstrated understanding of the instructions.  The patient was advised to call back or seek an  in-person evaluation if the symptoms worsen or if the condition fails to improve as anticipated.  I provided 5-10 minutes of non-face-to-face time during this enocunter.  Ventura Sellers, MD   I provided 25 minutes of non face-to-face telephone visit time during this encounter, and > 50% was spent counseling as documented under my assessment & plan.

## 2021-05-27 DEATH — deceased
